# Patient Record
Sex: Female | Born: 1975 | Race: White | Hispanic: No | Marital: Married | Smoking: Never smoker
Health system: Southern US, Community
[De-identification: ages and names within clinical notes are randomized; demographics above are authoritative.]

## PROBLEM LIST (undated history)

## (undated) ENCOUNTER — Inpatient Hospital Stay (HOSPITAL_COMMUNITY): Payer: Self-pay

## (undated) DIAGNOSIS — F32A Depression, unspecified: Secondary | ICD-10-CM

## (undated) DIAGNOSIS — K219 Gastro-esophageal reflux disease without esophagitis: Secondary | ICD-10-CM

## (undated) DIAGNOSIS — D649 Anemia, unspecified: Secondary | ICD-10-CM

## (undated) DIAGNOSIS — G43909 Migraine, unspecified, not intractable, without status migrainosus: Secondary | ICD-10-CM

## (undated) DIAGNOSIS — J111 Influenza due to unidentified influenza virus with other respiratory manifestations: Secondary | ICD-10-CM

## (undated) DIAGNOSIS — IMO0001 Reserved for inherently not codable concepts without codable children: Secondary | ICD-10-CM

## (undated) DIAGNOSIS — R112 Nausea with vomiting, unspecified: Secondary | ICD-10-CM

## (undated) DIAGNOSIS — O4400 Placenta previa specified as without hemorrhage, unspecified trimester: Secondary | ICD-10-CM

## (undated) DIAGNOSIS — F419 Anxiety disorder, unspecified: Secondary | ICD-10-CM

## (undated) DIAGNOSIS — G950 Syringomyelia and syringobulbia: Secondary | ICD-10-CM

## (undated) DIAGNOSIS — K589 Irritable bowel syndrome without diarrhea: Secondary | ICD-10-CM

## (undated) DIAGNOSIS — F329 Major depressive disorder, single episode, unspecified: Secondary | ICD-10-CM

## (undated) DIAGNOSIS — B019 Varicella without complication: Secondary | ICD-10-CM

## (undated) DIAGNOSIS — J020 Streptococcal pharyngitis: Secondary | ICD-10-CM

## (undated) DIAGNOSIS — R9431 Abnormal electrocardiogram [ECG] [EKG]: Secondary | ICD-10-CM

## (undated) DIAGNOSIS — R87619 Unspecified abnormal cytological findings in specimens from cervix uteri: Secondary | ICD-10-CM

## (undated) DIAGNOSIS — R51 Headache: Secondary | ICD-10-CM

## (undated) DIAGNOSIS — Z9889 Other specified postprocedural states: Secondary | ICD-10-CM

## (undated) HISTORY — DX: Unspecified abnormal cytological findings in specimens from cervix uteri: R87.619

## (undated) HISTORY — DX: Irritable bowel syndrome, unspecified: K58.9

## (undated) HISTORY — DX: Migraine, unspecified, not intractable, without status migrainosus: G43.909

## (undated) HISTORY — PX: WISDOM TOOTH EXTRACTION: SHX21

## (undated) HISTORY — DX: Varicella without complication: B01.9

---
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
## 1997-10-18 ENCOUNTER — Encounter: Payer: Self-pay | Admitting: Emergency Medicine

## 1997-10-18 ENCOUNTER — Emergency Department (HOSPITAL_COMMUNITY): Admission: EM | Admit: 1997-10-18 | Discharge: 1997-10-18 | Payer: Self-pay | Admitting: Emergency Medicine

## 1998-03-11 ENCOUNTER — Other Ambulatory Visit: Admission: RE | Admit: 1998-03-11 | Discharge: 1998-03-11 | Payer: Self-pay | Admitting: Obstetrics and Gynecology

## 1998-09-29 ENCOUNTER — Other Ambulatory Visit: Admission: RE | Admit: 1998-09-29 | Discharge: 1998-09-29 | Payer: Self-pay | Admitting: Obstetrics and Gynecology

## 1998-11-18 ENCOUNTER — Other Ambulatory Visit: Admission: RE | Admit: 1998-11-18 | Discharge: 1998-11-18 | Payer: Self-pay | Admitting: *Deleted

## 1998-11-18 ENCOUNTER — Encounter (INDEPENDENT_AMBULATORY_CARE_PROVIDER_SITE_OTHER): Payer: Self-pay

## 1999-02-21 HISTORY — PX: WISDOM TOOTH EXTRACTION: SHX21

## 1999-03-10 ENCOUNTER — Other Ambulatory Visit: Admission: RE | Admit: 1999-03-10 | Discharge: 1999-03-10 | Payer: Self-pay | Admitting: *Deleted

## 1999-03-14 ENCOUNTER — Emergency Department (HOSPITAL_COMMUNITY): Admission: EM | Admit: 1999-03-14 | Discharge: 1999-03-15 | Payer: Self-pay | Admitting: *Deleted

## 1999-03-16 ENCOUNTER — Ambulatory Visit (HOSPITAL_COMMUNITY): Admission: RE | Admit: 1999-03-16 | Discharge: 1999-03-16 | Payer: Self-pay | Admitting: Emergency Medicine

## 1999-03-16 ENCOUNTER — Encounter: Payer: Self-pay | Admitting: Emergency Medicine

## 1999-04-08 ENCOUNTER — Encounter (INDEPENDENT_AMBULATORY_CARE_PROVIDER_SITE_OTHER): Payer: Self-pay

## 1999-04-08 ENCOUNTER — Other Ambulatory Visit: Admission: RE | Admit: 1999-04-08 | Discharge: 1999-04-08 | Payer: Self-pay | Admitting: *Deleted

## 1999-08-16 ENCOUNTER — Other Ambulatory Visit: Admission: RE | Admit: 1999-08-16 | Discharge: 1999-08-16 | Payer: Self-pay | Admitting: *Deleted

## 1999-08-16 ENCOUNTER — Encounter (INDEPENDENT_AMBULATORY_CARE_PROVIDER_SITE_OTHER): Payer: Self-pay | Admitting: Specialist

## 1999-08-16 ENCOUNTER — Other Ambulatory Visit: Admission: RE | Admit: 1999-08-16 | Discharge: 1999-08-16 | Payer: Self-pay | Admitting: Obstetrics and Gynecology

## 1999-10-07 ENCOUNTER — Encounter (INDEPENDENT_AMBULATORY_CARE_PROVIDER_SITE_OTHER): Payer: Self-pay

## 1999-10-07 ENCOUNTER — Other Ambulatory Visit: Admission: RE | Admit: 1999-10-07 | Discharge: 1999-10-07 | Payer: Self-pay | Admitting: Obstetrics and Gynecology

## 2000-02-09 ENCOUNTER — Other Ambulatory Visit: Admission: RE | Admit: 2000-02-09 | Discharge: 2000-02-09 | Payer: Self-pay | Admitting: Obstetrics and Gynecology

## 2000-10-01 ENCOUNTER — Other Ambulatory Visit: Admission: RE | Admit: 2000-10-01 | Discharge: 2000-10-01 | Payer: Self-pay | Admitting: Obstetrics and Gynecology

## 2001-02-19 ENCOUNTER — Other Ambulatory Visit: Admission: RE | Admit: 2001-02-19 | Discharge: 2001-02-19 | Payer: Self-pay | Admitting: Obstetrics and Gynecology

## 2002-08-01 ENCOUNTER — Emergency Department (HOSPITAL_COMMUNITY): Admission: EM | Admit: 2002-08-01 | Discharge: 2002-08-01 | Payer: Self-pay | Admitting: Emergency Medicine

## 2003-02-21 HISTORY — PX: APPENDECTOMY: SHX54

## 2003-05-12 ENCOUNTER — Ambulatory Visit (HOSPITAL_COMMUNITY): Admission: RE | Admit: 2003-05-12 | Discharge: 2003-05-12 | Payer: Self-pay | Admitting: Family Medicine

## 2003-05-12 ENCOUNTER — Emergency Department (HOSPITAL_COMMUNITY): Admission: AD | Admit: 2003-05-12 | Discharge: 2003-05-12 | Payer: Self-pay | Admitting: Family Medicine

## 2003-05-12 ENCOUNTER — Inpatient Hospital Stay (HOSPITAL_COMMUNITY): Admission: EM | Admit: 2003-05-12 | Discharge: 2003-05-13 | Payer: Self-pay | Admitting: Emergency Medicine

## 2003-05-12 ENCOUNTER — Encounter (INDEPENDENT_AMBULATORY_CARE_PROVIDER_SITE_OTHER): Payer: Self-pay | Admitting: *Deleted

## 2003-11-10 ENCOUNTER — Other Ambulatory Visit: Admission: RE | Admit: 2003-11-10 | Discharge: 2003-11-10 | Payer: Self-pay | Admitting: Obstetrics and Gynecology

## 2004-06-05 ENCOUNTER — Inpatient Hospital Stay (HOSPITAL_COMMUNITY): Admission: AD | Admit: 2004-06-05 | Discharge: 2004-06-05 | Payer: Self-pay | Admitting: Obstetrics and Gynecology

## 2004-06-09 ENCOUNTER — Inpatient Hospital Stay (HOSPITAL_COMMUNITY): Admission: AD | Admit: 2004-06-09 | Discharge: 2004-06-11 | Payer: Self-pay | Admitting: Obstetrics and Gynecology

## 2004-06-12 ENCOUNTER — Encounter: Admission: RE | Admit: 2004-06-12 | Discharge: 2004-07-12 | Payer: Self-pay | Admitting: Obstetrics and Gynecology

## 2004-07-21 ENCOUNTER — Other Ambulatory Visit: Admission: RE | Admit: 2004-07-21 | Discharge: 2004-07-21 | Payer: Self-pay | Admitting: Obstetrics and Gynecology

## 2005-12-01 ENCOUNTER — Ambulatory Visit: Payer: Self-pay | Admitting: Internal Medicine

## 2005-12-13 ENCOUNTER — Ambulatory Visit: Payer: Self-pay | Admitting: Internal Medicine

## 2005-12-13 LAB — CONVERTED CEMR LAB
Fecal Occult Blood: NEGATIVE
OCCULT 1: NEGATIVE
OCCULT 2: NEGATIVE
OCCULT 3: NEGATIVE
OCCULT 4: NEGATIVE
OCCULT 5: NEGATIVE

## 2006-01-23 ENCOUNTER — Ambulatory Visit: Payer: Self-pay | Admitting: Internal Medicine

## 2007-07-17 ENCOUNTER — Emergency Department (HOSPITAL_COMMUNITY): Admission: EM | Admit: 2007-07-17 | Discharge: 2007-07-17 | Payer: Self-pay | Admitting: Emergency Medicine

## 2007-11-02 ENCOUNTER — Emergency Department (HOSPITAL_COMMUNITY): Admission: EM | Admit: 2007-11-02 | Discharge: 2007-11-02 | Payer: Self-pay | Admitting: Emergency Medicine

## 2009-06-23 ENCOUNTER — Encounter: Admission: RE | Admit: 2009-06-23 | Discharge: 2009-08-25 | Payer: Self-pay | Admitting: Sports Medicine

## 2009-11-14 ENCOUNTER — Emergency Department (HOSPITAL_COMMUNITY): Admission: EM | Admit: 2009-11-14 | Discharge: 2009-11-14 | Payer: Self-pay | Admitting: Emergency Medicine

## 2009-11-16 ENCOUNTER — Other Ambulatory Visit (HOSPITAL_COMMUNITY): Admission: RE | Admit: 2009-11-16 | Discharge: 2009-12-02 | Payer: Self-pay | Admitting: Psychiatry

## 2009-11-23 ENCOUNTER — Ambulatory Visit: Payer: Self-pay | Admitting: Psychiatry

## 2009-12-07 ENCOUNTER — Ambulatory Visit (HOSPITAL_COMMUNITY): Payer: Self-pay | Admitting: Psychiatry

## 2009-12-20 ENCOUNTER — Ambulatory Visit (HOSPITAL_COMMUNITY): Payer: Self-pay | Admitting: Psychiatry

## 2009-12-28 ENCOUNTER — Ambulatory Visit (HOSPITAL_COMMUNITY): Payer: Self-pay | Admitting: Psychiatry

## 2010-01-11 ENCOUNTER — Ambulatory Visit (HOSPITAL_COMMUNITY): Payer: Self-pay | Admitting: Psychiatry

## 2010-01-21 ENCOUNTER — Ambulatory Visit (HOSPITAL_COMMUNITY): Payer: Self-pay | Admitting: Psychiatry

## 2010-01-26 ENCOUNTER — Ambulatory Visit (HOSPITAL_COMMUNITY): Payer: Self-pay | Admitting: Psychiatry

## 2010-02-20 DIAGNOSIS — O4400 Placenta previa specified as without hemorrhage, unspecified trimester: Secondary | ICD-10-CM

## 2010-02-20 HISTORY — DX: Complete placenta previa nos or without hemorrhage, unspecified trimester: O44.00

## 2010-03-04 ENCOUNTER — Ambulatory Visit (HOSPITAL_COMMUNITY)
Admission: RE | Admit: 2010-03-04 | Discharge: 2010-03-04 | Payer: Self-pay | Source: Home / Self Care | Attending: Psychiatry | Admitting: Psychiatry

## 2010-03-22 ENCOUNTER — Ambulatory Visit (HOSPITAL_COMMUNITY)
Admission: RE | Admit: 2010-03-22 | Discharge: 2010-03-22 | Payer: Self-pay | Source: Home / Self Care | Attending: Psychiatry | Admitting: Psychiatry

## 2010-03-25 ENCOUNTER — Encounter (HOSPITAL_COMMUNITY): Payer: Commercial Managed Care - PPO | Admitting: Psychiatry

## 2010-03-25 ENCOUNTER — Ambulatory Visit (HOSPITAL_COMMUNITY): Admit: 2010-03-25 | Payer: Self-pay | Admitting: Psychiatry

## 2010-03-25 DIAGNOSIS — F3289 Other specified depressive episodes: Secondary | ICD-10-CM

## 2010-03-25 DIAGNOSIS — F329 Major depressive disorder, single episode, unspecified: Secondary | ICD-10-CM

## 2010-04-01 ENCOUNTER — Encounter (HOSPITAL_COMMUNITY): Payer: Commercial Managed Care - PPO | Admitting: Psychiatry

## 2010-04-01 DIAGNOSIS — F331 Major depressive disorder, recurrent, moderate: Secondary | ICD-10-CM

## 2010-05-05 LAB — DIFFERENTIAL
Eosinophils Relative: 1 % (ref 0–5)
Lymphocytes Relative: 15 % (ref 12–46)
Lymphs Abs: 1.8 10*3/uL (ref 0.7–4.0)
Monocytes Absolute: 0.6 10*3/uL (ref 0.1–1.0)

## 2010-05-05 LAB — URINALYSIS, ROUTINE W REFLEX MICROSCOPIC
Bilirubin Urine: NEGATIVE
Glucose, UA: NEGATIVE mg/dL
Hgb urine dipstick: NEGATIVE
Ketones, ur: NEGATIVE mg/dL
Nitrite: NEGATIVE
Protein, ur: NEGATIVE mg/dL
Specific Gravity, Urine: 1.014 (ref 1.005–1.030)
Urobilinogen, UA: 0.2 mg/dL (ref 0.0–1.0)
pH: 5.5 (ref 5.0–8.0)

## 2010-05-05 LAB — CBC
HCT: 40.6 % (ref 36.0–46.0)
MCV: 87.4 fL (ref 78.0–100.0)
RBC: 4.64 MIL/uL (ref 3.87–5.11)
RDW: 14.1 % (ref 11.5–15.5)
WBC: 11.8 10*3/uL — ABNORMAL HIGH (ref 4.0–10.5)

## 2010-05-05 LAB — BASIC METABOLIC PANEL
BUN: 7 mg/dL (ref 6–23)
Chloride: 105 mEq/L (ref 96–112)
Potassium: 3.9 mEq/L (ref 3.5–5.1)

## 2010-05-05 LAB — RAPID URINE DRUG SCREEN, HOSP PERFORMED
Barbiturates: NOT DETECTED
Cocaine: NOT DETECTED
Opiates: NOT DETECTED

## 2010-05-05 LAB — ETHANOL: Alcohol, Ethyl (B): <5 mg/dL (ref 0–10)

## 2010-05-06 ENCOUNTER — Encounter (HOSPITAL_COMMUNITY): Payer: Commercial Managed Care - PPO | Admitting: Psychiatry

## 2010-05-06 DIAGNOSIS — F3289 Other specified depressive episodes: Secondary | ICD-10-CM

## 2010-05-06 DIAGNOSIS — F329 Major depressive disorder, single episode, unspecified: Secondary | ICD-10-CM

## 2010-05-13 ENCOUNTER — Encounter (HOSPITAL_COMMUNITY): Payer: Commercial Managed Care - PPO | Admitting: Psychiatry

## 2010-05-13 DIAGNOSIS — F3289 Other specified depressive episodes: Secondary | ICD-10-CM

## 2010-05-13 DIAGNOSIS — F329 Major depressive disorder, single episode, unspecified: Secondary | ICD-10-CM

## 2010-05-30 ENCOUNTER — Encounter (HOSPITAL_COMMUNITY): Payer: Commercial Managed Care - PPO | Admitting: Psychiatry

## 2010-05-30 DIAGNOSIS — F331 Major depressive disorder, recurrent, moderate: Secondary | ICD-10-CM

## 2010-06-21 LAB — RUBELLA ANTIBODY, IGM: Rubella: IMMUNE

## 2010-06-21 LAB — GC/CHLAMYDIA PROBE AMP, GENITAL: Chlamydia: NEGATIVE

## 2010-06-21 LAB — HEPATITIS B SURFACE ANTIGEN: Hepatitis B Surface Ag: NEGATIVE

## 2010-06-21 LAB — ABO/RH: RH Type: POSITIVE

## 2010-07-08 ENCOUNTER — Encounter (HOSPITAL_COMMUNITY): Payer: Commercial Managed Care - PPO | Admitting: Psychiatry

## 2010-07-08 NOTE — Assessment & Plan Note (Signed)
[facility]                           GASTROENTEROLOGY OFFICE NOTE   NAME:[name], [name]                     MRN:          [id number]  DATE:12/01/2005                            DOB:          [birth year]    REFERRING PHYSICIAN:  Guy Sandifer. Henderson Cloud, M.D.   REASON FOR CONSULTATION:  Chronic abdominal complaints and recent epigastric  pain.   HISTORY:  This is a 35 year old white female with history of anxiety and  depression who is referred through the courtesy of Dr. Henderson Cloud regarding  chronic as well as more recent gastrointestinal complaints.  First, the  patient reports a 10-year history of lower abdominal cramping followed by  urgency and loose stools.  Her problem seems to be exacerbated with stress  as well as certain foods particularly when she is dining away from home.  The problem seems to be somewhat random in that it can occur at any part of  the day.  It seems to be more prominent after the evening meal.  She can  experience episodes at night approximately once per month.  No associated  vomiting, bleeding, fevers or weight loss.  She does occasionally describe  the stools as greasy appearing.  She wondered about the possibility of  celiac disease.  She will occasionally take Imodium after a loose stool.  She has only taken the Imodium prophylactically on two occasions and this  has been helpful.  She has had no formal work-up.  She reports a three week  history of epigastric burning discomfort.  Associated with this has been  nausea and a fullness sensation.  No substernal burning or dysphagia.  Discomfort does not radiate to the side or back.  No urinary complaints.  She has been under a great deal of stress with her son who has seizure  disorder for which he is being evaluated in [place].  Patient does report having had thyroid testing earlier this year which was  normal.   PAST MEDICAL HISTORY:  Anxiety with  depression since 2001.   PAST SURGICAL HISTORY:  1. Breast reduction surgery.  2. Status post appendectomy.  3. Left knee arthroscopy.   ALLERGIES:  NO KNOWN DRUG ALLERGIES.   CURRENT MEDICATIONS:  1. Ambien 10 mg at night.  2. Yasmin.  3. Xanax 0.5 mg p.r.n.  4. Valtrex p.r.n.   FAMILY HISTORY:  Father with history of colon polyps.  No family history of  colon cancer.   SOCIAL HISTORY:  Patient is married with one son who was suffering with  seizure disorder.  Patient attended college and has a nursing degree.  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  Currently, however, she is not working so that she can  stay at home and attend to her child.  She does not smoke or use alcohol.   REVIEW OF SYSTEMS:  Per diagnostic evaluation form.   PHYSICAL EXAMINATION:  GENERAL APPEARANCE:  A well-appearing female in no  acute distress.  She is alert and oriented.  VITAL SIGNS:  Blood pressure  112/78, heart rate 60 and regular, weight 180.2  pounds.  She is 5 feet 8 inches in height.  HEENT:  Sclerae are anicteric.  Conjunctivae are pink.  Oral mucosa is  intact.  No adenopathy.  LUNGS:  Clear.  CARDIOVASCULAR:  Regular.  ABDOMEN:  Soft without tenderness, mass or hernia.  Good bowel sounds heard.  EXTREMITIES:  Without edema.  RECTAL:  Exam was deferred.   IMPRESSION:  1. This is a 35 year old female with chronic abdominal complaints      manifested by  postprandial and stress induced abdominal cramping with      urgency and loose stools that is consistent with irritable bowel.  No      alarm features.  Only two pieces of history somewhat inconsistent are      rare nocturnal component and a description of greasy stools.  2. Recent problems with epigastric burning discomfort.  Rule out ulcer,      rule out reflux equivalent, rule out functional dyspepsia.   RECOMMENDATIONS:  1. Screening laboratories including CBC, erythrocyte  sedimentation rate,      celiac sprue screening, and Hemoccult card.  2. Initiate empiric proton pump inhibitor therapy to see if epigastric      burning discomfort improves.  Protonix samples have been provided.  3. Levbid 0.35 mg p.o. b.i.d. cramping, urgency and loose stools.  4. Office follow-up in 4-6 weeks.            ______________________________  Wilhemina Bonito. Eda Keys., MD      JNP/MedQ  DD:  12/03/2005  DT:  12/04/2005  Job #:  [id number]   cc:   C. Duane Lope, M.D.

## 2010-07-08 NOTE — Op Note (Signed)
NAMEKIT, BRUBACHER                        ACCOUNT NO.:  [id number]   MEDICAL RECORD NO.:  [id number]                   PATIENT TYPE:  INP   LOCATION:  5705                                 FACILITY:  [facility]   PHYSICIAN:  Sandria Bales. Ezzard Standing, M.D.               DATE OF BIRTH:  [birth year]   DATE OF PROCEDURE:  05/12/2003  DATE OF DISCHARGE:  05/13/2003                                 OPERATIVE REPORT   PREOPERATIVE DIAGNOSIS:  Early appendicitis.   POSTOPERATIVE DIAGNOSIS:  Early appendicitis.   PROCEDURE:  Laparoscopic appendectomy.   SURGEON:  Sandria Bales. Ezzard Standing, M.D.   ANESTHESIA:  General endotracheal.   ESTIMATED BLOOD LOSS:  Minimal.   INDICATIONS FOR PROCEDURE:  Ms. Sebring is a 35 year old white female who  has had abdominal pain for about two days.  She had a CT scan today which  showed equivocal findings of appendicitis.  She has a normal white blood  count but does have right lower quadrant pain.   I have discussed with the patient the indications and potential  complications of proceeding with surgery versus continued observation, and  she wanted to proceed with surgery.  I discussed the potential complications  to include, but not limited to, bleeding, infection, not having appendicitis  or some other pathology, and needing open surgery.   The patient agreed with proceeding with appendectomy, taken to the operating  room where she underwent a general anesthetic supervised by Dr. Arta Bruce.  She was given one gram of cefotetan.   DESCRIPTION OF PROCEDURE:  Her abdomen was prepped with Betadine solution  and sterilely draped.  She had a Foley catheter in place.  An infraumbilical  incision was made, with sharp dissection carried down into the abdominal  cavity and 0 degree laparoscope was inserted through a 12 mm Hasson trocar,  and the Hasson trocar was secured with 0 Vicryl suture.  Two different  trocars were placed and 5 mm Ethicon trocar on the right subcostal  location  and a 10 mm, 11 mm Ethicon trocar in the left lower quadrant.  _________  trocar was infiltrated with 0.25% Marcaine using a total of about 10 cc.   I then first looked down at the uterus.  Her uterus was mildly red but had  no inflammation.  Both ovaries were unremarkable.  There was some blood down  the pelvis.  I do not find a ruptured cyst or evidence for ruptured cyst or  any fallopian abnormalities, or to the cecum, which was mobile.  She did  have an appendix, which looked distended and abnormal, and even though it is  very early I think she probably did have early appendicitis.  I went on to  divide the mesentery of the appendix using harmonic scalpel, divided the  base of the appendix with a 30 mm EndoGIA vascular stapler, delivered the  appendix through the umbilicus and Endocatch bag, and  sent it to pathology.   I then reinspected the staple line, irrigated that area, reinspected the  pelvis.  I ran the distal three or four feet of small bowel so no evidence  of Meckel's diverticulum, nor did I see any evidence of abnormality of the  right colon.  Her right and left lobes of the liver were unremarkable, as  was her stomach.  I then removed the trocars under direct visualization. The  umbilical trocar was closed with a 0 Vicryl suture.  The skin edge site was  closed with a 5-0 Vicryl suture, painted with a tincture of Benzoin and  Steri-Stripped.   The patient tolerated the procedure well and was transported to the recovery  room in good condition.  Sponge and needle count correct.                                               Sandria Bales. Ezzard Standing, M.D.    DHN/MEDQ  D:  05/12/2003  T:  05/14/2003  Job:  [id number]   cc:   Miguel Aschoff, M.D.  [address], [address]  [place]  [place] [address]  Fax: [phone]   Dr. Ventura Bruns of [facility]

## 2010-07-08 NOTE — Assessment & Plan Note (Signed)
[facility]                         GASTROENTEROLOGY OFFICE NOTE   NAME:Winfree, LESLIEANNE COBARRUBIAS                     MRN:          [id number]  DATE:01/23/2006                            DOB:          11-22-1975    HISTORY:  [name] presents today for followup. She is a 35 year old  female with anxiety with depression and chronic abdominal complaints who  was evaluated December 01, 2005 for chronic abdominal complaints as well  as recent problems with epigastric pain. See that dictation for details.  She underwent screening laboratories including CBC, erythrocyte  sedimentation rate, celiac sprue panel. These were normal. Also,  Hemoccult cards x6 were negative for blood. She was empirically placed  on a proton pump inhibitor in the form of Protonix. In addition  prescribed Levbid 0.375 mg b.i.d. She was asked to followup at this  time. Since her last visit, she reports marked improvement in symptoms.  After initiating proton pump inhibitor, she did continue with some  symptoms and nausea for about 1 week. However, there was progressive  improvement and by the end of 1 week those symptoms had resolved. She  continues on Protonix without problems. As for the lower GI complaints,  Levbid has been extremely helpful. No new problems or other issues. She  is pleased.   PHYSICAL EXAMINATION:  GENERAL:  A well-appearing female in no acute  distress.  VITAL SIGNS:  Blood pressure 112/66, heart rate is 84, weight is 184.4  pounds.  ABDOMEN:  Not reexamined.   IMPRESSION:  1. Recent problems with epigastric discomfort likely acid peptic in      origin. Probably reflux.  2. Chronic lower abdominal complaints consistent with irritable bowel.   RECOMMENDATIONS:  1. Continue Protonix.  2. Continue Levbid as needed.  3. GI followup p.r.n.  4. Resume general medical care with her regular physicians.     Wilhemina Bonito. Marina Goodell, MD  Electronically Signed    JNP/MedQ  DD:  01/23/2006  DT: 01/24/2006  Job #: [id number]   cc:   Guy Sandifer. Henderson Cloud, M.D.  Vianne Bulls, M.D.

## 2010-07-08 NOTE — H&P (Signed)
NAMEKALAH, PFLUM                        ACCOUNT NO.:  [id number]   MEDICAL RECORD NO.:  [id number]                   PATIENT TYPE:  INP   LOCATION:  5705                                 FACILITY:  [facility]   PHYSICIAN:  Sandria Bales. Ezzard Standing, M.D.               DATE OF BIRTH:  26-Aug-1975   DATE OF ADMISSION:  05/12/2003  DATE OF DISCHARGE:                                HISTORY & PHYSICAL   HISTORY OF PRESENT ILLNESS:  This is a 35 year old white female who, on  Sunday 10 May 2003, developed a right lower quadrant abdominal pain which  had been persistent over the last two days.  She has had some mild nausea,  no vomiting.  She did not have a thermometer and does not know if she has  had a fever.  She has had anorexia and has eaten poorly.  She went to [facility] yesterday.  Her white blood count was apparently normal.  She  was sent home and [facility], not the emergency  room, and was seen by Dr. Elvina Sidle.   He obtained a CT scan read by Dr. Colonel Bald, and there is a questionable  abnormal appendix.  It is a soft call whether this is appendicitis or just  the way the appendix is laying.  There was nothing else on the CT scan  suggesting abnormal problems.   The patient denies history of peptic ulcer disease, liver disease,  pancreatic disease. She has been diagnosed with irritable bowel syndrome  about five years ago. She mainly, however, watches her diet.  She has had no  radiologic or endoscopic tests to confirm there is no other problem.   PAST MEDICAL HISTORY:   ALLERGIES:  None.   MEDICATIONS:  Her only medications are birth control pills.   REVIEW OF SYSTEMS:  PULMONARY:  Does not smoke cigarettes.  No history of  pneumonia or tuberculosis.  CARDIAC:  No history of chest pain, angina,  cardiac evaluation.  GASTROINTESTINAL:  She History of Present Illness.  UROLOGIC:  No history of kidney infections.  GYN:  She had her last  period  on 19 March.  She has no children and has not been pregnant.   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  She  is accompanied by her husband in the emergency room.   PHYSICAL EXAMINATION:  VITAL SIGNS:  Temperature 98.2, blood pressure  122/72, pulse 83.  GENERAL: Well-nourished, pleasant white female, alert and cooperative.  She  is a little flushed in the face.  NECK:  Supple without mass or thyromegaly.  LUNGS:  Clear to auscultation.  HEART:  Regular rate and rhythm without murmur or rub.  ABDOMEN: Soft. She has active bowel sounds but may be decreased. She has  some mild tenderness in the right lower quadrant, but she really does not  have any peritoneal signs such as guarding or rebound.  RECTAL:  Not done.  EXTREMITIES:  Good strength in all four extremities.  NEUROLOGIC:  Grossly intact.   LABORATORY AND X-RAY DATA:  White blood count 10,000.   I have already described her CT scan.   IMPRESSION:  Possible early appendicitis.  I discussed the options with the  patient, continued observation versus proceeding to surgery.  I really  think, since she has had symptoms now persistent for two days; she has been  to the ER now twice; she has a CT scan which is equivocal, despite her other  normal labs, I think she would be best served by proceeding with a  laparoscopic appendectomy.  I told her the diagnosis could not be  appendicitis, but at least we could take her appendix out.  She actually  gave history of her mother at about her same age having abdominal problems  requiring abdominal exploration.                                                Sandria Bales. Ezzard Standing, M.D.    DHN/MEDQ  D:  05/12/2003  T:  05/12/2003  Job:  [id number]   cc:   C. Duane Lope, M.D.  [address]  [place]  [place] [address]  Fax: [phone]   Arnette Schaumann, M.D.  [facility]

## 2010-07-26 ENCOUNTER — Encounter (HOSPITAL_COMMUNITY): Payer: Commercial Managed Care - PPO | Admitting: Psychiatry

## 2010-07-26 DIAGNOSIS — F331 Major depressive disorder, recurrent, moderate: Secondary | ICD-10-CM

## 2010-08-04 ENCOUNTER — Inpatient Hospital Stay (HOSPITAL_COMMUNITY): Payer: Commercial Managed Care - PPO

## 2010-08-04 ENCOUNTER — Inpatient Hospital Stay (HOSPITAL_COMMUNITY)
Admission: AD | Admit: 2010-08-04 | Discharge: 2010-08-04 | Disposition: A | Discharge status: Home / Self Care | Payer: Commercial Managed Care - PPO | Source: Ambulatory Visit | Attending: Obstetrics and Gynecology | Admitting: Obstetrics and Gynecology

## 2010-08-04 DIAGNOSIS — O9989 Other specified diseases and conditions complicating pregnancy, childbirth and the puerperium: Secondary | ICD-10-CM

## 2010-08-04 DIAGNOSIS — O99891 Other specified diseases and conditions complicating pregnancy: Secondary | ICD-10-CM | POA: Insufficient documentation

## 2010-08-04 DIAGNOSIS — R109 Unspecified abdominal pain: Secondary | ICD-10-CM | POA: Insufficient documentation

## 2010-08-04 LAB — URINALYSIS, ROUTINE W REFLEX MICROSCOPIC
Bilirubin Urine: NEGATIVE
Hgb urine dipstick: NEGATIVE
Specific Gravity, Urine: 1.01 (ref 1.005–1.030)
pH: 6.5 (ref 5.0–8.0)

## 2010-08-04 LAB — CBC
Hemoglobin: 11.7 g/dL — ABNORMAL LOW (ref 12.0–15.0)
MCH: 29.8 pg (ref 26.0–34.0)
MCV: 88.3 fL (ref 78.0–100.0)
RBC: 3.92 MIL/uL (ref 3.87–5.11)

## 2010-12-20 ENCOUNTER — Other Ambulatory Visit: Payer: Self-pay | Admitting: Obstetrics and Gynecology

## 2010-12-22 ENCOUNTER — Encounter (HOSPITAL_COMMUNITY)
Admission: RE | Admit: 2010-12-22 | Discharge: 2010-12-22 | Disposition: A | Discharge status: Home / Self Care | Payer: 59 | Source: Ambulatory Visit | Attending: Obstetrics and Gynecology | Admitting: Obstetrics and Gynecology

## 2010-12-22 ENCOUNTER — Encounter (HOSPITAL_COMMUNITY): Payer: Self-pay

## 2010-12-22 HISTORY — DX: Headache: R51

## 2010-12-22 HISTORY — DX: Gastro-esophageal reflux disease without esophagitis: K21.9

## 2010-12-22 HISTORY — DX: Depression, unspecified: F32.A

## 2010-12-22 HISTORY — DX: Anemia, unspecified: D64.9

## 2010-12-22 HISTORY — DX: Other specified postprocedural states: R11.2

## 2010-12-22 HISTORY — DX: Nausea with vomiting, unspecified: Z98.890

## 2010-12-22 HISTORY — DX: Major depressive disorder, single episode, unspecified: F32.9

## 2010-12-22 HISTORY — DX: Complete placenta previa nos or without hemorrhage, unspecified trimester: O44.00

## 2010-12-22 LAB — CBC
HCT: 32.9 % — ABNORMAL LOW (ref 36.0–46.0)
MCHC: 32.5 g/dL (ref 30.0–36.0)
MCV: 89.4 fL (ref 78.0–100.0)
RDW: 13.7 % (ref 11.5–15.5)
WBC: 14.5 10*3/uL — ABNORMAL HIGH (ref 4.0–10.5)

## 2010-12-22 LAB — RPR: RPR Ser Ql: NONREACTIVE

## 2010-12-22 NOTE — Patient Instructions (Addendum)
   Your procedure is scheduled on: Tuesday, November 6th  Enter through the Main Entrance of [facility] at:11:30am Pick up the phone at the desk and dial [phone] and inform us of your arrival.  Please call this number if you have any problems the morning of surgery: [phone]  Remember: Do not eat food after midnight:Monday Do not drink clear liquids after 9am Tuesday morning, then nothing Take these medicines the morning of surgery with a SIP OF WATER:none  Do not wear jewelry, make-up, or FINGER nail polish Do not wear lotions, powders, or perfumes.  You may not  wear deodorant. Do not shave 48 hours prior to surgery. Do not bring valuables to the hospital.  Leave suitcase in the car. After Surgery it may be brought to your room. For patients being admitted to the hospital, checkout time is 11:00am the day of discharge.   Remember to use your hibiclens as instructed.Please shower with 1/2 bottle the evening before your surgery and the other 1/2 bottle the morning of surgery.

## 2010-12-27 ENCOUNTER — Encounter (HOSPITAL_COMMUNITY): Payer: Self-pay | Admitting: Anesthesiology

## 2010-12-27 ENCOUNTER — Encounter (HOSPITAL_COMMUNITY): Payer: Self-pay | Admitting: *Deleted

## 2010-12-27 ENCOUNTER — Inpatient Hospital Stay (HOSPITAL_COMMUNITY)
Admission: RE | Admit: 2010-12-27 | Discharge: 2010-12-30 | DRG: 765 | Disposition: A | Discharge status: Home / Self Care | Payer: 59 | Source: Ambulatory Visit | Attending: Obstetrics and Gynecology | Admitting: Obstetrics and Gynecology

## 2010-12-27 ENCOUNTER — Encounter (HOSPITAL_COMMUNITY)
Admission: RE | Disposition: A | Discharge status: Home / Self Care | Payer: Self-pay | Source: Ambulatory Visit | Attending: Obstetrics and Gynecology

## 2010-12-27 ENCOUNTER — Other Ambulatory Visit: Payer: Self-pay | Admitting: Obstetrics and Gynecology

## 2010-12-27 ENCOUNTER — Inpatient Hospital Stay (HOSPITAL_COMMUNITY): Payer: 59 | Admitting: Anesthesiology

## 2010-12-27 DIAGNOSIS — O4410 Placenta previa with hemorrhage, unspecified trimester: Principal | ICD-10-CM | POA: Diagnosis present

## 2010-12-27 DIAGNOSIS — Z01812 Encounter for preprocedural laboratory examination: Secondary | ICD-10-CM

## 2010-12-27 DIAGNOSIS — Z01818 Encounter for other preprocedural examination: Secondary | ICD-10-CM

## 2010-12-27 LAB — ABO/RH: ABO/RH(D): B POS

## 2010-12-27 SURGERY — Surgical Case
Anesthesia: Spinal | Site: Uterus | Wound class: Clean Contaminated

## 2010-12-27 MED ORDER — EPHEDRINE SULFATE 50 MG/ML IJ SOLN
INTRAMUSCULAR | Status: AC
  Filled 2010-12-27: qty 1

## 2010-12-27 MED ORDER — GLYCOPYRROLATE 0.2 MG/ML IJ SOLN
INTRAMUSCULAR | Status: AC
  Filled 2010-12-27: qty 1

## 2010-12-27 MED ORDER — FENTANYL CITRATE 0.05 MG/ML IJ SOLN
INTRAMUSCULAR | Status: DC | PRN
  Administered 2010-12-27: 25 ug via INTRATHECAL

## 2010-12-27 MED ORDER — DIBUCAINE 1 % RE OINT: 1.0000 "application " | TOPICAL_OINTMENT | RECTAL | Status: DC | PRN

## 2010-12-27 MED ORDER — CEFAZOLIN SODIUM 1-5 GM-% IV SOLN
INTRAVENOUS | Status: AC
  Filled 2010-12-27: qty 50

## 2010-12-27 MED ORDER — KETOROLAC TROMETHAMINE 30 MG/ML IJ SOLN
INTRAMUSCULAR | Status: AC
  Administered 2010-12-27: 30 mg via INTRAVENOUS
  Filled 2010-12-27: qty 1

## 2010-12-27 MED ORDER — MORPHINE SULFATE 0.5 MG/ML IJ SOLN
INTRAMUSCULAR | Status: AC
  Filled 2010-12-27: qty 10

## 2010-12-27 MED ORDER — LACTATED RINGERS IV SOLN
INTRAVENOUS | Status: DC
  Administered 2010-12-27 (×2): via INTRAVENOUS

## 2010-12-27 MED ORDER — MORPHINE SULFATE (PF) 0.5 MG/ML IJ SOLN
INTRAMUSCULAR | Status: DC | PRN
  Administered 2010-12-27: .15 mg via INTRATHECAL

## 2010-12-27 MED ORDER — EPHEDRINE SULFATE 50 MG/ML IJ SOLN
INTRAMUSCULAR | Status: DC | PRN
  Administered 2010-12-27: 10 mg via INTRAVENOUS
  Administered 2010-12-27: 25 mg via INTRAVENOUS
  Administered 2010-12-27: 10 mg via INTRAVENOUS
  Administered 2010-12-27: 5 mg via INTRAVENOUS

## 2010-12-27 MED ORDER — PHENYLEPHRINE HCL 10 MG/ML IJ SOLN
INTRAMUSCULAR | Status: DC | PRN
  Administered 2010-12-27: 40 ug via INTRAVENOUS
  Administered 2010-12-27: 80 ug via INTRAVENOUS
  Administered 2010-12-27 (×4): 40 ug via INTRAVENOUS
  Administered 2010-12-27: 80 ug via INTRAVENOUS

## 2010-12-27 MED ORDER — OXYTOCIN 20 UNITS IN LACTATED RINGERS INFUSION - SIMPLE
125.0000 mL/h | INTRAVENOUS | Status: AC
  Administered 2010-12-27: 125 mL/h via INTRAVENOUS
  Filled 2010-12-27: qty 1000

## 2010-12-27 MED ORDER — OXYCODONE-ACETAMINOPHEN 5-325 MG PO TABS
1.0000 | ORAL_TABLET | ORAL | Status: DC | PRN
  Administered 2010-12-28 – 2010-12-30 (×8): 1 via ORAL
  Filled 2010-12-27 (×8): qty 1

## 2010-12-27 MED ORDER — ZOLPIDEM TARTRATE 5 MG PO TABS: 5.0000 mg | ORAL_TABLET | Freq: Every evening | ORAL | Status: DC | PRN

## 2010-12-27 MED ORDER — SIMETHICONE 80 MG PO CHEW
80.0000 mg | CHEWABLE_TABLET | Freq: Three times a day (TID) | ORAL | Status: DC
  Administered 2010-12-27 – 2010-12-30 (×8): 80 mg via ORAL

## 2010-12-27 MED ORDER — PHENYLEPHRINE 40 MCG/ML (10ML) SYRINGE FOR IV PUSH (FOR BLOOD PRESSURE SUPPORT)
PREFILLED_SYRINGE | INTRAVENOUS | Status: AC
  Filled 2010-12-27: qty 10

## 2010-12-27 MED ORDER — WITCH HAZEL-GLYCERIN EX PADS: 1.0000 "application " | MEDICATED_PAD | CUTANEOUS | Status: DC | PRN

## 2010-12-27 MED ORDER — KETOROLAC TROMETHAMINE 30 MG/ML IJ SOLN
30.0000 mg | Freq: Once | INTRAMUSCULAR | Status: AC
  Administered 2010-12-27: 30 mg via INTRAVENOUS

## 2010-12-27 MED ORDER — LACTATED RINGERS IV SOLN
INTRAVENOUS | Status: DC
  Administered 2010-12-28: 01:00:00 via INTRAVENOUS

## 2010-12-27 MED ORDER — MEPERIDINE HCL 25 MG/ML IJ SOLN
6.2500 mg | INTRAMUSCULAR | Status: DC | PRN
  Administered 2010-12-27: 12.5 mg via INTRAVENOUS

## 2010-12-27 MED ORDER — TETANUS-DIPHTH-ACELL PERTUSSIS 5-2.5-18.5 LF-MCG/0.5 IM SUSP
0.5000 mL | Freq: Once | INTRAMUSCULAR | Status: DC
  Filled 2010-12-27: qty 0.5

## 2010-12-27 MED ORDER — DIPHENHYDRAMINE HCL 25 MG PO CAPS
25.0000 mg | ORAL_CAPSULE | Freq: Four times a day (QID) | ORAL | Status: DC | PRN
Start: 2010-12-27 — End: 2010-12-30

## 2010-12-27 MED ORDER — ONDANSETRON HCL 4 MG PO TABS: 4.0000 mg | ORAL_TABLET | ORAL | Status: DC | PRN

## 2010-12-27 MED ORDER — OXYTOCIN 10 UNIT/ML IJ SOLN
INTRAMUSCULAR | Status: AC
  Filled 2010-12-27: qty 2

## 2010-12-27 MED ORDER — CEFAZOLIN SODIUM 1-5 GM-% IV SOLN: 1.0000 g | INTRAVENOUS | Status: DC

## 2010-12-27 MED ORDER — MENTHOL 3 MG MT LOZG: 1.0000 | LOZENGE | OROMUCOSAL | Status: DC | PRN

## 2010-12-27 MED ORDER — PRENATAL PLUS 27-1 MG PO TABS
1.0000 | ORAL_TABLET | Freq: Every day | ORAL | Status: DC
  Administered 2010-12-28 – 2010-12-30 (×3): 1 via ORAL
  Filled 2010-12-27 (×3): qty 1

## 2010-12-27 MED ORDER — LANOLIN HYDROUS EX OINT: 1.0000 "application " | TOPICAL_OINTMENT | CUTANEOUS | Status: DC | PRN

## 2010-12-27 MED ORDER — SIMETHICONE 80 MG PO CHEW
80.0000 mg | CHEWABLE_TABLET | ORAL | Status: DC | PRN
  Administered 2010-12-28: 80 mg via ORAL

## 2010-12-27 MED ORDER — IBUPROFEN 600 MG PO TABS
600.0000 mg | ORAL_TABLET | Freq: Four times a day (QID) | ORAL | Status: DC
  Administered 2010-12-28 – 2010-12-30 (×11): 600 mg via ORAL
  Filled 2010-12-27 (×11): qty 1

## 2010-12-27 MED ORDER — MEPERIDINE HCL 25 MG/ML IJ SOLN
INTRAMUSCULAR | Status: AC
  Filled 2010-12-27: qty 1

## 2010-12-27 MED ORDER — OXYTOCIN 10 UNIT/ML IJ SOLN
INTRAMUSCULAR | Status: DC | PRN
  Administered 2010-12-27: 20 [IU] via INTRAMUSCULAR

## 2010-12-27 MED ORDER — FENTANYL CITRATE 0.05 MG/ML IJ SOLN
INTRAMUSCULAR | Status: AC
  Filled 2010-12-27: qty 2

## 2010-12-27 MED ORDER — ONDANSETRON HCL 4 MG/2ML IJ SOLN
INTRAMUSCULAR | Status: DC | PRN
  Administered 2010-12-27: 4 mg via INTRAVENOUS

## 2010-12-27 MED ORDER — SCOPOLAMINE 1 MG/3DAYS TD PT72
1.0000 | MEDICATED_PATCH | Freq: Once | TRANSDERMAL | Status: DC
  Administered 2010-12-27: 1.5 mg via TRANSDERMAL

## 2010-12-27 MED ORDER — HYDROMORPHONE HCL PF 1 MG/ML IJ SOLN: 0.2500 mg | INTRAMUSCULAR | Status: DC | PRN

## 2010-12-27 MED ORDER — ONDANSETRON HCL 4 MG/2ML IJ SOLN: 4.0000 mg | INTRAMUSCULAR | Status: DC | PRN

## 2010-12-27 MED ORDER — SENNOSIDES-DOCUSATE SODIUM 8.6-50 MG PO TABS
2.0000 | ORAL_TABLET | Freq: Every day | ORAL | Status: DC
  Administered 2010-12-27 – 2010-12-28 (×2): 2 via ORAL
  Administered 2010-12-29: 1 via ORAL

## 2010-12-27 MED ORDER — SCOPOLAMINE 1 MG/3DAYS TD PT72
MEDICATED_PATCH | TRANSDERMAL | Status: AC
  Administered 2010-12-27: 1.5 mg via TRANSDERMAL
  Filled 2010-12-27: qty 1

## 2010-12-27 SURGICAL SUPPLY — 28 items
CLOTH BEACON ORANGE TIMEOUT ST (SAFETY) ×2 IMPLANT
CONTAINER PREFILL 10% NBF 15ML (MISCELLANEOUS) IMPLANT
DRESSING TELFA 8X3 (GAUZE/BANDAGES/DRESSINGS) ×1 IMPLANT
DRSG PAD ABDOMINAL 8X10 ST (GAUZE/BANDAGES/DRESSINGS) ×1 IMPLANT
ELECT REM PT RETURN 9FT ADLT (ELECTROSURGICAL) ×2
ELECTRODE REM PT RTRN 9FT ADLT (ELECTROSURGICAL) ×1 IMPLANT
EXTRACTOR VACUUM M CUP 4 TUBE (SUCTIONS) IMPLANT
GAUZE SPONGE 4X4 12PLY STRL LF (GAUZE/BANDAGES/DRESSINGS) ×4 IMPLANT
GLOVE BIO SURGEON STRL SZ8 (GLOVE) ×4 IMPLANT
GOWN PREVENTION PLUS LG XLONG (DISPOSABLE) ×2 IMPLANT
KIT ABG SYR 3ML LUER SLIP (SYRINGE) ×2 IMPLANT
NDL HYPO 25X5/8 SAFETYGLIDE (NEEDLE) ×1 IMPLANT
NEEDLE HYPO 25X5/8 SAFETYGLIDE (NEEDLE) ×2 IMPLANT
NS IRRIG 1000ML POUR BTL (IV SOLUTION) ×2 IMPLANT
PACK C SECTION WH (CUSTOM PROCEDURE TRAY) ×2 IMPLANT
PAD ABD 7.5X8 STRL (GAUZE/BANDAGES/DRESSINGS) ×1 IMPLANT
SLEEVE SCD COMPRESS KNEE MED (MISCELLANEOUS) IMPLANT
SPONGE GAUZE 4X4 12PLY (GAUZE/BANDAGES/DRESSINGS) ×1 IMPLANT
STAPLER VISISTAT 35W (STAPLE) IMPLANT
STRIP CLOSURE SKIN 1/4X4 (GAUZE/BANDAGES/DRESSINGS) ×1 IMPLANT
SUT MNCRL 0 VIOLET CTX 36 (SUTURE) ×4 IMPLANT
SUT MONOCRYL 0 CTX 36 (SUTURE) ×4
SUT PDS AB 0 CTX 60 (SUTURE) ×2 IMPLANT
SUT PLAIN 0 NONE (SUTURE) IMPLANT
TAPE CLOTH SURG 4X10 WHT LF (GAUZE/BANDAGES/DRESSINGS) ×1 IMPLANT
TOWEL OR 17X24 6PK STRL BLUE (TOWEL DISPOSABLE) ×4 IMPLANT
TRAY FOLEY CATH 14FR (SET/KITS/TRAYS/PACK) ×2 IMPLANT
WATER STERILE IRR 1000ML POUR (IV SOLUTION) ×2 IMPLANT

## 2010-12-27 NOTE — Anesthesia Preprocedure Evaluation (Addendum)
Anesthesia Evaluation  Patient identified by MRN, date of birth, ID band Patient awake    Reviewed: Allergy & Precautions, H&P , Patient's Chart, lab work & pertinent test results  Airway Mallampati: II TM Distance: >3 FB Neck ROM: full    Dental No notable dental hx.    Pulmonary  clear to auscultation  Pulmonary exam normal       Cardiovascular Exercise Tolerance: Good regular Normal    Neuro/Psych    GI/Hepatic   Endo/Other    Renal/GU      Musculoskeletal   Abdominal   Peds  Hematology   Anesthesia Other Findings   Reproductive/Obstetrics                           Anesthesia Physical Anesthesia Plan  ASA: II  Anesthesia Plan: Spinal   Post-op Pain Management:    Induction:   Airway Management Planned:   Additional Equipment:   Intra-op Plan:   Post-operative Plan:   Informed Consent: I have reviewed the patients History and Physical, chart, labs and discussed the procedure including the risks, benefits and alternatives for the proposed anesthesia with the patient or authorized representative who has indicated his/her understanding and acceptance.   Dental Advisory Given  Plan Discussed with: CRNA  Anesthesia Plan Comments: (Lab work confirmed with CRNA in room. Platelets okay. Discussed spinal anesthetic, and patient consents to the procedure:  included risk of possible headache,backache, failed block, allergic reaction, and nerve injury. This patient was asked if she had any questions or concerns before the procedure started. )        Anesthesia Quick Evaluation  

## 2010-12-27 NOTE — Preoperative (Signed)
Beta Blockers   Reason not to administer Beta Blockers:Not Applicable 

## 2010-12-27 NOTE — Op Note (Signed)
NAMEMarleen, [name]              ACCOUNT NO.:  [id number]  MEDICAL RECORD NO.:  [id number]  LOCATION:  9131                          FACILITY:  [facility]  PHYSICIAN:  Guy Sandifer. Henderson Cloud, M.D. DATE OF BIRTH:  02-20-1976  DATE OF PROCEDURE:  12/27/2010 DATE OF DISCHARGE:                              OPERATIVE REPORT   PREOPERATIVE DIAGNOSIS:  Placenta previa.  POSTOPERATIVE DIAGNOSIS:  Placenta previa.  PROCEDURE:  Low-transverse cesarean section.  SURGEON:  Guy Sandifer. Henderson Cloud, MD  ASSISTANT:  Juluis Mire, MD  ANESTHESIA:  Spinal, Cristela Blue, MD  ESTIMATED BLOOD LOSS:  750 mL.  SPECIMENS:  Placenta to pathology.  FINDINGS:  Viable female infant.  INDICATIONS AND CONSENT:  This patient is a 35 year old married white female, G2, P1, with a placenta previa.  Last ultrasound showed the placenta to be about 6 mm from the cervical os.  Amniocentesis done yesterday had positive PG.  She is 36-1/2 weeks estimated gestational age.  Cesarean section was discussed with the patient.  Potential risks and complications were discussed preoperatively including but not limited to infection, organ damage, bleeding requiring transfusion of blood products with HIV and hepatitis acquisition, DVT, PE, and pneumonia.  All questions were answered and consent was signed on the chart.  PROCEDURE IN DETAIL:  The patient was taken to the operating room where she was identified, spinal anesthetic was placed, and she was placed in a dorsal supine position with a 15-degree left lateral wedge.  She was prepped abdominally and a Foley catheter was placed for the bladder to drain.  She was draped in a sterile fashion.  After testing for adequate spinal anesthesia, skin was entered through a Pfannenstiel incision and dissection was carried out in layers of the peritoneum.  Peritoneum was incised, extended superiorly and inferiorly.  There were large prominent veins over the lower uterine segment.  The uterus  was incised high on the lower uterine segment in a transverse manner.  The uterine cavity was entered bluntly with a hemostat and the uterine incision was extended cephalolaterally with the fingers.  Artificial rupture of membranes for clear fluid was carried out.  Vertex was delivered with a vacuum extractor to elevate it from the incision without difficulty. The baby was delivered and good cry and tone was noted.  Cord was clamped and cut.  The baby was handed to the awaiting pediatrics team. Placenta was manually delivered intact without difficulty and sent to pathology.  Uterine cavity was cleaned.  The uterus was closed in 2 running locking imbricating layers of 0-Monocryl suture which achieved good hemostasis.  Tubes and ovaries appeared normal bilaterally. Anterior peritoneum was closed in a running fashion with 0- Monocryl suture which was also used to reapproximate the pyramidalis muscle in the midline.  Anterior rectus fascia was closed in a running fashion with a 0-looped PDS suture and the skin was closed with clips. All sponge, instrument, and needle counts were correct and the patient was transferred to the recovery room in stable condition.     Guy Sandifer Henderson Cloud, M.D.     JET/MEDQ  D:  12/27/2010  T:  12/27/2010  Job:  [id number]

## 2010-12-27 NOTE — Progress Notes (Signed)
D/w pt cesarean section Examination unchanged All questions answered

## 2010-12-27 NOTE — Anesthesia Postprocedure Evaluation (Signed)
  Anesthesia Post-op Note  Patient: [name]  Procedure(s) Performed:  CESAREAN SECTION  Patient Location: PACU  Anesthesia Type: Spinal  Level of Consciousness: awake, alert  and oriented  Airway and Oxygen Therapy: Patient Spontanous Breathing  Post-op Pain: none  Post-op Assessment: Post-op Vital signs reviewed, Patient's Cardiovascular Status Stable, Respiratory Function Stable, Patent Airway, No signs of Nausea or vomiting, Pain level controlled, No headache and No backache  Post-op Vital Signs: Reviewed and stable  Complications: No apparent anesthesia complications

## 2010-12-27 NOTE — H&P (Signed)
[name] is a 35 y.o. female presenting for primary cesarean section due to placenta previa. No bleeding. Amnio 12/26/10 c/w L/S of 2.5 with positive PG. First child has cortical dysplasia. Maternal Medical History:  Fetal activity: Perceived fetal activity is normal.      OB History    Grav Para Term Preterm Abortions TAB SAB Ect Mult Living   2 1 1       1      Past Medical History  Diagnosis Date  . PONV (postoperative nausea and vomiting)   . Headache   . GERD (gastroesophageal reflux disease)     tums prn-with pregnancy only  . Placenta previa 2012    with current pregnancy-type and cross 2 Units per protocol  . Anemia   . Depression    Past Surgical History  Procedure Date  . Arthroscopic knee 1990  . Left breast reduction 1995  . Appendectomy 2005   Family History: family history is not on file. Social History:  reports that she has never smoked. She does not have any smokeless tobacco history on file. She reports that she drinks alcohol. She reports that she does not use illicit drugs.  Review of Systems  Eyes: Negative for blurred vision.  Neurological: Negative for headaches.      Last menstrual period 04/15/2010. Exam Physical Exam  Cardiovascular: Normal rate and regular rhythm.   Respiratory: Effort normal and breath sounds normal.  GI: There is no tenderness.  Neurological: She has normal reflexes.    Prenatal labs: ABO, Rh: B/Positive/-- (05/01 0000) Antibody: Negative (05/01 0000) Rubella: Immune (05/01 0000) RPR: NON REACTIVE (11/01 1200)  HBsAg: Negative (05/01 0000)  HIV: Non-reactive (05/01 0000)  GBS:     Assessment/Plan: 35 yo wf at 36 4/7 weeks with placenta previa (now 8mm from cervix) with mature amnio for primary cesarean section. Risks reviewed with patient.    Lasharn Bufkin II,Ervan Heber E 12/27/2010, 10:40 AM

## 2010-12-27 NOTE — Transfer of Care (Signed)
Immediate Anesthesia Transfer of Care Note  Patient: [name]  Procedure(s) Performed:  CESAREAN SECTION  Patient Location: PACU  Anesthesia Type: Spinal  Level of Consciousness: awake, alert  and oriented  Airway & Oxygen Therapy: Patient Spontanous Breathing and Patient connected to nasal cannula oxygen  Post-op Assessment: Report given to PACU RN and Post -op Vital signs reviewed and stable  Post vital signs: Reviewed and stable  Complications: No apparent anesthesia complications

## 2010-12-27 NOTE — Brief Op Note (Signed)
12/27/2010  1:45 PM  PATIENT:  [name]  35 y.o. female G2P1 with placenta previa and mature amnio  PRE-OPERATIVE DIAGNOSIS:  Placenta Previa  POST-OPERATIVE DIAGNOSIS:  Placenta Previa  PROCEDURE:  Procedure(s): CESAREAN SECTION  SURGEON:  Surgeon(s): Roselle Locus II John S McComb  PHYSICIAN ASSISTANT:   ASSISTANTS: none   ANESTHESIA:   spinal  EBL:  Total I/O In: -  Out: 1200 [Urine:500; Blood:700]  BLOOD ADMINISTERED:none  DRAINS: Urinary Catheter (Foley)   LOCAL MEDICATIONS USED:  NONE  SPECIMEN:  Source of Specimen:  Placenta  DISPOSITION OF SPECIMEN:  PATHOLOGY  COUNTS:  YES  TOURNIQUET:  * No tourniquets in log *  DICTATION: .Other Dictation: Dictation Number [id number]  PLAN OF CARE: Admit to inpatient   PATIENT DISPOSITION:  PACU - hemodynamically stable.   Delay start of Pharmacological VTE agent (>24hrs) due to surgical blood loss or risk of bleeding:  not applicable

## 2010-12-28 ENCOUNTER — Encounter (HOSPITAL_COMMUNITY): Payer: Self-pay | Admitting: Obstetrics and Gynecology

## 2010-12-28 LAB — CBC
HCT: 29.1 % — ABNORMAL LOW (ref 36.0–46.0)
MCHC: 33 g/dL (ref 30.0–36.0)
RDW: 13.8 % (ref 11.5–15.5)

## 2010-12-28 NOTE — Anesthesia Postprocedure Evaluation (Signed)
  Anesthesia Post-op Note  Patient: [name]  Procedure(s) Performed:  CESAREAN SECTION  Patient Location: Mother/Baby  Anesthesia Type: Spinal  Level of Consciousness: awake  Airway and Oxygen Therapy: Patient Spontanous Breathing  Post-op Pain: none  Post-op Assessment: Patient's Cardiovascular Status Stable, Respiratory Function Stable, Patent Airway, Adequate PO intake and Pain level controlled  Post-op Vital Signs: Reviewed and stable  Complications: No apparent anesthesia complications

## 2010-12-28 NOTE — Addendum Note (Signed)
Addendum  created 12/28/10 0926 by Suella Grove   Modules edited:Notes Section

## 2010-12-28 NOTE — Progress Notes (Signed)
Subjective: Postpartum Day 1: Cesarean Delivery Patient reports tolerating PO, + flatus and no problems voiding.    Objective: Vital signs in last 24 hours: Temp:  [98 F (36.7 C)-99.4 F (37.4 C)] 98.4 F (36.9 C) (11/07 0635) Pulse Rate:  [70-102] 70  (11/07 0635) Resp:  [16-30] 18  (11/07 0635) BP: (104-121)/(43-70) 110/63 mmHg (11/07 0635) SpO2:  [96 %-99 %] 99 % (11/07 1610)  Physical Exam:  General: alert and cooperative Lochia: appropriate Uterine Fundus: firm abd dressing CDI Foley discontinued, voiding without difficulty DVT Evaluation: No evidence of DVT seen on physical exam.   Basename 12/28/10 0535  HGB 9.6*  HCT 29.1*    Assessment/Plan: Status post Cesarean section. Doing well postoperatively.  Continue current care.  Nalany Steedley G 12/28/2010, 8:00 AM

## 2010-12-29 NOTE — Progress Notes (Signed)
Subjective: Postpartum Day 2: Cesarean Delivery Patient reports tolerating PO, + flatus and no problems voiding.    Objective: Vital signs in last 24 hours: Temp:  [97.3 F (36.3 C)-98.1 F (36.7 C)] 98.1 F (36.7 C) (11/08 0630) Pulse Rate:  [66-97] 70  (11/08 0630) Resp:  [16-18] 18  (11/08 0630) BP: (105-118)/(60-70) 110/70 mmHg (11/08 0630) SpO2:  [95 %-99 %] 99 % (11/07 1230)  Physical Exam:  General: alert and cooperative Lochia: appropriate Uterine Fundus: firm Incision: healing well DVT Evaluation: No evidence of DVT seen on physical exam.   Basename 12/28/10 0535  HGB 9.6*  HCT 29.1*    Assessment/Plan: Status post Cesarean section. Doing well postoperatively.  Continue current care.  Ruba Outen G 12/29/2010, 8:17 AM

## 2010-12-30 MED ORDER — IBUPROFEN 600 MG PO TABS: 600.0000 mg | ORAL_TABLET | Freq: Four times a day (QID) | ORAL | Status: AC

## 2010-12-30 MED ORDER — OXYCODONE-ACETAMINOPHEN 5-325 MG PO TABS: 1.0000 | ORAL_TABLET | ORAL | Status: AC | PRN

## 2010-12-30 NOTE — Discharge Summary (Signed)
Obstetric Discharge Summary Reason for Admission: cesarean section Prenatal Procedures: ultrasound Intrapartum Procedures: cesarean: low cervical, transverse Postpartum Procedures: none Complications-Operative and Postpartum: none Hemoglobin  Date Value Range Status  12/28/2010 9.6* 12.0-15.0 (g/dL) Final     HCT  Date Value Range Status  12/28/2010 29.1* 36.0-46.0 (%) Final    Discharge Diagnoses: Term Pregnancy-delivered  Discharge Information: Date: 12/30/2010 Activity: pelvic rest Diet: routine Medications: PNV, Ibuprofen and Percocet Condition: stable Instructions: refer to practice specific booklet Discharge to: home   Newborn Data: Live born female  Birth Weight: 6 lb 7.4 oz (2930 g) APGAR: 8, 9  Home with mother.  CURTIS,CAROL G 12/30/2010, 8:25 AM

## 2010-12-30 NOTE — Progress Notes (Signed)
Subjective: Postpartum Day 3: Cesarean Delivery Patient reports tolerating PO, + flatus and no problems voiding.    Objective: Vital signs in last 24 hours: Temp:  [97.4 F (36.3 C)-98.5 F (36.9 C)] 98.4 F (36.9 C) (11/09 0631) Pulse Rate:  [65-78] 65  (11/09 0631) Resp:  [18] 18  (11/09 0631) BP: (104-122)/(60-73) 104/60 mmHg (11/09 0631)  Physical Exam:  General: alert and cooperative Lochia: appropriate Uterine Fundus: firm Incision: healing well DVT Evaluation: No evidence of DVT seen on physical exam.   Basename 12/28/10 0535  HGB 9.6*  HCT 29.1*    Assessment/Plan: Status post Cesarean section. Doing well postoperatively.  Discharge home with standard precautions and return to clinic in 1-2 weeks.  CURTIS,CAROL G 12/30/2010, 8:16 AM

## 2010-12-31 LAB — TYPE AND SCREEN
ABO/RH(D): B POS
Antibody Screen: NEGATIVE
Unit division: 0

## 2012-06-26 ENCOUNTER — Observation Stay (HOSPITAL_BASED_OUTPATIENT_CLINIC_OR_DEPARTMENT_OTHER)
Admission: EM | Admit: 2012-06-26 | Discharge: 2012-06-27 | Disposition: A | Discharge status: Home / Self Care | Payer: 59 | Attending: Internal Medicine | Admitting: Internal Medicine

## 2012-06-26 ENCOUNTER — Emergency Department (HOSPITAL_COMMUNITY): Payer: 59

## 2012-06-26 ENCOUNTER — Encounter (HOSPITAL_BASED_OUTPATIENT_CLINIC_OR_DEPARTMENT_OTHER): Payer: Self-pay | Admitting: *Deleted

## 2012-06-26 DIAGNOSIS — D509 Iron deficiency anemia, unspecified: Secondary | ICD-10-CM

## 2012-06-26 DIAGNOSIS — D72829 Elevated white blood cell count, unspecified: Secondary | ICD-10-CM

## 2012-06-26 DIAGNOSIS — M6281 Muscle weakness (generalized): Secondary | ICD-10-CM | POA: Insufficient documentation

## 2012-06-26 DIAGNOSIS — M129 Arthropathy, unspecified: Secondary | ICD-10-CM | POA: Insufficient documentation

## 2012-06-26 DIAGNOSIS — M5030 Other cervical disc degeneration, unspecified cervical region: Secondary | ICD-10-CM | POA: Insufficient documentation

## 2012-06-26 DIAGNOSIS — F3289 Other specified depressive episodes: Secondary | ICD-10-CM | POA: Insufficient documentation

## 2012-06-26 DIAGNOSIS — D649 Anemia, unspecified: Secondary | ICD-10-CM | POA: Insufficient documentation

## 2012-06-26 DIAGNOSIS — K219 Gastro-esophageal reflux disease without esophagitis: Secondary | ICD-10-CM | POA: Insufficient documentation

## 2012-06-26 DIAGNOSIS — R262 Difficulty in walking, not elsewhere classified: Secondary | ICD-10-CM | POA: Insufficient documentation

## 2012-06-26 DIAGNOSIS — F329 Major depressive disorder, single episode, unspecified: Secondary | ICD-10-CM | POA: Insufficient documentation

## 2012-06-26 DIAGNOSIS — M7989 Other specified soft tissue disorders: Secondary | ICD-10-CM | POA: Insufficient documentation

## 2012-06-26 DIAGNOSIS — R9409 Abnormal results of other function studies of central nervous system: Secondary | ICD-10-CM | POA: Insufficient documentation

## 2012-06-26 DIAGNOSIS — R29898 Other symptoms and signs involving the musculoskeletal system: Secondary | ICD-10-CM

## 2012-06-26 DIAGNOSIS — R209 Unspecified disturbances of skin sensation: Principal | ICD-10-CM | POA: Insufficient documentation

## 2012-06-26 DIAGNOSIS — M5020 Other cervical disc displacement, unspecified cervical region: Secondary | ICD-10-CM | POA: Insufficient documentation

## 2012-06-26 DIAGNOSIS — R42 Dizziness and giddiness: Secondary | ICD-10-CM | POA: Insufficient documentation

## 2012-06-26 LAB — CBC WITH DIFFERENTIAL/PLATELET
Basophils Relative: 0 % (ref 0–1)
Eosinophils Absolute: 0.2 10*3/uL (ref 0.0–0.7)
Eosinophils Relative: 2 % (ref 0–5)
HCT: 35.7 % — ABNORMAL LOW (ref 36.0–46.0)
Hemoglobin: 12.3 g/dL (ref 12.0–15.0)
Lymphs Abs: 3.4 10*3/uL (ref 0.7–4.0)
MCH: 27.3 pg (ref 26.0–34.0)
MCHC: 34.5 g/dL (ref 30.0–36.0)
MCV: 79.2 fL (ref 78.0–100.0)
Monocytes Absolute: 0.7 10*3/uL (ref 0.1–1.0)
Monocytes Relative: 6 % (ref 3–12)
Neutrophils Relative %: 65 % (ref 43–77)
RBC: 4.51 MIL/uL (ref 3.87–5.11)

## 2012-06-26 LAB — POCT I-STAT, CHEM 8
Calcium, Ion: 1.16 mmol/L (ref 1.12–1.23)
Creatinine, Ser: 0.6 mg/dL (ref 0.50–1.10)
Glucose, Bld: 92 mg/dL (ref 70–99)
HCT: 39 % (ref 36.0–46.0)
Hemoglobin: 13.3 g/dL (ref 12.0–15.0)
TCO2: 23 mmol/L (ref 0–100)

## 2012-06-26 MED ORDER — GADOBENATE DIMEGLUMINE 529 MG/ML IV SOLN
19.0000 mL | Freq: Once | INTRAVENOUS | Status: AC
  Administered 2012-06-26: 19 mL via INTRAVENOUS

## 2012-06-26 NOTE — Consult Note (Signed)
NEURO HOSPITALIST CONSULT NOTE    Reason for Consult: bilateral LE and hands paresthesias/heaviness.  HPI:                                                                                                                                          [name] is an 37 y.o. female with a past medical history significant for depression, GERD, transferred to [facility] ED for further evaluation of bilateral LE and hands paresthesias/weakness. She stated that last Thursday she developed a burning, numb sensation in the left foot that the next day traveled to the left and right leg below the knee, and for the last 4 days have been associated with a sensation of heaviness and weakness of both legs as well as numbness of her hands. No falls, but it is hard to walk because her legs feel " like cement". No sensory symptoms or weakness involving her face. No bladder or bowel impairment, vertigo, double vision, difficulty breathing or swallowing. No slurred speech, language or vision impairment. [name] said that she was treated for bronchitis couple of weeks ago. Had blood work at her PCP's office which was reported as unremarkable. She decided to seek medical attention at [facility] ED today due to progressive worsening of her symptoms and was subsequently transferred to [facility] for further evaluation.    Past Medical History  Diagnosis Date  . PONV (postoperative nausea and vomiting)   . Headache   . GERD (gastroesophageal reflux disease)     tums prn-with pregnancy only  . Placenta previa 2012    with current pregnancy-type and cross 2 Units per protocol  . Anemia   . Depression     Past Surgical History  Procedure Laterality Date  . Arthroscopic knee  1990  . Left breast reduction  1995  . Appendectomy  2005  . Cesarean section  12/27/2010    Procedure: CESAREAN SECTION;  Surgeon: Roselle Locus II;  Location: [facility];  Service: Gynecology;  Laterality: N/A;     History reviewed. No pertinent family history.  Family History:  Social History:  reports that she has never smoked. She does not have any smokeless tobacco history on file. She reports that  drinks alcohol. She reports that she does not use illicit drugs.  No Known Allergies  MEDICATIONS:  I have reviewed the patient's current medications.   ROS:                                                                                                                                       History obtained from the patient  General ROS: negative for - chills, fatigue, fever, night sweats, weight gain or weight loss Psychological ROS: negative for - behavioral disorder, hallucinations, memory difficulties Ophthalmic ROS: negative for - blurry vision, double vision, eye pain or loss of vision ENT ROS: negative for - epistaxis, nasal discharge, oral lesions, sore throat, tinnitus or vertigo Allergy and Immunology ROS: negative for - hives or itchy/watery eyes Hematological and Lymphatic ROS: negative for - bleeding problems, bruising or swollen lymph nodes Endocrine ROS: negative for - galactorrhea, hair pattern changes, polydipsia/polyuria or temperature intolerance Respiratory ROS: negative for - cough, hemoptysis, shortness of breath or wheezing Cardiovascular ROS: negative for - chest pain, dyspnea on exertion, edema or irregular heartbeat Gastrointestinal ROS: negative for - abdominal pain, diarrhea, hematemesis, nausea/vomiting or stool incontinence Genito-Urinary ROS: negative for - dysuria, hematuria, incontinence or urinary frequency/urgency Musculoskeletal ROS: negative for - joint swelling Neurological ROS: as noted in HPI Dermatological ROS: negative for rash and skin lesion changes     Physical exam: pleasant female in no apparent distress. Blood pressure 131/78,  pulse 81, temperature 98.1 F (36.7 C), temperature source Oral, resp. rate 16, height 5\' 8"  (1.727 m), weight 90.719 kg (200 lb), last menstrual period 06/06/2012, SpO2 100.00%. Head: normocephalic. Neck: supple, no bruits, no JVD. Cardiac: no murmurs. Lungs: clear. Abdomen: soft, no tender, no mass. Extremities: no edema.   Neurologic Examination:                                                                                                      Mental Status: Alert, awake, oriented x 4, thought content appropriate.  Comprehension, naming, and repetition intact. Speech fluent without evidence of aphasia.  Able to follow 3 step commands without difficulty. Cranial Nerves: II: Discs flat bilaterally; Visual fields grossly normal, pupils equal, round, reactive to light and accommodation III,IV, VI: ptosis not present, extra-ocular motions intact bilaterally V,VII: smile symmetric, facial light touch sensation normal bilaterally VIII: hearing normal bilaterally IX,X: gag reflex present XI: bilateral shoulder shrug XII: midline tongue extension Motor: Mild weakness left upper extremity proximally, and ? perhaps mild weakness left LE proximal>distal  Tone and bulk:normal tone throughout; no atrophy noted Sensory: Pinprick and light touch intact  throughout, bilaterally Deep Tendon Reflexes: 2+ and symmetric throughout Plantars: Right: downgoing   Left: downgoing Cerebellar: normal finger-to-nose,  normal heel-to-shin test Gait: no ataxia. CV: pulses palpable throughout    No results found for this basename: cbc, bmp, coags, chol, tri, ldl, hga1c    No results found for this or any previous visit (from the past 48 hour(s)).  No results found.   Assessment/Plan: Pleasant 37 years old with progressive paresthesias bilateral LE and hands, associated with perhaps some weakness left LLE but intact DTR's and otherwise unimpressive neurological examination. Although GBS is in the  differential, she has essentially normal DTR's, and thus will initiate her neurological work up wth MRI brain and cervical spinal cord with and without contrast. LP if MRI is unrevealing. Neurology will continue to follow.  Wyatt Portela ,MD [facility] [phone]  06/26/2012, 9:18 PM

## 2012-06-26 NOTE — ED Provider Notes (Addendum)
37 year old female with a history of no significant past medical problems who states that she has had approximately one week of numbness and tingling in her bilateral lower extremities. She has no history of neck pain, back pain, prior surgery or cancer. She has not had any traumatic injuries. She was seen at her family Dr. and had normal labs and a normal d-dimer in the last several days. This started with a burning sensation to the bottom of one of her feet and has progressed into slight numbness and generalized weakness which has progressed from her feet up towards her knees. She now has some difficulty ambulating feeling like she is dragging her feet. She also has experienced bilateral tingling of her hands. She has no visual changes, no problems with speech and on my exam has left greater than right lower extremity weakness but is able to straight leg raise bilaterally. Sensation is reduced bialteral.  D/w on call neurology who will see in the ED.  I would consider GBS in ddx.  Discussed with hospitalist after neurology has seen the patient and recommends ongoing workup for etiologies as inpatient.  Vida Roller, MD 06/26/12 2049  Vida Roller, MD 06/26/12 [phone]

## 2012-06-26 NOTE — H&P (Addendum)
[facility] History and Physical  [name] ZOX:[id number] DOB: 1975-10-03 DOA: 06/26/2012   PCP: Gretel Acre, MD   Chief Complaint: lower extremity weakness and paresthesias  HPI:  37 year old female with GERD, anemia, depression presents with numbness and tingling in her lower extremities that began on 06/20/2012. The patient states that the burning and numbness sensation began on the sole of her left foot on 06/20/2012. By Monday, 06/24/2012, the patient stated that the numbness has progressed more proximally. By May 6, the numbness and heaviness involved both the patient's right lower extremity and left lower extremity..  In addition,she also noted some tingling in her bilateral fingertips. the patient also related some edema in her left lower extremity on May 3 and May 4 that subsequently resolved by 06/24/2012. The patient went to see her primary care physician on 06/25/2012. Apparently, BMP and CBC were unremarkable. Serum B12 level was marginally low at 338 and d-dimer was negative. Unfortunately, on the day of admission, the patient continued to complain of heaviness and numbness in her bilateral lower extremities up to her thighs. She continued to experience numbness and tingling in her bilateral fingers. Today, the patient's symptoms progressed resulting in her going to [facility] and then transferred to [facility] ED for further workup.  The patient had a recent URI type symptoms for which she took azithromycin 2 weeks ago. She still has a nonproductive cough.  The patient did have some chest discomfort over the weekend, but currently is pain-free. There has not been any fevers, chills, chest pain, shortness breath, nausea, vomiting, diarrhea. The patient denies any rashes, visual disturbance, dysarthria. In emergency department, labs were done and showed essentially unremarkable BMP. Labs to show WBC 12.5, hemoglobin 12.3 with MCV of 79.the patient was evaluated by neurology in the  emergency department. MRI of the cervical spine and brain were ordered. The results are pending at the time of my evaluation. Assessment/Plan: Sensory disturbancewith lower extremity weakness -MRI brain and cervical spine was discussed -Less likely to be a Guillain-Barre syndrome with preserved deep tendon reflexes -other considerations include transverse myelitis and subacute spinal degeneration particularly with low serum B12 level -Recheck serum B12 level, and if low, we'll supplement -Appreciate neurology evaluation -Check RBC folate, serum B12, iron studies -Check HIV -await MRI of the brain and cervical spine -Urine drug screen -check TSH -UA Leukocytosis -UA and urine culture -Chest x-ray Chest discomfort -Very low suspicion for ACS -Cycle  troponin and EKG Anemia, microcytic -Iron panel      Past Medical History  Diagnosis Date  . PONV (postoperative nausea and vomiting)   . Headache   . GERD (gastroesophageal reflux disease)     tums prn-with pregnancy only  . Placenta previa 2012    with current pregnancy-type and cross 2 Units per protocol  . Anemia   . Depression    Past Surgical History  Procedure Laterality Date  . Arthroscopic knee  1990  . Left breast reduction  1995  . Appendectomy  2005  . Cesarean section  12/27/2010    Procedure: CESAREAN SECTION;  Surgeon: Roselle Locus II;  Location: [facility]S;  Service: Gynecology;  Laterality: N/A;   Social History:  reports that she has never smoked. She does not have any smokeless tobacco history on file. She reports that  drinks alcohol. She reports that she does not use illicit drugs.   History reviewed. No pertinent family history.   No Known Allergies    Prior to Admission medications  Medication Sig Start Date End Date Taking? Authorizing Provider  Multiple Vitamin (MULTIVITAMIN WITH MINERALS) TABS Take 1 tablet by mouth daily.   Yes Historical Provider, MD    Review of Systems:   Constitutional:  No weight loss, night sweats, Fevers, chills, fatigue.  Head&Eyes: No headache.  No vision loss.  No eye pain or scotoma ENT:  No Difficulty swallowing,Tooth/dental problems,Sore throat,  No ear ache, post nasal drip,  Cardio-vascular:  No chest pain, Orthopnea, PND,  dizziness, palpitations  GI:  No  abdominal pain, nausea, vomiting, diarrhea, loss of appetite, hematochezia, melena, heartburn, indigestion, Resp:  No shortness of breath with exertion or at rest.  No coughing up of blood .No wheezing.No chest wall deformity  Skin:  no rash or lesions.  GU:  no dysuria, change in color of urine, no urgency or frequency. No flank pain.  Musculoskeletal:  Heaviness in the bilateral lower extremities with dysesthesias Psych:  No change in mood or affect. No depression or anxiety. Neurologic: No headache, no dysesthesia, no focal weakness, no vision loss. No syncope  Physical Exam: Filed Vitals:   06/26/12 1819 06/26/12 1950 06/26/12 2130  BP: 142/97 131/78 104/66  Pulse: 79 81 68  Temp: 98.8 F (37.1 C) 98.1 F (36.7 C)   TempSrc:  Oral   Resp: 16 16   Height: 5\' 8"  (1.727 m)    Weight: 90.719 kg (200 lb)    SpO2: 100% 100% 99%   General:  A&O x 3, NAD, nontoxic, pleasant/cooperative Head/Eye: No conjunctival hemorrhage, no icterus, [place]/AT, No nystagmus ENT:  No icterus,  No thrush, good dentition, no pharyngeal exudate Neck:  No masses, no lymphadenpathy, no bruits CV:  RRR, no rub, no gallop, no S3 Lung:  CTAB, good air movement, no wheeze, no rhonchi Abdomen: soft/NT, +BS, nondistended, no peritoneal signs Ext: No cyanosis, No rashes, No petechiae, No lymphangitis, trace edema Neuro: CNII-XII intact, strength 4/5 right upper extremity and right lower extremity. Strength 4-/5 left lower extremity.  DTR 2/4 right upper extremity and right lower extremity, 1/4 LUE,LLEi  Labs on Admission:  Basic Metabolic Panel:  Recent Labs Lab 06/26/12 2205  NA 140   K 3.8  CL 107  GLUCOSE 92  BUN 9  CREATININE 0.60   Liver Function Tests: No results found for this basename: AST, ALT, ALKPHOS, BILITOT, PROT, ALBUMIN,  in the last 168 hours No results found for this basename: LIPASE, AMYLASE,  in the last 168 hours No results found for this basename: AMMONIA,  in the last 168 hours CBC:  Recent Labs Lab 06/26/12 2150 06/26/12 2205  WBC 12.5*  --   NEUTROABS 8.1*  --   HGB 12.3 13.3  HCT 35.7* 39.0  MCV 79.2  --   PLT 378  --    Cardiac Enzymes: No results found for this basename: CKTOTAL, CKMB, CKMBINDEX, TROPONINI,  in the last 168 hours BNP: No components found with this basename: POCBNP,  CBG: No results found for this basename: GLUCAP,  in the last 168 hours  Radiological Exams on Admission: No results found.  EKG: Independently reviewed. pending    Time spent:70 minutes Code Status:   FULL Family Communication:   Husband at bedside   Tracy Gerken, DO  [facility] Pager [phone]  If 7PM-7AM, please contact night-coverage [email or web address] Password [facility] 06/26/2012, 11:21 PM

## 2012-06-26 NOTE — ED Notes (Signed)
Pt remains in MRI, husband in room

## 2012-06-26 NOTE — ED Provider Notes (Signed)
History     CSN: [id number]  Arrival date & time 06/26/12  1813   First MD Initiated Contact with Patient 06/26/12 1832      Chief Complaint  Patient presents with  . Leg Swelling    (Consider location/radiation/quality/duration/timing/severity/associated sxs/prior treatment) HPI Comments: Patient presents with complaints of progressive weakness over the past 4 days.  She tells me she woke up Saturday with a burning sensation in her heels, then felt as though her legs were swollen and heavy.  She was having a difficult time ambulating.  This seems to be worsening and is now involving the hands and arms.  Now all of her extremities feel weak and somewhat numb.  She denies any headache or neck pain.  There are no bowel or bladder complaints.  The husband is a PT/OT and believes her babinski reflexes are upgoing.  No fevers or chills although she did have a recent bronchitis / uri she has been treated for.    Patient is a 37 y.o. female presenting with weakness. The history is provided by the patient.  Weakness This is a new problem. Episode onset: 4 days. The problem occurs constantly. The problem has been gradually worsening. Nothing aggravates the symptoms. She has tried nothing for the symptoms.    Past Medical History  Diagnosis Date  . PONV (postoperative nausea and vomiting)   . Headache   . GERD (gastroesophageal reflux disease)     tums prn-with pregnancy only  . Placenta previa 2012    with current pregnancy-type and cross 2 Units per protocol  . Anemia   . Depression     Past Surgical History  Procedure Laterality Date  . Arthroscopic knee  1990  . Left breast reduction  1995  . Appendectomy  2005  . Cesarean section  12/27/2010    Procedure: CESAREAN SECTION;  Surgeon: Roselle Locus II;  Location: [facility];  Service: Gynecology;  Laterality: N/A;    History reviewed. No pertinent family history.  History  Substance Use Topics  . Smoking status: Never Smoker   .  Smokeless tobacco: Not on file  . Alcohol Use: 0.0 oz/week    0 drink(s) per week     Comment: rarely    OB History   Grav Para Term Preterm Abortions TAB SAB Ect Mult Living   2 2 1 1      2       Review of Systems  Neurological: Positive for weakness.  All other systems reviewed and are negative.    Allergies  Review of patient's allergies indicates no known allergies.  Home Medications   Current Outpatient Rx  Name  Route  Sig  Dispense  Refill  . beta carotene w/minerals (OCUVITE) tablet   Oral   Take 1 tablet by mouth daily.         . Fe Fum-FePoly-FA-Vit C-Vit B3 (INTEGRA F PO)   Oral   Take 1 tablet by mouth daily.           . prenatal vitamin w/FE, FA (PRENATAL 1 + 1) 27-1 MG TABS   Oral   Take 1 tablet by mouth daily.             BP 142/97  Pulse 79  Temp(Src) 98.8 F (37.1 C)  Resp 16  Ht 5\' 8"  (1.727 m)  Wt 200 lb (90.719 kg)  BMI 30.42 kg/m2  SpO2 100%  LMP 06/06/2012  Physical Exam  Nursing note and vitals reviewed. Constitutional:  She is oriented to person, place, and time. She appears well-developed and well-nourished. No distress.  HENT:  Head: Normocephalic and atraumatic.  Mouth/Throat: Oropharynx is clear and moist.  Neck: Normal range of motion. Neck supple.  Cardiovascular: Normal rate and regular rhythm.   No murmur heard. Pulmonary/Chest: Effort normal and breath sounds normal. No respiratory distress. She has no wheezes.  Abdominal: Soft. Bowel sounds are normal. She exhibits no distension. There is no tenderness.  Musculoskeletal: Normal range of motion. She exhibits no edema.  Lymphadenopathy:    She has no cervical adenopathy.  Neurological: She is alert and oriented to person, place, and time. No cranial nerve deficit. She exhibits abnormal muscle tone.  Reflex Scores:      Bicep reflexes are 1+ on the right side and 1+ on the left side.      Brachioradialis reflexes are 1+ on the right side and 1+ on the left side.       Patellar reflexes are 1+ on the right side and 1+ on the left side.      Achilles reflexes are 1+ on the right side and 1+ on the left side. Hand grip and bicep flexion are 4+/5.    Knee extension is 4+/5.  She is able to ambulate but appears somewhat slowed.  She has a difficult time attempting to ambulate on her toes.    Babinski reflexes are equivocal bilaterally.  Skin: Skin is warm and dry. She is not diaphoretic.    ED Course  Procedures (including critical care time)  Labs Reviewed - No data to display No results found.   No diagnosis found.    MDM  I have spoken with Dr. Thad Ranger regarding this patient.  As the symptoms appear to be progressing somewhat rapidly, she would like her to have an mri of the cervical spine before she goes home.  As this is not available here tonight, she will be transferred to [facility] ED for this mri and neurology consultation.  Dr. Lynelle Doctor aware and accepts patient in transfer.  Dr. Thad Ranger would also like to have a copper level, tsh performed.  As these cannot be performed here, I will defer this until she arrives at [facility].        Geoffery Lyons, MD 06/26/12 [phone]

## 2012-06-26 NOTE — ED Notes (Signed)
Verified with MRI that pt does not have HTN. Pt being transported to MRI. Pts rings given to husband

## 2012-06-26 NOTE — ED Notes (Addendum)
Pt reports bil leg and arm numbness x 4 days seen by PMD yesterday for same, labs drawn

## 2012-06-26 NOTE — ED Notes (Signed)
MD at bedside. 

## 2012-06-27 ENCOUNTER — Inpatient Hospital Stay (HOSPITAL_COMMUNITY): Payer: 59

## 2012-06-27 DIAGNOSIS — D509 Iron deficiency anemia, unspecified: Secondary | ICD-10-CM

## 2012-06-27 DIAGNOSIS — R29898 Other symptoms and signs involving the musculoskeletal system: Secondary | ICD-10-CM

## 2012-06-27 DIAGNOSIS — R209 Unspecified disturbances of skin sensation: Secondary | ICD-10-CM

## 2012-06-27 DIAGNOSIS — D72829 Elevated white blood cell count, unspecified: Secondary | ICD-10-CM

## 2012-06-27 LAB — TROPONIN I
Troponin I: <0.3 ng/mL (ref <0.30)
Troponin I: <0.3 ng/mL (ref <0.30)

## 2012-06-27 LAB — IRON AND TIBC: TIBC: 307 ug/dL (ref 250–470)

## 2012-06-27 LAB — COMPREHENSIVE METABOLIC PANEL
ALT: 22 U/L (ref 0–35)
Alkaline Phosphatase: 95 U/L (ref 39–117)
BUN: 9 mg/dL (ref 6–23)
CO2: 24 mEq/L (ref 19–32)
Chloride: 104 mEq/L (ref 96–112)
GFR calc Af Amer: >90 mL/min (ref >90)
GFR calc non Af Amer: >90 mL/min (ref >90)
Glucose, Bld: 128 mg/dL — ABNORMAL HIGH (ref 70–99)
Potassium: 3.8 mEq/L (ref 3.5–5.1)
Sodium: 138 mEq/L (ref 135–145)
Total Bilirubin: 0.4 mg/dL (ref 0.3–1.2)
Total Protein: 7.3 g/dL (ref 6.0–8.3)

## 2012-06-27 LAB — CREATININE, SERUM: Creatinine, Ser: 0.59 mg/dL (ref 0.50–1.10)

## 2012-06-27 LAB — URINALYSIS W MICROSCOPIC + REFLEX CULTURE
Bilirubin Urine: NEGATIVE
Leukocytes, UA: NEGATIVE
Nitrite: NEGATIVE
Specific Gravity, Urine: 1.016 (ref 1.005–1.030)
Urobilinogen, UA: 0.2 mg/dL (ref 0.0–1.0)
pH: 6 (ref 5.0–8.0)

## 2012-06-27 LAB — CBC
Hemoglobin: 12 g/dL (ref 12.0–15.0)
MCH: 27.1 pg (ref 26.0–34.0)
MCHC: 34.1 g/dL (ref 30.0–36.0)
MCHC: 33.7 g/dL (ref 30.0–36.0)
MCV: 79.5 fL (ref 78.0–100.0)
Platelets: 357 10*3/uL (ref 150–400)
RDW: 13.9 % (ref 11.5–15.5)
WBC: 9.1 10*3/uL (ref 4.0–10.5)

## 2012-06-27 LAB — TSH
TSH: 0.89 u[IU]/mL (ref 0.350–4.500)
TSH: 1.352 u[IU]/mL (ref 0.350–4.500)

## 2012-06-27 LAB — FOLATE: Folate: 16.8 ng/mL

## 2012-06-27 LAB — HEMOGLOBIN A1C: Hgb A1c MFr Bld: 5.5 % (ref <5.7)

## 2012-06-27 LAB — FERRITIN: Ferritin: 12 ng/mL (ref 10–291)

## 2012-06-27 LAB — VITAMIN B12: Vitamin B-12: 678 pg/mL (ref 211–911)

## 2012-06-27 LAB — RAPID URINE DRUG SCREEN, HOSP PERFORMED
Barbiturates: NOT DETECTED
Benzodiazepines: NOT DETECTED
Cocaine: NOT DETECTED
Tetrahydrocannabinol: NOT DETECTED

## 2012-06-27 MED ORDER — ACETAMINOPHEN 325 MG PO TABS
650.0000 mg | ORAL_TABLET | Freq: Four times a day (QID) | ORAL | Status: DC | PRN
  Administered 2012-06-27 (×2): 650 mg via ORAL
  Filled 2012-06-27 (×2): qty 2

## 2012-06-27 MED ORDER — ACETAMINOPHEN 650 MG RE SUPP: 650.0000 mg | Freq: Four times a day (QID) | RECTAL | Status: DC | PRN

## 2012-06-27 MED ORDER — ENOXAPARIN SODIUM 40 MG/0.4ML ~~LOC~~ SOLN
40.0000 mg | Freq: Every day | SUBCUTANEOUS | Status: DC
  Filled 2012-06-27: qty 0.4

## 2012-06-27 MED ORDER — ONDANSETRON HCL 4 MG PO TABS: 4.0000 mg | ORAL_TABLET | Freq: Four times a day (QID) | ORAL | Status: DC | PRN

## 2012-06-27 MED ORDER — ONDANSETRON HCL 4 MG/2ML IJ SOLN: 4.0000 mg | Freq: Four times a day (QID) | INTRAMUSCULAR | Status: DC | PRN

## 2012-06-27 NOTE — Evaluation (Signed)
Physical Therapy Evaluation Patient Details Name: VALARIA KOHUT MRN: [id number] DOB: 06/12/1975 Today's Date: 06/27/2012 Time: 2725-3664 PT Time Calculation (min): 45 min  PT Assessment / Plan / Recommendation Clinical Impression   37 yo adm after several day h/o increasing numbness, tingling, and weakness with distal to proximal progression (now including all 4 extremities, torso and face). Diagnostic testing thus far inconclusive for cause. Pt with increased severity of symptoms (now also including vertigo and visual changes) during PT evaluation. Emphasized safety with mobility, education related to use of RW (and possible need for shower chair), need to spread out her activities to avoid excessive fatigue, and need to notify RN/MD of any changes in her symptoms. Will benefit from further acute PT to continue above education.    PT Assessment  Patient needs continued PT services    Follow Up Recommendations  No PT follow up;Supervision/Assistance - 24 hour    Does the patient have the potential to tolerate intense rehabilitation      Barriers to Discharge None      Equipment Recommendations  None recommended by PT (may need shower seat (her spouse is an OT & can decide))    Recommendations for Other Services     Frequency Min 3X/week    Precautions / Restrictions Precautions Precautions: Fall   Pertinent Vitals/Pain Denied pain      Mobility  Bed Mobility Bed Mobility: Rolling Left;Left Sidelying to Sit;Sitting - Scoot to Edge of Bed Rolling Left: 6: Modified independent (Device/Increase time) Left Sidelying to Sit: 5: Supervision Sitting - Scoot to Edge of Bed: 5: Supervision Details for Bed Mobility Assistance: reported dizziness upon sitting, therefore requiring supervision Transfers Transfers: Sit to Stand;Stand to Sit Sit to Stand: 5: Supervision;With upper extremity assist Stand to Sit: 5: Supervision;With upper extremity assist Details for Transfer  Assistance: x2; slow movements, however steady; instructed in use of RW in case she feels weaker as day progresses Ambulation/Gait Ambulation/Gait Assistance: 4: Min guard Ambulation Distance (Feet): 150 Feet Assistive device: Rolling walker;None Ambulation/Gait Assistance Details: instructed in use of RW as pt reports she tends to feel weaker as the day goes on (pattern from previous 2 days). Gait very slow and deliberate with wide base of support. No knee buckling or LOB. Pt encouraged prior to walk and during walk  to monitor her symptoms and limit her activity if they were worsening. On return to room, she stated the numbness/heaviness had incr, she felt a spinning dizziness, and vision in rt eye was blurry. Gait Pattern: Step-through pattern;Decreased stride length;Decreased weight shift to right;Decreased weight shift to left;Wide base of support Gait velocity: <1.8 ft/sec indicative of incr fall risk    Exercises Other Exercises Other Exercises: Lengthy discussion re: possibly overtaxing her system and causing symptoms to increase. Instructed to monitor her activity tolerance and report to PT if she was feeling worse, however while walking she did not admit to worsening symptoms until she returned to her room and sat down. Re-emphasized the need to "listen to her body" and not push herself too much. Pt aware of several possible diagnoses MDs are trying to rule out and discussed possible concern for respiratory muscle involvement and need to notify RN if she feels she has any dyspnea.    PT Diagnosis: Difficulty walking;Generalized weakness  PT Problem List: Decreased strength;Decreased activity tolerance;Decreased balance;Decreased mobility;Decreased coordination;Decreased knowledge of use of DME;Decreased knowledge of precautions;Impaired sensation PT Treatment Interventions: DME instruction;Gait training;Stair training;Functional mobility training;Therapeutic activities;Balance  training;Patient/family education  PT Goals Acute Rehab PT Goals PT Goal Formulation: With patient Time For Goal Achievement: 07/01/12 Potential to Achieve Goals: Good Pt will go Supine/Side to Sit: with modified independence PT Goal: Supine/Side to Sit - Progress: Goal set today Pt will go Sit to Stand: with modified independence PT Goal: Sit to Stand - Progress: Goal set today Pt will Stand: with supervision;with no upper extremity support;Other (comment) (feet <shoulder width apart and eyes closed with no stepping) PT Goal: Stand - Progress: Goal set today Pt will Ambulate: 51 - 150 feet;with supervision;with least restrictive assistive device;Other (comment) (and no incr symptoms or loss of balance) PT Goal: Ambulate - Progress: Goal set today Additional Goals Additional Goal #1: Pt will be able to verbalize precaution related to monitoring response to activity and taking appropriate rest periods. PT Goal: Additional Goal #1 - Progress: Goal set today  Visit Information  Last PT Received On: 06/27/12 Assistance Needed: +1    Subjective Data  Subjective: Pt reports no tingling in extremities, now just numbness and heaaviness. She feels like she has to really concentrate to make her legs move when walking Patient Stated Goal: find out what is causing her symptoms; be able to care for her family   Prior Functioning  Home Living Lives With: Spouse;Son;Daughter (son 86 yo developmentally delayed (ambulatory), 18 mo dtr) Available Help at Discharge: Family;Available 24 hours/day Type of Home: House Home Access: Stairs to enter [employer] of Steps: 1 Home Layout: One level Bathroom Shower/Tub: Health visitor: Standard Home Adaptive Equipment: Environmental consultant - rolling (walker (her mother's)) Prior Function Level of Independence: Independent Able to Take Stairs?: Reciprically Driving: Yes Vocation: 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 Communication Communication: No difficulties Dominant Hand: Right    Cognition  Cognition Arousal/Alertness: Awake/alert Behavior During Therapy: WFL for tasks assessed/performed Overall Cognitive Status: Within Functional Limits for tasks assessed    Extremity/Trunk Assessment Right Upper Extremity Assessment RUE Sensation: Deficits RUE Sensation Deficits: decr light touch fingertips up to face Left Upper Extremity Assessment LUE Sensation: Deficits LUE Sensation Deficits: decr light touch fingertips up to face Right Lower Extremity Assessment RLE ROM/Strength/Tone: Deficits RLE ROM/Strength/Tone Deficits: AROM WFL; dorsiflexion 4+; PF 5; knee extension 4/5 RLE Sensation: Deficits RLE Sensation Deficits: describes numbness as 50% less than light touch on her clavicle; RLE Coordination: Deficits RLE Coordination Deficits: very slow movements (bradykinesia) Left Lower Extremity Assessment LLE ROM/Strength/Tone: Deficits LLE ROM/Strength/Tone Deficits: AROM WFL; dorsiflexion 5/5, PF 5/5, knee extension 4+ LLE Sensation: Deficits LLE Sensation Deficits: describes numbness as 75% less than light touch on her clavicle; LLE Coordination: Deficits LLE Coordination Deficits: very slow movements (bradykinesia) Trunk Assessment Trunk Assessment: Other exceptions Trunk Exceptions: weak with supine to sit   Balance Balance Balance Assessed: Yes Static Sitting Balance Static Sitting - Balance Support: No upper extremity supported;Feet supported Static Sitting - Level of Assistance: 5: Stand by assistance Static Sitting - Comment/# of Minutes: reports of dizziness lasting 60 seconds; no postural sway or LOB Static Standing Balance Static Standing - Balance Support: No upper extremity supported Static Standing - Level of Assistance: 5: Stand by assistance Static Standing - Comment/# of Minutes: + dizziness, spinning (no nystagmus observed)  End of Session PT - End of  Session Equipment Utilized During Treatment: Gait belt Activity Tolerance: Patient limited by fatigue Patient left: in chair;with call bell/phone within reach Nurse Communication: Mobility status;Other (comment) (incr symptoms with walking (see note))  GP Functional Assessment Tool Used: clinical judgement Functional  Limitation: Mobility: Walking and moving around Mobility: Walking and Moving Around Current Status [phone]): At least 20 percent but less than 40 percent impaired, limited or restricted Mobility: Walking and Moving Around Goal Status [phone]): At least 1 percent but less than 20 percent impaired, limited or restricted   Canuto Kingston 06/27/2012, 3:16 PM Pager [phone]

## 2012-06-27 NOTE — Progress Notes (Signed)
Physical Therapy Note  Eval completed at 12:30 with full note to follow. Spoke with pt's nurse, Morrie Sheldon, re: pt's reports of incr symptoms with activity. Prior to OOB, pt related that the numbness has moved up into her face and last night she noticed blurry vision in her Rt eye. After walking 150 ft, she reported she overall felt more fatigued, limbs felt heavier and more numb, vision in Rt eye was again blurry, and she had a spinning/dizzy sensation. No nystagmus noted. No dyspnea.    06/27/2012 Veda Canning, PT Pager: [phone]

## 2012-06-27 NOTE — Discharge Summary (Signed)
Physician Discharge Summary  CELISE BAZAR ZOX:[id number] DOB: 09/08/1975 DOA: 06/26/2012  PCP: Gretel Acre, MD  Admit date: 06/26/2012 Discharge date: 06/27/2012  Time spent: 40 minutes  Recommendations for Outpatient Follow-up:  1. Follow up with Neurology follow up labs MMA, B12, TSH and lyme titers. 2. Follow up with [facility] HIV.  Discharge Diagnoses:  Active Problems:   Sensory disturbance   Leg weakness   Leukocytosis   Microcytic anemia   Discharge Condition: stable  Diet recommendation: regular  Filed Weights   06/26/12 1819  Weight: 90.719 kg (200 lb)    History of present illness:  37 year old female with GERD, anemia, depression presents with numbness and tingling in her lower extremities that began on 06/20/2012. The patient states that the burning and numbness sensation began on the sole of her left foot on 06/20/2012. By Monday, 06/24/2012, the patient stated that the numbness has progressed more proximally. By May 6, the numbness and heaviness involved both the patient's right lower extremity and left lower extremity.. In addition,she also noted some tingling in her bilateral fingertips. the patient also related some edema in her left lower extremity on May 3 and May 4 that subsequently resolved by 06/24/2012. The patient went to see her primary care physician on 06/25/2012. Apparently, BMP and CBC were unremarkable. Serum B12 level was marginally low at 338 and d-dimer was negative. Unfortunately, on the day of admission, the patient continued to complain of heaviness and numbness in her bilateral lower extremities up to her thighs. She continued to experience numbness and tingling in her bilateral fingers. Today, the patient's symptoms progressed resulting in her going to [facility] and then transferred to [facility] ED for further workup.    Hospital Course:  Sensory disturbancewith lower extremity weakness  - MRI brain and cervical spine: no stroke or MS like. - Less likely to  be a Guillain-Barre syndrome with preserved deep tendon reflexes. - She relates her B12 was around 300-400 at MD office, she has a MCV of 80 and  RDW 13, which make B12 unlike will check and MMA. TSH WNL, UDS negative. - Serum B12 level as an outpatient. - consulted neurology to agree with lab work follow up as an outpatient with Neurology. - HIV pending follow up with PCP.- cardiac markers neg x 2  Leukocytosis  -UA and urine culture  -Chest x-ray  Anemia, microcytic  -Iron panel   Procedures:  MRI head c-spine 5.7.2014.  Consultations:  NEurology  Discharge Exam: Filed Vitals:   06/27/12 0055 06/27/12 0116 06/27/12 0527 06/27/12 1000  BP: 113/62 124/63 100/65 119/65  Pulse: 80 75 72 74  Temp:  98.4 F (36.9 C) 98.5 F (36.9 C) 98.4 F (36.9 C)  TempSrc:  Oral Oral Oral  Resp: 14 16 18 20   Height:      Weight:      SpO2: 98% 99% 99% 100%    General: A&O x3 Cardiovascular: RRR Respiratory: good air movement CTA B/L  Discharge Instructions  Discharge Orders   Future Orders Complete By Expires     Diet - low sodium heart healthy  As directed     Increase activity slowly  As directed         Medication List    TAKE these medications       multivitamin with minerals Tabs  Take 1 tablet by mouth daily.       No Known Allergies     Follow-up Information   Follow up with NNODI, ADAKU,  MD In 3 weeks. (hospital follow up)    Contact information:   [address] [place] [address] [phone]       Follow up with [facility] In 3 weeks. (hospital follow up)    Contact information:   [address] [place] [address] [phone]       The results of significant diagnostics from this hospitalization (including imaging, microbiology, ancillary and laboratory) are listed below for reference.    Significant Diagnostic Studies: Dg Chest 2 View  06/27/2012  *RADIOLOGY REPORT*  Clinical Data: - chest  discomfort, cough  CHEST - 2 VIEW  Comparison: None available  Findings: Normal heart size vascularity.  Mild nonspecific interstitial prominence.  No definite focal pneumonia, edema, collapse, consolidation, effusion or pneumothorax.  Trachea is midline.  No osseous abnormality.  IMPRESSION: Mild interstitial prominence.  No acute chest process.   Original Report Authenticated By: Judie Petit. Miles Costain, M.D.    Mr Laqueta Jean Wo Contrast  06/27/2012  *RADIOLOGY REPORT*  Clinical Data:  Bilateral upper and lower extremity tingling and weakness.  MRI HEAD WITHOUT AND WITH CONTRAST MRI CERVICAL SPINE WITHOUT AND WITH CONTRAST  Technique:  Multiplanar, multiecho pulse sequences of the brain and surrounding structures, and cervical spine, to include the craniocervical junction and cervicothoracic junction, were obtained without and with intravenous contrast.  Contrast: 19mL MULTIHANCE GADOBENATE DIMEGLUMINE 529 MG/ML IV SOLN  Comparison:   None.  MRI HEAD  Findings:  There is no evidence for acute infarction, intracranial hemorrhage, mass lesion, hydrocephalus, or extra-axial fluid. There is no evidence for cerebral or cerebellar atrophy.  There are no foci of chronic hemorrhage.  No areas of lacunar or large vessel infarction are seen.  In the left frontal periventricular white matter there is a single subcentimeter focus of signal abnormality which does not display restricted diffusion or post contrast enhancement. The morphology and location do not strongly suggest multiple sclerosis.  This likely represents an incidental remote ischemic or inflammatory event. These white matter lesions are generally considered incidental findings when few in number.  Following infusion of contrast, there is no abnormal enhancement of the brain or meninges.  No osseous findings are evident.  Flow voids are maintained in the major intracranial vessels.  Negative orbits.  Incidental sphenoid sinus disease.  No mastoid fluid.  IMPRESSION: Other than a  single likely incidental periventricular white matter lesion in the left frontal region, the study is unremarkable.  No acute intracranial findings are evident.  No abnormal post contrast enhancement.  MRI CERVICAL SPINE  Findings: Anatomic alignment is present.  The marrow signal is homogeneous.  There is slight disc space narrowing at C5-6 and C6- 7.  The cord is normal in size and signal.  There are no areas of abnormal post contrast enhancement.  No prevertebral soft tissue swelling is seen.  Craniocervical junction unremarkable.  The individual disc spaces were examined as follows:  C2-3:  Normal disc space.  Asymmetric facet arthropathy on the left does not narrow the foramen.  C3-4:  Normal disc space.  Mild left-sided facet arthropathy.  C4-5:  Normal disc space.  C5-6:  Disc space narrowing with right-sided uncinate spurring and shallow central protrusion.  Slight effacement anterior subarachnoid space without significant cord flattening.  No significant stenosis.  Right C6 nerve root encroachment is possible.  C6-7:  Shallow central and leftward protrusion.  Slight left-sided foraminal narrowing.  C7-T1:  Normal interspace.  IMPRESSION: Minor disc and facet pathology  as described.  No cord compression or areas of abnormal cord signal.   Original Report Authenticated By: Davonna Belling, M.D.    Mr Cervical Spine W Wo Contrast  06/27/2012  *RADIOLOGY REPORT*  Clinical Data:  Bilateral upper and lower extremity tingling and weakness.  MRI HEAD WITHOUT AND WITH CONTRAST MRI CERVICAL SPINE WITHOUT AND WITH CONTRAST  Technique:  Multiplanar, multiecho pulse sequences of the brain and surrounding structures, and cervical spine, to include the craniocervical junction and cervicothoracic junction, were obtained without and with intravenous contrast.  Contrast: 19mL MULTIHANCE GADOBENATE DIMEGLUMINE 529 MG/ML IV SOLN  Comparison:   None.  MRI HEAD  Findings:  There is no evidence for acute infarction, intracranial  hemorrhage, mass lesion, hydrocephalus, or extra-axial fluid. There is no evidence for cerebral or cerebellar atrophy.  There are no foci of chronic hemorrhage.  No areas of lacunar or large vessel infarction are seen.  In the left frontal periventricular white matter there is a single subcentimeter focus of signal abnormality which does not display restricted diffusion or post contrast enhancement. The morphology and location do not strongly suggest multiple sclerosis.  This likely represents an incidental remote ischemic or inflammatory event. These white matter lesions are generally considered incidental findings when few in number.  Following infusion of contrast, there is no abnormal enhancement of the brain or meninges.  No osseous findings are evident.  Flow voids are maintained in the major intracranial vessels.  Negative orbits.  Incidental sphenoid sinus disease.  No mastoid fluid.  IMPRESSION: Other than a single likely incidental periventricular white matter lesion in the left frontal region, the study is unremarkable.  No acute intracranial findings are evident.  No abnormal post contrast enhancement.  MRI CERVICAL SPINE  Findings: Anatomic alignment is present.  The marrow signal is homogeneous.  There is slight disc space narrowing at C5-6 and C6- 7.  The cord is normal in size and signal.  There are no areas of abnormal post contrast enhancement.  No prevertebral soft tissue swelling is seen.  Craniocervical junction unremarkable.  The individual disc spaces were examined as follows:  C2-3:  Normal disc space.  Asymmetric facet arthropathy on the left does not narrow the foramen.  C3-4:  Normal disc space.  Mild left-sided facet arthropathy.  C4-5:  Normal disc space.  C5-6:  Disc space narrowing with right-sided uncinate spurring and shallow central protrusion.  Slight effacement anterior subarachnoid space without significant cord flattening.  No significant stenosis.  Right C6 nerve root  encroachment is possible.  C6-7:  Shallow central and leftward protrusion.  Slight left-sided foraminal narrowing.  C7-T1:  Normal interspace.  IMPRESSION: Minor disc and facet pathology as described.  No cord compression or areas of abnormal cord signal.   Original Report Authenticated By: Davonna Belling, M.D.     Microbiology: No results found for this or any previous visit (from the past 240 hour(s)).   Labs: Basic Metabolic Panel:  Recent Labs Lab 06/26/12 2205 06/27/12 0123 06/27/12 0740  NA 140  --  138  K 3.8  --  3.8  CL 107  --  104  CO2  --   --  24  GLUCOSE 92  --  128*  BUN 9  --  9  CREATININE 0.60 0.59 0.59  CALCIUM  --   --  8.9   Liver Function Tests:  Recent Labs Lab 06/27/12 0740  AST PENDING  ALT 22  ALKPHOS 95  BILITOT 0.4  PROT 7.3  ALBUMIN 3.6   No results found for this basename: LIPASE, AMYLASE,  in the last 168 hours No results found for this basename: AMMONIA,  in the last 168 hours CBC:  Recent Labs Lab 06/26/12 2150 06/26/12 2205 06/27/12 0123 06/27/12 0740  WBC 12.5*  --  11.5* 9.1  NEUTROABS 8.1*  --   --   --   HGB 12.3 13.3 12.0 12.3  HCT 35.7* 39.0 35.2* 36.5  MCV 79.2  --  79.5 80.0  PLT 378  --  384 357   Cardiac Enzymes:  Recent Labs Lab 06/27/12 0123 06/27/12 0747  TROPONINI <0.30 <0.30   BNP: BNP (last 3 results) No results found for this basename: PROBNP,  in the last 8760 hours CBG: No results found for this basename: GLUCAP,  in the last 168 hours     Signed:  Marinda Elk  [facility] 06/27/2012, 10:51 AM

## 2012-06-27 NOTE — Progress Notes (Signed)
Pt c/o blurred vision in the right eye along with numbness to the face. Pt also ambulated with PT and while up walking felt like she was spinning and dizziness. Notified MD. Will continue to monitor Pt. Rema Fendt, RN

## 2012-06-27 NOTE — Progress Notes (Signed)
Subjective: Numbness continues.  MRI of the brain and cervical spine have been reviewed and were unremarkable.  CRP 0.7.  B12, HIV, TSH and folate unremarkable.    Objective: Current vital signs: BP 121/76  Pulse 83  Temp(Src) 98.1 F (36.7 C) (Oral)  Resp 18  Ht 5\' 8"  (1.727 m)  Wt 90.719 kg (200 lb)  BMI 30.42 kg/m2  SpO2 100%  LMP 06/06/2012  Breastfeeding? No Vital signs in last 24 hours: Temp:  [98 F (36.7 C)-98.8 F (37.1 C)] 98.1 F (36.7 C) (05/08 1624) Pulse Rate:  [68-89] 83 (05/08 1626) Resp:  [14-20] 18 (05/08 1626) BP: (100-142)/(55-97) 121/76 mmHg (05/08 1626) SpO2:  [97 %-100 %] 100 % (05/08 1626) Weight:  [90.719 kg (200 lb)] 90.719 kg (200 lb) (05/07 1819)  Intake/Output from previous day:   Intake/Output this shift:   Nutritional status: General  Neurologic Exam: Mental Status:  Alert, awake, oriented x 4, thought content appropriate. Comprehension, naming, and repetition intact. Speech fluent without evidence of aphasia. Able to follow 3 step commands without difficulty.  Cranial Nerves:  II: Discs flat bilaterally; Visual fields grossly normal, pupils equal, round, reactive to light and accommodation  III,IV, VI: ptosis not present, extra-ocular motions intact bilaterally  V,VII: smile symmetric, facial light touch sensation normal bilaterally  VIII: hearing normal bilaterally  IX,X: gag reflex present  XI: bilateral shoulder shrug  XII: midline tongue extension  Motor:  Mild weakness left upper extremity proximally, and ? perhaps mild weakness left LE proximal>distal  Tone and bulk:normal tone throughout; no atrophy noted  Sensory: Pinprick and light touch intact throughout, bilaterally  Deep Tendon Reflexes: 2+ and symmetric throughout  Plantars:  Right: downgoing  Left: downgoing  Cerebellar:  normal finger-to-nose, normal heel-to-shin test  Gait: no ataxia.  CV: pulses palpable throughout   Lab Results: Basic Metabolic  Panel:  Recent Labs Lab 06/26/12 2205 06/27/12 0123 06/27/12 0740  NA 140  --  138  K 3.8  --  3.8  CL 107  --  104  CO2  --   --  24  GLUCOSE 92  --  128*  BUN 9  --  9  CREATININE 0.60 0.59 0.59  CALCIUM  --   --  8.9    Liver Function Tests:  Recent Labs Lab 06/27/12 0740  AST 24  ALT 22  ALKPHOS 95  BILITOT 0.4  PROT 7.3  ALBUMIN 3.6   No results found for this basename: LIPASE, AMYLASE,  in the last 168 hours No results found for this basename: AMMONIA,  in the last 168 hours  CBC:  Recent Labs Lab 06/26/12 2150 06/26/12 2205 06/27/12 0123 06/27/12 0740  WBC 12.5*  --  11.5* 9.1  NEUTROABS 8.1*  --   --   --   HGB 12.3 13.3 12.0 12.3  HCT 35.7* 39.0 35.2* 36.5  MCV 79.2  --  79.5 80.0  PLT 378  --  384 357    Cardiac Enzymes:  Recent Labs Lab 06/27/12 0123 06/27/12 0747  TROPONINI <0.30 <0.30    Lipid Panel: No results found for this basename: CHOL, TRIG, HDL, CHOLHDL, VLDL, LDLCALC,  in the last 168 hours  CBG: No results found for this basename: GLUCAP,  in the last 168 hours  Microbiology: Results for orders placed during the hospital encounter of 12/22/10  SURGICAL PCR SCREEN     Status: None   Collection Time    12/22/10 11:48 AM  Result Value Range Status   MRSA, PCR NEGATIVE  NEGATIVE Final   Staphylococcus aureus NEGATIVE  NEGATIVE Final   Comment:            The Xpert SA Assay (FDA     approved for NASAL specimens     only), is one component of     a comprehensive surveillance     program.  It is not intended     to diagnose infection nor to     guide or monitor treatment.    Coagulation Studies: No results found for this basename: LABPROT, INR,  in the last 72 hours  Imaging: Dg Chest 2 View  06/27/2012  *RADIOLOGY REPORT*  Clinical Data: - chest discomfort, cough  CHEST - 2 VIEW  Comparison: None available  Findings: Normal heart size vascularity.  Mild nonspecific interstitial prominence.  No definite focal  pneumonia, edema, collapse, consolidation, effusion or pneumothorax.  Trachea is midline.  No osseous abnormality.  IMPRESSION: Mild interstitial prominence.  No acute chest process.   Original Report Authenticated By: Judie Petit. Miles Costain, M.D.    Mr Laqueta Jean Wo Contrast  06/27/2012  *RADIOLOGY REPORT*  Clinical Data:  Bilateral upper and lower extremity tingling and weakness.  MRI HEAD WITHOUT AND WITH CONTRAST MRI CERVICAL SPINE WITHOUT AND WITH CONTRAST  Technique:  Multiplanar, multiecho pulse sequences of the brain and surrounding structures, and cervical spine, to include the craniocervical junction and cervicothoracic junction, were obtained without and with intravenous contrast.  Contrast: 19mL MULTIHANCE GADOBENATE DIMEGLUMINE 529 MG/ML IV SOLN  Comparison:   None.  MRI HEAD  Findings:  There is no evidence for acute infarction, intracranial hemorrhage, mass lesion, hydrocephalus, or extra-axial fluid. There is no evidence for cerebral or cerebellar atrophy.  There are no foci of chronic hemorrhage.  No areas of lacunar or large vessel infarction are seen.  In the left frontal periventricular white matter there is a single subcentimeter focus of signal abnormality which does not display restricted diffusion or post contrast enhancement. The morphology and location do not strongly suggest multiple sclerosis.  This likely represents an incidental remote ischemic or inflammatory event. These white matter lesions are generally considered incidental findings when few in number.  Following infusion of contrast, there is no abnormal enhancement of the brain or meninges.  No osseous findings are evident.  Flow voids are maintained in the major intracranial vessels.  Negative orbits.  Incidental sphenoid sinus disease.  No mastoid fluid.  IMPRESSION: Other than a single likely incidental periventricular white matter lesion in the left frontal region, the study is unremarkable.  No acute intracranial findings are evident.  No  abnormal post contrast enhancement.  MRI CERVICAL SPINE  Findings: Anatomic alignment is present.  The marrow signal is homogeneous.  There is slight disc space narrowing at C5-6 and C6- 7.  The cord is normal in size and signal.  There are no areas of abnormal post contrast enhancement.  No prevertebral soft tissue swelling is seen.  Craniocervical junction unremarkable.  The individual disc spaces were examined as follows:  C2-3:  Normal disc space.  Asymmetric facet arthropathy on the left does not narrow the foramen.  C3-4:  Normal disc space.  Mild left-sided facet arthropathy.  C4-5:  Normal disc space.  C5-6:  Disc space narrowing with right-sided uncinate spurring and shallow central protrusion.  Slight effacement anterior subarachnoid space without significant cord flattening.  No significant stenosis.  Right C6 nerve root encroachment is possible.  C6-7:  Shallow central and leftward protrusion.  Slight left-sided foraminal narrowing.  C7-T1:  Normal interspace.  IMPRESSION: Minor disc and facet pathology as described.  No cord compression or areas of abnormal cord signal.   Original Report Authenticated By: Davonna Belling, M.D.    Mr Cervical Spine W Wo Contrast  06/27/2012  *RADIOLOGY REPORT*  Clinical Data:  Bilateral upper and lower extremity tingling and weakness.  MRI HEAD WITHOUT AND WITH CONTRAST MRI CERVICAL SPINE WITHOUT AND WITH CONTRAST  Technique:  Multiplanar, multiecho pulse sequences of the brain and surrounding structures, and cervical spine, to include the craniocervical junction and cervicothoracic junction, were obtained without and with intravenous contrast.  Contrast: 19mL MULTIHANCE GADOBENATE DIMEGLUMINE 529 MG/ML IV SOLN  Comparison:   None.  MRI HEAD  Findings:  There is no evidence for acute infarction, intracranial hemorrhage, mass lesion, hydrocephalus, or extra-axial fluid. There is no evidence for cerebral or cerebellar atrophy.  There are no foci of chronic hemorrhage.  No  areas of lacunar or large vessel infarction are seen.  In the left frontal periventricular white matter there is a single subcentimeter focus of signal abnormality which does not display restricted diffusion or post contrast enhancement. The morphology and location do not strongly suggest multiple sclerosis.  This likely represents an incidental remote ischemic or inflammatory event. These white matter lesions are generally considered incidental findings when few in number.  Following infusion of contrast, there is no abnormal enhancement of the brain or meninges.  No osseous findings are evident.  Flow voids are maintained in the major intracranial vessels.  Negative orbits.  Incidental sphenoid sinus disease.  No mastoid fluid.  IMPRESSION: Other than a single likely incidental periventricular white matter lesion in the left frontal region, the study is unremarkable.  No acute intracranial findings are evident.  No abnormal post contrast enhancement.  MRI CERVICAL SPINE  Findings: Anatomic alignment is present.  The marrow signal is homogeneous.  There is slight disc space narrowing at C5-6 and C6- 7.  The cord is normal in size and signal.  There are no areas of abnormal post contrast enhancement.  No prevertebral soft tissue swelling is seen.  Craniocervical junction unremarkable.  The individual disc spaces were examined as follows:  C2-3:  Normal disc space.  Asymmetric facet arthropathy on the left does not narrow the foramen.  C3-4:  Normal disc space.  Mild left-sided facet arthropathy.  C4-5:  Normal disc space.  C5-6:  Disc space narrowing with right-sided uncinate spurring and shallow central protrusion.  Slight effacement anterior subarachnoid space without significant cord flattening.  No significant stenosis.  Right C6 nerve root encroachment is possible.  C6-7:  Shallow central and leftward protrusion.  Slight left-sided foraminal narrowing.  C7-T1:  Normal interspace.  IMPRESSION: Minor disc and  facet pathology as described.  No cord compression or areas of abnormal cord signal.   Original Report Authenticated By: Davonna Belling, M.D.     Medications:  I have reviewed the patient's current medications. Scheduled: . enoxaparin (LOVENOX) injection  40 mg Subcutaneous Daily    Assessment/Plan: 37 year old female with paresthesias.  DTR's preserved.  Symptoms stable since admission.  Imaging unremarkable.  Initial lab results unremarkable as well.   Recommendations: 1.  To lab work will add heavy metal screen, copper, ESR, lyme titer, A1c, vitamin B1, vitamin D, SPEP and HTLV. 2.  Patient may need to follow up some of these lab results as an outpatient.  May have NCV/EMG as  an outpatient as well.    Case discussed with Dr. David Stall   LOS: 1 day   Thana Farr, MD [facility] [phone] 06/27/2012  5:11 PM

## 2012-06-28 LAB — CERULOPLASMIN: Ceruloplasmin: 35 mg/dL (ref 20–60)

## 2012-07-01 LAB — IMMUNOFIXATION ELECTROPHORESIS
IgA: 253 mg/dL (ref 69–380)
IgG (Immunoglobin G), Serum: 1160 mg/dL (ref 690–1700)

## 2012-07-01 LAB — HTLV I+II ANTIBODIES, (EIA), BLD: HTLV I/II Ab: NONREACTIVE

## 2012-07-03 LAB — VITAMIN B1: Vitamin B1 (Thiamine): 12 nmol/L (ref 8–30)

## 2012-07-04 LAB — VITAMIN D 1,25 DIHYDROXY: Vitamin D 1, 25 (OH)2 Total: 85 pg/mL — ABNORMAL HIGH (ref 18–72)

## 2012-07-09 ENCOUNTER — Encounter: Payer: Self-pay | Admitting: Neurology

## 2012-07-09 ENCOUNTER — Ambulatory Visit (INDEPENDENT_AMBULATORY_CARE_PROVIDER_SITE_OTHER): Payer: 59 | Admitting: Neurology

## 2012-07-09 VITALS — BP 120/89 | HR 77 | Ht 67.0 in | Wt 204.0 lb

## 2012-07-09 DIAGNOSIS — R200 Anesthesia of skin: Secondary | ICD-10-CM

## 2012-07-09 DIAGNOSIS — R209 Unspecified disturbances of skin sensation: Secondary | ICD-10-CM

## 2012-07-09 LAB — MISCELLANEOUS TEST

## 2012-07-09 NOTE — Progress Notes (Signed)
[name] is a 37 years old right-handed Caucasian female, followup from her most recent hospital discharge, she is accompanied by her husband, who is occupational therapist,  She was admitted to the hospital in May seventh, she complains of numbness from bilateral feet, ascending to her bilateral thigh, leading to her hospital admission,she had extensive evaluation,MRI of the brain was normal, with exception of one isolated small vessel disease at the left frontal subcortical region, MRI of the cervical showed bulge degenerative disc disease, most severe at C5-6, C6 and 7, but no significant canal or foraminal stenosis,  Extensive laboratory evaluation, including normal CBC, low-normal ferritin 12, low iron level 29, normal TIBC, rest of the laboratory evaluation was normal: Or Negative, including TSH, vitamin B12, ceruloplamsin, Lyme titer, ESR, folic acid, A1c, vitamin D, C. reactive protein, HTLV-1, 2, protein electrophoresis, CMP, troponin, UA, UDS,  She denies significant gait difficulty, but she continued to complain left leg numbness from mid thigh and down, stocking-like, she also complains of right chin numbness, overwhelming sensation in the crowded environment, difficulty focusing   She has chronic stress, her 67 years old child has special-needs, she also has 23-month-old at home  Review of Systems  Out of a complete 14 system review, the patient complains of only the following symptoms, and all other reviewed systems are negative.   Constitutional:   fatigue Cardiovascular:  N/A Ear/Nose/Throat:  N/A Skin: N/A Eyes: N/A Respiratory: N/A Gastroitestinal: N/A    Hematology/Lymphatic:  N/A Endocrine:  N/A Musculoskeletal:N/A Allergy/Immunology: N/A Neurological: numbness, dizziness, weakness Psychiatric:    Decreased energy  PHYSICAL EXAMINATOINS:  Generalized: In no acute distress  Neck: Supple, no carotid bruits   Cardiac: Regular rate rhythm  Pulmonary: Clear to  auscultation bilaterally  Musculoskeletal: No deformity  Neurological examination  Mentation: Alert oriented to time, place, history taking, and causual conversation, tearful  Cranial nerve II-XII: Pupils were equal round reactive to light extraocular movements were full, visual field were full on confrontational test. facial sensation and strength were normal. hearing was intact to finger rubbing bilaterally. Uvula tongue midline.  head turning and shoulder shrug and were normal and symmetric.Tongue protrusion into cheek strength was normal.  Motor: normal tone, bulk and strength.  Sensory: Intact to fine touch, pinprick, preserved vibratory sensation, and proprioception at toes.  Coordination: Normal finger to nose, heel-to-shin bilaterally there was no truncal ataxia  Gait: Rising up from seated position without assistance, normal stance, without trunk ataxia, moderate stride, good arm swing, smooth turning, able to perform tiptoe, and heel walking without difficulty.   Romberg signs: Negative  Deep tendon reflexes: Brachioradialis 2/2, biceps 2/2, triceps 2/2, patellar 2/2, Achilles 2/2, plantar responses were flexor bilaterally.  Assessment and plan: 37 years old Caucasian female, with constellation of complaints, including ascending numbness, overwhelming in a crowded environment, normal neurological examination  1, she had extensive imaging, and the laboratory evaluation, only abnormality is low ferritin level, iron level, hemoglobin is on the low-normal side, 2. have suggested iron supplement, 3 continue to observe her symptoms, return to clinic in 6 months

## 2012-07-19 ENCOUNTER — Encounter: Payer: Self-pay | Admitting: Family

## 2012-07-19 ENCOUNTER — Ambulatory Visit (INDEPENDENT_AMBULATORY_CARE_PROVIDER_SITE_OTHER): Payer: 59 | Admitting: Family

## 2012-07-19 VITALS — BP 120/60 | HR 87 | Temp 98.0°F | Ht 68.0 in | Wt 207.8 lb

## 2012-07-19 DIAGNOSIS — F329 Major depressive disorder, single episode, unspecified: Secondary | ICD-10-CM | POA: Insufficient documentation

## 2012-07-19 DIAGNOSIS — F32A Depression, unspecified: Secondary | ICD-10-CM | POA: Insufficient documentation

## 2012-07-19 DIAGNOSIS — R209 Unspecified disturbances of skin sensation: Secondary | ICD-10-CM

## 2012-07-19 DIAGNOSIS — R202 Paresthesia of skin: Secondary | ICD-10-CM | POA: Insufficient documentation

## 2012-07-19 DIAGNOSIS — F3289 Other specified depressive episodes: Secondary | ICD-10-CM

## 2012-07-19 MED ORDER — ESCITALOPRAM OXALATE 10 MG PO TABS: 10.0000 mg | ORAL_TABLET | Freq: Every day | ORAL | Status: DC

## 2012-07-19 NOTE — Assessment & Plan Note (Addendum)
Unchanged.  Hospital records reviewed and are unrevealing.  Pt would like second opinion.  Wants to see Dr. Epimenio Foot.  Will arrange.

## 2012-07-19 NOTE — Patient Instructions (Addendum)
Lexapro- start 1/2 tab by mouth once daily x 1 week, then increase to a full tab on week two. You will be contacted about your referral to neurology. Please let us know if you have not heard back within 1 week about your referral. Follow up in 1 month. Welcome to [facility]!

## 2012-07-19 NOTE — Assessment & Plan Note (Addendum)
Deteriorated.  I also think that she has an anxiety component. Has done well in the past with lexapro. Plan trial of lexapro. Lexapro- I instructed pt to start 1/2 tablet once daily for 1 week and then increase to a full tablet once daily on week two as tolerated.  We discussed common side effects such as nausea, drowsiness and weight gain.  Also discussed rare but serious side effect of suicide ideation.  She is instructed to discontinue medication go directly to ED if this occurs.  Pt verbalizes understanding.  Plan follow up in 1 month to evaluate progress.

## 2012-07-19 NOTE — Progress Notes (Signed)
Subjective:    Patient ID: [name], female    DOB: 1975-06-16, 37 y.o.   MRN: [id number]  HPI  [name] is a 37 yr odl female who presents today to establish care.    Reports burning sole of left foot sole of left foot.  Then developed weakness.  She denies associated back pain. Was admitted to [facility].  Had extensive work up including MRI brain and C spine which was unremarkable.  Records are reviewed.  Pt reports that since she was hospitalized she has started some sublingual b12 and iron.  She reports that she has poor energy.  She continues to have numbness in the left leg >R.  Since she was discharged she notes that she  Feels very distracted due to crowds.  Has trouble focusing in large busy crowds.  She notes that she still has some weakness in the left leg.  Feels like she does not have enough strength to push up.  She gets tingling occasionally in the fingertips which started a few days after the burning sensation in the left foot.   Depression- Reports that she had some situational depression after her father passed away in 07-11-99.  Son is aged 75 and has special needs.  She was treated with lexapro in 2010-07-11.  Would like to go back on the lexapro.  Has trouble falling asleep, mind races.  Has trouble getting up in the AM due to poor energy. Denies panic attacks.   GERD- only with pregnancy.  Now resolved.    Migraines- Reports rare migraines which manifest as optical migraines.  Resolve with ibuprofen and sleep. Occurs every few months.    Kidney stones- Reports that she had a stone 07-10-2008 which she passed on her own.   Review of Systems  Constitutional:       Reports that since the fall she has stopped exercising.  Has gained 20-30 pounds in last 6 months.  HENT: Negative for congestion.   Eyes: Negative for visual disturbance.  Respiratory: Negative for shortness of breath.   Cardiovascular: Negative for leg swelling.  Gastrointestinal: Negative for nausea, vomiting and  diarrhea.  Genitourinary:       Reports irregular menses, has heavy bleeding on the second day.  Musculoskeletal: Negative for myalgias and arthralgias.  Skin: Negative for rash.  Neurological: Positive for headaches.  Hematological: Negative for adenopathy.  Psychiatric/Behavioral:       See HPI   Past Medical History  Diagnosis Date  . PONV (postoperative nausea and vomiting)   . Headache(784.0)   . GERD (gastroesophageal reflux disease)     tums prn-with pregnancy only  . Placenta previa 07-11-10    with current pregnancy-type and cross 2 Units per protocol  . Anemia   . Depression   . Migraines   . IBS (irritable bowel syndrome)   . Chicken pox     History   Social History  . Marital Status: Married    Spouse Name: N/A    Number of Children: N/A  . Years of Education: N/A   Occupational History  . Not on file.   Social History Main Topics  . Smoking status: Never Smoker   . Smokeless tobacco: Never Used  . Alcohol Use: 1.2 oz/week    2 Glasses of wine per week     Comment: rarely  . Drug Use: No  . Sexually Active: Yes   Other Topics Concern  . Not on file   Social History Narrative  Patient lives at home with her husband Fayrene Fearing). Patient works with Administrator, sports, desk job. 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   Daughter born 2012   Son 2006 (special needs)   Married    Past Surgical History  Procedure Laterality Date  . Arthroscopic knee  1990  . Left breast reduction  1996  . Appendectomy  2005  . Cesarean section  12/27/2010    Procedure: CESAREAN SECTION;  Surgeon: Roselle Locus II;  Location: [facility];  Service: Gynecology;  Laterality: N/A;    Family History  Problem Relation Age of Onset  . High blood pressure Mother   . Arthritis Mother   . Hypertension Mother   . High blood pressure Brother   . Alcohol abuse Father   . Arthritis Father     No Known Allergies  Current Outpatient Prescriptions on File Prior to Visit  Medication Sig Dispense  Refill  . Multiple Vitamin (MULTIVITAMIN WITH MINERALS) TABS Take 1 tablet by mouth daily.       No current facility-administered medications on file prior to visit.    BP 120/60  Pulse 87  Temp(Src) 98 F (36.7 C) (Oral)  Ht 5\' 8"  (1.727 m)  Wt 207 lb 12 oz (94.235 kg)  BMI 31.6 kg/m2  SpO2 98%  LMP 07/02/2012       Objective:   Physical Exam  Constitutional: She is oriented to person, place, and time. She appears well-developed and well-nourished. No distress.  Cardiovascular: Normal rate and regular rhythm.   No murmur heard. Pulmonary/Chest: Effort normal and breath sounds normal. No respiratory distress. She has no wheezes. She has no rales. She exhibits no tenderness.  Neurological: She is alert and oriented to person, place, and time.  Reflex Scores:      Patellar reflexes are 2+ on the right side and 2+ on the left side. EOM intact Bilateral UE/LE strength 5/5  Psychiatric: Her behavior is normal. Judgment and thought content normal.  Became tearful during interview          Assessment & Plan:

## 2012-08-16 ENCOUNTER — Ambulatory Visit: Payer: 59 | Admitting: Family

## 2012-08-19 ENCOUNTER — Encounter: Payer: Self-pay | Admitting: Family

## 2012-08-19 ENCOUNTER — Ambulatory Visit (INDEPENDENT_AMBULATORY_CARE_PROVIDER_SITE_OTHER): Payer: 59 | Admitting: Family

## 2012-08-19 VITALS — BP 110/70 | HR 74 | Temp 98.7°F | Resp 16 | Ht 68.0 in | Wt 205.1 lb

## 2012-08-19 DIAGNOSIS — F329 Major depressive disorder, single episode, unspecified: Secondary | ICD-10-CM

## 2012-08-19 DIAGNOSIS — R29898 Other symptoms and signs involving the musculoskeletal system: Secondary | ICD-10-CM

## 2012-08-19 DIAGNOSIS — F32A Depression, unspecified: Secondary | ICD-10-CM

## 2012-08-19 DIAGNOSIS — F3289 Other specified depressive episodes: Secondary | ICD-10-CM

## 2012-08-19 MED ORDER — ESCITALOPRAM OXALATE 10 MG PO TABS
10.0000 mg | ORAL_TABLET | Freq: Every day | ORAL | Status: DC
Start: 2012-08-19 — End: 2012-11-26

## 2012-08-19 MED ORDER — ZOLPIDEM TARTRATE 5 MG PO TABS: 5.0000 mg | ORAL_TABLET | Freq: Every evening | ORAL | Status: DC | PRN

## 2012-08-19 NOTE — Progress Notes (Signed)
Subjective:    Patient ID: [name], female    DOB: 1975-02-26, 37 y.o.   MRN: [id number]  HPI  [name] is a 37 yr old female who presents today for follow up of her depression. She was started back on lexapro last visit. Reports that since that time, her anxiety is better controlled. She continues to have difficulty sleeping however.   Leg Numbness/weakness- Pt saw Dr. 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 Was told she was outside of the window of treatment with steroids.  Neuro felt that this could been viral in nature but could also be an early presentation of MS. The pt is contemplating LP, but hasn't made a decision yet if she wishes to confirm formal MS diagnosis at this time. She has follow up arranged with Dr. Craige Cotta.  Review of Systems See HPI  Past Medical History  Diagnosis Date  . PONV (postoperative nausea and vomiting)   . Headache(784.0)   . GERD (gastroesophageal reflux disease)     tums prn-with pregnancy only  . Placenta previa 2012    with current pregnancy-type and cross 2 Units per protocol  . Anemia   . Depression   . Migraines   . IBS (irritable bowel syndrome)   . Chicken pox     History   Social History  . Marital Status: Married    Spouse Name: [name]    Number of Children: [name]  . Years of Education: [name]   Occupational History  . Not on file.   Social History Main Topics  . Smoking status: Never Smoker   . Smokeless tobacco: Never Used  . Alcohol Use: 1.2 oz/week    2 Glasses of wine per week     Comment: rarely  . Drug Use: No  . Sexually Active: Yes   Other Topics Concern  . Not on file   Social History Narrative   Patient lives at home with her husband [name]). Patient works with Administrator, sports, desk job. 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   Daughter born 2012   Son 2006 (special needs)   Married    Past Surgical History  Procedure Laterality Date  . Arthroscopic knee  1990  .  Left breast reduction  1996  . Appendectomy  2005  . Cesarean section  12/27/2010    Procedure: CESAREAN SECTION;  Surgeon: Roselle Locus II;  Location: [facility];  Service: Gynecology;  Laterality: [name];    Family History  Problem Relation Age of Onset  . High blood pressure Mother   . Arthritis Mother   . Hypertension Mother   . High blood pressure Brother   . Alcohol abuse Father   . Arthritis Father     No Known Allergies  Current Outpatient Prescriptions on File Prior to Visit  Medication Sig Dispense Refill  . Cyanocobalamin (VITAMIN B-12) 5000 MCG SUBL Place under the tongue daily.      . Ferrous Sulfate (IRON) 325 (65 FE) MG TABS Take by mouth daily.      . Multiple Vitamin (MULTIVITAMIN WITH MINERALS) TABS Take 1 tablet by mouth daily.       No current facility-administered medications on file prior to visit.    BP 110/70  Pulse 74  Temp(Src) 98.7 F (37.1 C) (Oral)  Resp 16  Ht 5\' 8"  (1.727 m)  Wt 205 lb 1.9 oz (93.042 kg)  BMI 31.2 kg/m2  SpO2 99%  LMP 08/05/2012  Objective:   Physical Exam  Constitutional: She appears well-developed and well-nourished. No distress.  Psychiatric: She has a normal mood and affect. Her behavior is normal. Judgment and thought content normal.          Assessment & Plan:

## 2012-08-19 NOTE — Assessment & Plan Note (Signed)
Improving. Continue current dose of lexapro. Add prn ambien for sparing use at bedtime.

## 2012-08-19 NOTE — Patient Instructions (Addendum)
Please follow up in 3 months.  

## 2012-08-19 NOTE — Assessment & Plan Note (Signed)
Per neuro likely related to a transverse myelitis. We talked today at length about possibility of pursuing work up for MS. We discuss that if she is diagnosed early, she may be able to seek helpful treatments which can improve her quality of life.

## 2012-11-26 ENCOUNTER — Ambulatory Visit (INDEPENDENT_AMBULATORY_CARE_PROVIDER_SITE_OTHER): Payer: 59 | Admitting: Family

## 2012-11-26 ENCOUNTER — Encounter: Payer: Self-pay | Admitting: Family

## 2012-11-26 VITALS — BP 116/68 | HR 77 | Temp 97.9°F | Resp 16 | Ht 68.0 in | Wt 213.1 lb

## 2012-11-26 DIAGNOSIS — R209 Unspecified disturbances of skin sensation: Secondary | ICD-10-CM

## 2012-11-26 DIAGNOSIS — F3289 Other specified depressive episodes: Secondary | ICD-10-CM

## 2012-11-26 DIAGNOSIS — F329 Major depressive disorder, single episode, unspecified: Secondary | ICD-10-CM

## 2012-11-26 DIAGNOSIS — F32A Depression, unspecified: Secondary | ICD-10-CM

## 2012-11-26 MED ORDER — ESCITALOPRAM OXALATE 20 MG PO TABS: 20.0000 mg | ORAL_TABLET | Freq: Every day | ORAL | Status: DC

## 2012-11-26 MED ORDER — ZOLPIDEM TARTRATE 5 MG PO TABS: 5.0000 mg | ORAL_TABLET | Freq: Every evening | ORAL | Status: DC | PRN

## 2012-11-26 MED ORDER — FLUOCINONIDE 0.05 % EX GEL: Freq: Two times a day (BID) | CUTANEOUS | Status: DC

## 2012-11-26 NOTE — Progress Notes (Signed)
Subjective:    Patient ID: [name], female    DOB: [birth year], 37 y.o.   MRN: [id number]  HPI  [name] is a 37 yr old female who presents today for follow up of her depression/anxiety.  Having a lot of trouble with her son. Reports tearful, snappy.  Marital stress. "Not feeling as good as I was."  Reports difficulty writing with her right hand/arm. Following with Dr. Craige Cotta.   Some days she feels good and some days she feels awful.   ? Transverse myelitis of the spine. She has follow up MRI next Tuesday to re-evaluate it.   Review of Systems    see HPI  Past Medical History  Diagnosis Date  . PONV (postoperative nausea and vomiting)   . Headache(784.0)   . GERD (gastroesophageal reflux disease)     tums prn-with pregnancy only  . Placenta previa 2012    with current pregnancy-type and cross 2 Units per protocol  . Anemia   . Depression   . Migraines   . IBS (irritable bowel syndrome)   . Chicken pox     History   Social History  . Marital Status: Married    Spouse Name: N/A    Number of Children: N/A  . Years of Education: N/A   Occupational History  . Not on file.   Social History Main Topics  . Smoking status: Never Smoker   . Smokeless tobacco: Never Used  . Alcohol Use: 1.2 oz/week    2 Glasses of wine per week     Comment: rarely  . Drug Use: No  . Sexual Activity: Yes   Other Topics Concern  . Not on file   Social History Narrative   Patient lives at home with her husband Fayrene Fearing). Patient works with Administrator, sports, desk job. 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   Daughter born 2012   Son 2006 (special needs)   Married    Past Surgical History  Procedure Laterality Date  . Arthroscopic knee  1990  . Left breast reduction  1996  . Appendectomy  2005  . Cesarean section  12/27/2010    Procedure: CESAREAN SECTION;  Surgeon: Roselle Locus II;  Location: [facility];  Service: Gynecology;  Laterality: N/A;    Family History  Problem Relation Age of  Onset  . High blood pressure Mother   . Arthritis Mother   . Hypertension Mother   . High blood pressure Brother   . Alcohol abuse Father   . Arthritis Father     No Known Allergies  Current Outpatient Prescriptions on File Prior to Visit  Medication Sig Dispense Refill  . Cyanocobalamin (VITAMIN B-12) 5000 MCG SUBL Place under the tongue daily.      . Ferrous Sulfate (IRON) 325 (65 FE) MG TABS Take by mouth daily.      . Multiple Vitamin (MULTIVITAMIN WITH MINERALS) TABS Take 1 tablet by mouth daily.      Marland Kitchen zolpidem (AMBIEN) 5 MG tablet Take 1 tablet (5 mg total) by mouth at bedtime as needed for sleep.  15 tablet  0   No current facility-administered medications on file prior to visit.    BP 116/68  Pulse 77  Temp(Src) 97.9 F (36.6 C) (Oral)  Resp 16  Ht 5\' 8"  (1.727 m)  Wt 213 lb 1.9 oz (96.671 kg)  BMI 32.41 kg/m2  SpO2 98%  LMP 11/06/2012    Objective:   Physical Exam  Constitutional: She is oriented to  person, place, and time. She appears well-developed and well-nourished.  HENT:  Head: Normocephalic and atraumatic.  Cardiovascular: Normal rate and regular rhythm.   No murmur heard. Pulmonary/Chest: Effort normal and breath sounds normal. No respiratory distress. She has no wheezes. She has no rales. She exhibits no tenderness.  Musculoskeletal: She exhibits no edema.  Neurological: She is alert and oriented to person, place, and time.  Psychiatric: She has a normal mood and affect. Her behavior is normal. Judgment and thought content normal.          Assessment & Plan:

## 2012-11-26 NOTE — Patient Instructions (Addendum)
Use selsun blue shampoo until symptoms improve then use at least twice a week. Try lidex gel once daily for 1-2 weeks to affected area of scalp. Follow up in 6 weeks.

## 2012-11-26 NOTE — Assessment & Plan Note (Signed)
Deteriorated.  Will increase lexapro from 10mg  to 20mg .  Follow up in 1 month.

## 2012-11-26 NOTE — Assessment & Plan Note (Signed)
Pt to follow up with Neurology for follow up MRI.

## 2013-04-21 ENCOUNTER — Other Ambulatory Visit: Payer: Self-pay | Admitting: Family

## 2013-04-21 NOTE — Telephone Encounter (Signed)
Appointment scheduled for 05/06/13

## 2013-04-21 NOTE — Telephone Encounter (Signed)
Left detailed message informing patient of medication refill and that she needs to schedule an appointment

## 2013-04-21 NOTE — Telephone Encounter (Signed)
30 day supply lexapro sent to pharmacy.  Pt is past due for follow up.  Please call pt to arrange appt before this supply runs out.

## 2013-05-06 ENCOUNTER — Encounter: Payer: Self-pay | Admitting: Physical Medicine & Rehabilitation

## 2013-05-06 ENCOUNTER — Ambulatory Visit: Payer: 59 | Admitting: Family

## 2013-05-09 ENCOUNTER — Encounter: Payer: Self-pay | Admitting: Family

## 2013-05-09 ENCOUNTER — Ambulatory Visit (INDEPENDENT_AMBULATORY_CARE_PROVIDER_SITE_OTHER): Payer: 59 | Admitting: Family

## 2013-05-09 VITALS — BP 116/76 | HR 77 | Temp 97.9°F | Resp 16 | Ht 68.0 in | Wt 223.1 lb

## 2013-05-09 DIAGNOSIS — F32A Depression, unspecified: Secondary | ICD-10-CM

## 2013-05-09 DIAGNOSIS — G950 Syringomyelia and syringobulbia: Secondary | ICD-10-CM | POA: Insufficient documentation

## 2013-05-09 DIAGNOSIS — F329 Major depressive disorder, single episode, unspecified: Secondary | ICD-10-CM

## 2013-05-09 DIAGNOSIS — F3289 Other specified depressive episodes: Secondary | ICD-10-CM

## 2013-05-09 DIAGNOSIS — IMO0002 Reserved for concepts with insufficient information to code with codable children: Secondary | ICD-10-CM | POA: Insufficient documentation

## 2013-05-09 DIAGNOSIS — R229 Localized swelling, mass and lump, unspecified: Secondary | ICD-10-CM

## 2013-05-09 MED ORDER — ESCITALOPRAM OXALATE 20 MG PO TABS: ORAL_TABLET | ORAL | Status: DC

## 2013-05-09 NOTE — Assessment & Plan Note (Signed)
+   mass behind left ear. Will refer to ENT for further evaluation.

## 2013-05-09 NOTE — Assessment & Plan Note (Signed)
Management per neuro/neurosurgery.

## 2013-05-09 NOTE — Assessment & Plan Note (Signed)
Improved on current dose of lexapro. She has had some weight gain. We discussed diet and exercise. I have asked her to monitor her weight at home and contact us if she has further weight gain. She would like a referral to a therapist which has been placed.

## 2013-05-09 NOTE — Patient Instructions (Signed)
Keep close eye on weight. Try to keep calories to 1200 a day and exercise as able. You will be contacted about ENT and therapy referral. Call if you have further weight gain. Follow up in 6 months.

## 2013-05-09 NOTE — Progress Notes (Signed)
Pre visit review using our clinic review tool, if applicable. No additional management support is needed unless otherwise documented below in the visit note. 

## 2013-05-09 NOTE — Progress Notes (Signed)
Subjective:    Patient ID: [name], female    DOB: 1975-08-25, 38 y.o.   MRN: [id number]  HPI  [name] is a 38 yr old female who presents today for follow up of depression. Last visit depression had worsened and it was recommended that she increase lexapro from 10mg  to 20 mg.  She reports that . She denies unusual tearfulness. Denies SI/HI.  She has had some weight gain which she attributes to poor activity in the setting of pain.   Sensory disturbance- last visit she was in the process of evaluation of her spine for ? Transverse myelitis. 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 Reports that she has had a significant amount of pain.  Saw neuro at [facility] last week.  She has been referred to a neurosurgeon at [facility].  She wishes to try to avoid surgery.  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  Non-tender.  Wt Readings from Last 3 Encounters:  05/09/13 223 lb 1.9 oz (101.207 kg)  11/26/12 213 lb 1.9 oz (96.671 kg)  08/19/12 205 lb 1.9 oz (93.042 kg)    Review of Systems    see HPI  Past Medical History  Diagnosis Date  . PONV (postoperative nausea and vomiting)   . Headache(784.0)   . GERD (gastroesophageal reflux disease)     tums prn-with pregnancy only  . Placenta previa 2012    with current pregnancy-type and cross 2 Units per protocol  . Anemia   . Depression   . Migraines   . IBS (irritable bowel syndrome)   . Chicken pox     History   Social History  . Marital Status: Married    Spouse Name: N/A    Number of Children: N/A  . Years of Education: N/A   Occupational History  . Not on file.   Social History Main Topics  . Smoking status: Never Smoker   . Smokeless tobacco: Never Used  . Alcohol Use: 1.2 oz/week    2 Glasses of wine per week     Comment: rarely  . Drug Use: No  . Sexual Activity: Yes   Other Topics Concern  . Not on file   Social History Narrative   Patient lives at home with her husband [name]). 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   Daughter born 2012   Son 2006 (special needs)   Married    Past Surgical History  Procedure Laterality Date  . Arthroscopic knee  1990  . Left breast reduction  1996  . Appendectomy  2005  . Cesarean section  12/27/2010    Procedure: CESAREAN SECTION;  Surgeon: Shon Millet II;  Location: [facility];  Service: Gynecology;  Laterality: N/A;    Family History  Problem Relation Age of Onset  . High blood pressure Mother   . Arthritis Mother   . Hypertension Mother   . High blood pressure Brother   . Alcohol abuse Father   . Arthritis Father     No Known Allergies  Current Outpatient Prescriptions on File Prior to Visit  Medication Sig Dispense Refill  . escitalopram (LEXAPRO) 20 MG tablet TAKE 1 TABLET (20 MG TOTAL) BY MOUTH DAILY.  30 tablet  0  . fluocinonide gel (LIDEX) 0.05 % Apply topically 2 (two) times daily. Apply small amount once daily for 1-2 weeks to affected area of scalp  60 g  0  . Multiple Vitamin (MULTIVITAMIN WITH MINERALS)  TABS Take 1 tablet by mouth daily.       No current facility-administered medications on file prior to visit.    BP 116/76  Pulse 77  Temp(Src) 97.9 F (36.6 C) (Oral)  Resp 16  Ht 5\' 8"  (1.727 m)  Wt 223 lb 1.9 oz (101.207 kg)  BMI 33.93 kg/m2  SpO2 99%  LMP 04/30/2013    Objective:   Physical Exam  Constitutional: She is oriented to person, place, and time. She appears well-developed and well-nourished. No distress.  HENT:  Head: Normocephalic and atraumatic.  + mass behind the left ear. Firm, mobile, non-tender  Cardiovascular: Normal rate and regular rhythm.   No murmur heard. Pulmonary/Chest: Effort normal and breath sounds normal. No respiratory distress. She has no wheezes. She has no rales. She exhibits no tenderness.  Neurological: She is alert and oriented to person, place, and time.  Psychiatric: She has a normal mood and affect. Her behavior is normal. Judgment  and thought content normal.          Assessment & Plan:

## 2013-05-13 ENCOUNTER — Encounter: Payer: Self-pay | Admitting: Family

## 2013-05-16 ENCOUNTER — Ambulatory Visit: Payer: 59 | Attending: Neurology | Admitting: Rehabilitation

## 2013-05-16 DIAGNOSIS — IMO0001 Reserved for inherently not codable concepts without codable children: Secondary | ICD-10-CM | POA: Insufficient documentation

## 2013-05-16 DIAGNOSIS — M545 Low back pain, unspecified: Secondary | ICD-10-CM | POA: Insufficient documentation

## 2013-05-16 DIAGNOSIS — M546 Pain in thoracic spine: Secondary | ICD-10-CM | POA: Insufficient documentation

## 2013-05-16 DIAGNOSIS — R262 Difficulty in walking, not elsewhere classified: Secondary | ICD-10-CM | POA: Insufficient documentation

## 2013-05-20 ENCOUNTER — Ambulatory Visit: Payer: 59 | Admitting: Rehabilitation

## 2013-05-22 ENCOUNTER — Other Ambulatory Visit (HOSPITAL_COMMUNITY): Payer: Self-pay | Admitting: Otolaryngology

## 2013-05-22 DIAGNOSIS — H6190 Disorder of external ear, unspecified, unspecified ear: Secondary | ICD-10-CM

## 2013-05-23 ENCOUNTER — Ambulatory Visit (HOSPITAL_COMMUNITY)
Admission: RE | Admit: 2013-05-23 | Discharge: 2013-05-23 | Disposition: A | Discharge status: Home / Self Care | Payer: 59 | Source: Ambulatory Visit | Attending: Otolaryngology | Admitting: Otolaryngology

## 2013-05-23 DIAGNOSIS — L989 Disorder of the skin and subcutaneous tissue, unspecified: Secondary | ICD-10-CM | POA: Insufficient documentation

## 2013-05-23 DIAGNOSIS — H6190 Disorder of external ear, unspecified, unspecified ear: Secondary | ICD-10-CM

## 2013-05-23 MED ORDER — IOHEXOL 300 MG/ML  SOLN
50.0000 mL | Freq: Once | INTRAMUSCULAR | Status: AC | PRN
  Administered 2013-05-23: 50 mL via INTRAVENOUS

## 2013-06-05 ENCOUNTER — Ambulatory Visit: Payer: 59 | Admitting: Rehabilitation

## 2013-06-10 ENCOUNTER — Ambulatory Visit: Payer: 59 | Attending: Neurology | Admitting: Rehabilitation

## 2013-06-10 DIAGNOSIS — M546 Pain in thoracic spine: Secondary | ICD-10-CM | POA: Insufficient documentation

## 2013-06-10 DIAGNOSIS — IMO0001 Reserved for inherently not codable concepts without codable children: Secondary | ICD-10-CM | POA: Insufficient documentation

## 2013-06-10 DIAGNOSIS — M545 Low back pain, unspecified: Secondary | ICD-10-CM | POA: Insufficient documentation

## 2013-06-10 DIAGNOSIS — R262 Difficulty in walking, not elsewhere classified: Secondary | ICD-10-CM | POA: Insufficient documentation

## 2013-06-13 ENCOUNTER — Ambulatory Visit: Payer: 59 | Admitting: Rehabilitation

## 2013-06-13 ENCOUNTER — Ambulatory Visit: Payer: 59 | Admitting: Psychology

## 2013-06-17 ENCOUNTER — Ambulatory Visit: Payer: 59 | Admitting: Rehabilitation

## 2013-06-17 ENCOUNTER — Ambulatory Visit: Payer: 59 | Admitting: Physical Medicine & Rehabilitation

## 2013-06-20 ENCOUNTER — Ambulatory Visit: Payer: 59 | Admitting: Rehabilitation

## 2013-06-24 ENCOUNTER — Ambulatory Visit: Payer: 59 | Admitting: Rehabilitation

## 2013-06-27 ENCOUNTER — Ambulatory Visit: Payer: 59 | Admitting: Rehabilitation

## 2013-07-01 ENCOUNTER — Ambulatory Visit: Payer: 59 | Admitting: Rehabilitation

## 2013-07-04 ENCOUNTER — Ambulatory Visit: Payer: 59 | Admitting: Rehabilitation

## 2013-08-20 ENCOUNTER — Ambulatory Visit (INDEPENDENT_AMBULATORY_CARE_PROVIDER_SITE_OTHER): Payer: 59 | Admitting: Family

## 2013-08-20 ENCOUNTER — Encounter (HOSPITAL_BASED_OUTPATIENT_CLINIC_OR_DEPARTMENT_OTHER): Payer: Self-pay

## 2013-08-20 ENCOUNTER — Encounter: Payer: Self-pay | Admitting: Family

## 2013-08-20 ENCOUNTER — Ambulatory Visit (HOSPITAL_BASED_OUTPATIENT_CLINIC_OR_DEPARTMENT_OTHER)
Admission: RE | Admit: 2013-08-20 | Discharge: 2013-08-20 | Disposition: A | Discharge status: Home / Self Care | Payer: 59 | Source: Ambulatory Visit | Attending: Family | Admitting: Family

## 2013-08-20 VITALS — BP 110/78 | HR 85 | Temp 97.8°F | Resp 18 | Ht 68.0 in | Wt 224.1 lb

## 2013-08-20 DIAGNOSIS — R109 Unspecified abdominal pain: Secondary | ICD-10-CM

## 2013-08-20 DIAGNOSIS — R3129 Other microscopic hematuria: Secondary | ICD-10-CM

## 2013-08-20 LAB — POCT URINALYSIS DIPSTICK
Glucose, UA: NEGATIVE
Ketones, UA: NEGATIVE
Leukocytes, UA: NEGATIVE
Nitrite, UA: NEGATIVE
PH UA: 5
Spec Grav, UA: >=1.03
UROBILINOGEN UA: 0.2

## 2013-08-20 LAB — POCT URINE HCG BY VISUAL COLOR COMPARISON TESTS: Preg Test, Ur: NEGATIVE

## 2013-08-20 MED ORDER — IOHEXOL 300 MG/ML  SOLN
100.0000 mL | Freq: Once | INTRAMUSCULAR | Status: AC | PRN
  Administered 2013-08-20: 100 mL via INTRAVENOUS

## 2013-08-20 NOTE — Progress Notes (Signed)
Pre visit review using our clinic review tool, if applicable. No additional management support is needed unless otherwise documented below in the visit note. 

## 2013-08-20 NOTE — Patient Instructions (Signed)
Please complete CT scan on the first floor.  Go to ER if pain worsens. Follow up in 1 week.

## 2013-08-20 NOTE — Progress Notes (Signed)
Subjective:    Patient ID: [name], female    DOB: 07/07/1975, 38 y.o.   MRN: [id number]  HPI  [name] is a 38 yr old female who presents today with chief complaint of abdominal pain.  Reports that she has had increased abdominal distension x 2 weeks.  Had some "left ovary pain" and RUQ.  Reports + nausea.  LMP started on Saturday.  Denies associated fever.  Denies vaginal discharge, denies dyspareunia.  Denies diarrhea. Denies constipation, denies blood in stool.  Normal BM yesterday.   Wt Readings from Last 3 Encounters:  08/20/13 224 lb 1.3 oz (101.642 kg)  05/09/13 223 lb 1.9 oz (101.207 kg)  11/26/12 213 lb 1.9 oz (96.671 kg)     Review of Systems    see HPi  Past Medical History  Diagnosis Date  . PONV (postoperative nausea and vomiting)   . Headache(784.0)   . GERD (gastroesophageal reflux disease)     tums prn-with pregnancy only  . Placenta previa 2012    with current pregnancy-type and cross 2 Units per protocol  . Anemia   . Depression   . Migraines   . IBS (irritable bowel syndrome)   . Chicken pox     History   Social History  . Marital Status: Married    Spouse Name: N/A    Number of Children: N/A  . Years of Education: N/A   Occupational History  . Not on file.   Social History Main Topics  . Smoking status: Never Smoker   . Smokeless tobacco: Never Used  . Alcohol Use: 1.2 oz/week    2 Glasses of wine per week     Comment: rarely  . Drug Use: No  . Sexual Activity: Yes   Other Topics Concern  . Not on file   Social History Narrative   Patient lives at home with her husband [name]). 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   Daughter born 2012   Son 2006 (special needs)   Married    Past Surgical History  Procedure Laterality Date  . Arthroscopic knee  1990  . Left breast reduction  1996  . Appendectomy  2005  . Cesarean section  12/27/2010    Procedure: CESAREAN SECTION;  Surgeon: Shon Millet II;  Location: [facility];  Service: Gynecology;  Laterality: N/A;    Family History  Problem Relation Age of Onset  . High blood pressure Mother   . Arthritis Mother   . Hypertension Mother   . High blood pressure Brother   . Alcohol abuse Father   . Arthritis Father     Allergies  Allergen Reactions  . Topamax [Topiramate] Other (See Comments)    "Problems with word retrieval"    Current Outpatient Prescriptions on File Prior to Visit  Medication Sig Dispense Refill  . escitalopram (LEXAPRO) 20 MG tablet TAKE 1 TABLET (20 MG TOTAL) BY MOUTH DAILY.  30 tablet  5  . fluocinonide gel (LIDEX) 0.05 % Apply topically 2 (two) times daily. Apply small amount once daily for 1-2 weeks to affected area of scalp  60 g  0  . meloxicam (MOBIC) 15 MG tablet Take 15 mg by mouth daily.      . Multiple Vitamin (MULTIVITAMIN WITH MINERALS) TABS Take 1 tablet by mouth daily.      . nortriptyline (PAMELOR) 25 MG capsule Take 25 mg by mouth at bedtime.  No current facility-administered medications on file prior to visit.    BP 110/78  Pulse 85  Temp(Src) 97.8 F (36.6 C) (Oral)  Resp 18  Ht 5\' 8"  (1.727 m)  Wt 224 lb 1.3 oz (101.642 kg)  BMI 34.08 kg/m2  SpO2 98%  LMP 08/16/2013    Objective:   Physical Exam  Constitutional: She is oriented to person, place, and time. She appears well-developed and well-nourished. No distress.  HENT:  Head: Normocephalic and atraumatic.  Cardiovascular: Normal rate and regular rhythm.   No murmur heard. Pulmonary/Chest: Effort normal and breath sounds normal. No respiratory distress. She has no wheezes.  Abdominal: Soft. Bowel sounds are normal. She exhibits no distension.  Mild RLQ, suprapubic and LLQ tenderness to palpation  Musculoskeletal: She exhibits no edema.  Neurological: She is alert and oriented to person, place, and time.  Psychiatric: She has a normal mood and affect. Her behavior is normal. Judgment and thought content  normal.          Assessment & Plan:

## 2013-08-21 ENCOUNTER — Encounter: Payer: Self-pay | Admitting: Family

## 2013-08-21 DIAGNOSIS — R109 Unspecified abdominal pain: Secondary | ICD-10-CM | POA: Insufficient documentation

## 2013-08-21 LAB — URINE CULTURE
COLONY COUNT: NO GROWTH
ORGANISM ID, BACTERIA: NO GROWTH

## 2013-08-21 NOTE — Assessment & Plan Note (Signed)
CT abd/pelvis is performed today and is unremarkable.  Urine culture, hcg negative.  ? Resolving gastroenteritis.  Follow up in 1 week, sooner if problems concerns.

## 2013-08-27 ENCOUNTER — Ambulatory Visit: Payer: 59 | Admitting: Family

## 2013-08-27 DIAGNOSIS — Z0289 Encounter for other administrative examinations: Secondary | ICD-10-CM

## 2013-09-01 ENCOUNTER — Ambulatory Visit (INDEPENDENT_AMBULATORY_CARE_PROVIDER_SITE_OTHER): Payer: 59 | Admitting: Family

## 2013-09-01 ENCOUNTER — Encounter: Payer: Self-pay | Admitting: Family

## 2013-09-01 VITALS — BP 130/88 | HR 77 | Temp 98.3°F | Resp 16 | Ht 68.0 in | Wt 228.1 lb

## 2013-09-01 DIAGNOSIS — F329 Major depressive disorder, single episode, unspecified: Secondary | ICD-10-CM

## 2013-09-01 DIAGNOSIS — F32A Depression, unspecified: Secondary | ICD-10-CM

## 2013-09-01 DIAGNOSIS — R400 Somnolence: Secondary | ICD-10-CM

## 2013-09-01 DIAGNOSIS — F3289 Other specified depressive episodes: Secondary | ICD-10-CM

## 2013-09-01 DIAGNOSIS — G4710 Hypersomnia, unspecified: Secondary | ICD-10-CM

## 2013-09-01 DIAGNOSIS — R635 Abnormal weight gain: Secondary | ICD-10-CM

## 2013-09-01 DIAGNOSIS — R109 Unspecified abdominal pain: Secondary | ICD-10-CM

## 2013-09-01 LAB — TSH: TSH: 1.078 u[IU]/mL (ref 0.350–4.500)

## 2013-09-01 MED ORDER — ESCITALOPRAM OXALATE 10 MG PO TABS: ORAL_TABLET | ORAL | Status: DC

## 2013-09-01 NOTE — Progress Notes (Signed)
Subjective:    Patient ID: [name], female    DOB: 29-Jul-1975, 38 y.o.   MRN: [id number]  HPI  [name] is a 38 yr old female who presents today for follow up of of abdominal pain.  Last visit a CT abd/pelvis was performed which was unremarkable.  Reports that abdominal pain has improved.  She notes she still has some mild lower pelvic pain at times, has had C-section in the past.    Depression- concerned re: weight gain since starting lexapro. Feels like depression is well controlled.  Wants to try to wean of of med.  Wt Readings from Last 3 Encounters:  09/01/13 228 lb 1.3 oz (103.456 kg)  08/20/13 224 lb 1.3 oz (101.642 kg)  05/09/13 223 lb 1.9 oz (101.207 kg)   Fatigue- notes that she has + daytime somnolence.  + snoring, concerned re: possibility of OSA.  Review of Systems    see HPI  Past Medical History  Diagnosis Date  . PONV (postoperative nausea and vomiting)   . Headache(784.0)   . GERD (gastroesophageal reflux disease)     tums prn-with pregnancy only  . Placenta previa 2012    with current pregnancy-type and cross 2 Units per protocol  . Anemia   . Depression   . Migraines   . IBS (irritable bowel syndrome)   . Chicken pox     History   Social History  . Marital Status: Married    Spouse Name: N/A    Number of Children: N/A  . Years of Education: N/A   Occupational History  . Not on file.   Social History Main Topics  . Smoking status: Never Smoker   . Smokeless tobacco: Never Used  . Alcohol Use: 1.2 oz/week    2 Glasses of wine per week     Comment: rarely  . Drug Use: No  . Sexual Activity: Yes   Other Topics Concern  . Not on file   Social History Narrative   Patient lives at home with her husband [name]). 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   Daughter born 2012   Son 2006 (special needs)   Married    Past Surgical History  Procedure Laterality Date  . Arthroscopic knee  1990  . Left  breast reduction  1996  . Appendectomy  2005  . Cesarean section  12/27/2010    Procedure: CESAREAN SECTION;  Surgeon: Shon Millet II;  Location: [facility];  Service: Gynecology;  Laterality: N/A;    Family History  Problem Relation Age of Onset  . High blood pressure Mother   . Arthritis Mother   . Hypertension Mother   . High blood pressure Brother   . Alcohol abuse Father   . Arthritis Father     Allergies  Allergen Reactions  . Topamax [Topiramate] Other (See Comments)    "Problems with word retrieval"    Current Outpatient Prescriptions on File Prior to Visit  Medication Sig Dispense Refill  . escitalopram (LEXAPRO) 20 MG tablet TAKE 1 TABLET (20 MG TOTAL) BY MOUTH DAILY.  30 tablet  5  . fluocinonide gel (LIDEX) 0.05 % Apply topically 2 (two) times daily. Apply small amount once daily for 1-2 weeks to affected area of scalp  60 g  0  . meloxicam (MOBIC) 15 MG tablet Take 15 mg by mouth daily.      . Multiple Vitamin (MULTIVITAMIN WITH MINERALS) TABS Take 1 tablet by  mouth daily.      . nortriptyline (PAMELOR) 25 MG capsule Take 25 mg by mouth at bedtime.       No current facility-administered medications on file prior to visit.    BP 130/88  Pulse 77  Temp(Src) 98.3 F (36.8 C) (Oral)  Resp 16  Ht 5\' 8"  (1.727 m)  Wt 228 lb 1.3 oz (103.456 kg)  BMI 34.69 kg/m2  SpO2 99%  LMP 08/16/2013    Objective:   Physical Exam  Constitutional: She is oriented to person, place, and time. She appears well-developed and well-nourished. No distress.  Cardiovascular: Normal rate and regular rhythm.   No murmur heard. Abdominal: Soft.  + lower abdominal tenderness to palpation  Musculoskeletal: She exhibits no edema.  Neurological: She is alert and oriented to person, place, and time.  Psychiatric: She has a normal mood and affect. Her behavior is normal. Judgment and thought content normal.          Assessment & Plan:

## 2013-09-01 NOTE — Patient Instructions (Addendum)
Decrease lexapro to 10mg  for 2 weeks, then decrease to 5mg  tabs for 1 week, then stop. You will be contacted about your referral for home sleep study. Please let us know if you have not heard back within 1 week about your referral. Complete lab work prior to leaving.  Follow up in 2 months sooner if problems/concerns.

## 2013-09-01 NOTE — Progress Notes (Signed)
Pre visit review using our clinic review tool, if applicable. No additional management support is needed unless otherwise documented below in the visit note. 

## 2013-09-04 DIAGNOSIS — R400 Somnolence: Secondary | ICD-10-CM | POA: Insufficient documentation

## 2013-09-04 NOTE — Assessment & Plan Note (Signed)
I think that her pain is most likely related to scar tissue from her C section.  Monitor.

## 2013-09-04 NOTE — Assessment & Plan Note (Signed)
Stable, will attempt wean off lexapro- see instructions in AVS.

## 2013-09-04 NOTE — Assessment & Plan Note (Signed)
Will refer for home sleep study to evaluate for OSA.

## 2013-10-20 DIAGNOSIS — G4710 Hypersomnia, unspecified: Secondary | ICD-10-CM

## 2013-10-31 DIAGNOSIS — G4710 Hypersomnia, unspecified: Secondary | ICD-10-CM

## 2013-11-03 ENCOUNTER — Encounter: Payer: Self-pay | Admitting: Pulmonary Disease

## 2013-11-04 ENCOUNTER — Telehealth: Payer: Self-pay | Admitting: Family

## 2013-11-04 NOTE — Telephone Encounter (Signed)
See mychart msg

## 2013-11-11 ENCOUNTER — Encounter: Payer: Self-pay | Admitting: Medical

## 2013-11-11 ENCOUNTER — Ambulatory Visit (INDEPENDENT_AMBULATORY_CARE_PROVIDER_SITE_OTHER): Payer: 59 | Admitting: Medical

## 2013-11-11 VITALS — BP 135/85 | HR 97 | Temp 98.0°F | Wt 229.0 lb

## 2013-11-11 DIAGNOSIS — R05 Cough: Secondary | ICD-10-CM

## 2013-11-11 DIAGNOSIS — J309 Allergic rhinitis, unspecified: Secondary | ICD-10-CM | POA: Insufficient documentation

## 2013-11-11 DIAGNOSIS — J302 Other seasonal allergic rhinitis: Secondary | ICD-10-CM

## 2013-11-11 DIAGNOSIS — J3089 Other allergic rhinitis: Secondary | ICD-10-CM

## 2013-11-11 DIAGNOSIS — R059 Cough, unspecified: Secondary | ICD-10-CM

## 2013-11-11 DIAGNOSIS — J0100 Acute maxillary sinusitis, unspecified: Secondary | ICD-10-CM | POA: Insufficient documentation

## 2013-11-11 DIAGNOSIS — J029 Acute pharyngitis, unspecified: Secondary | ICD-10-CM

## 2013-11-11 LAB — POCT RAPID STREP A (OFFICE): RAPID STREP A SCREEN: NEGATIVE

## 2013-11-11 MED ORDER — HYDROCODONE-HOMATROPINE 5-1.5 MG/5ML PO SYRP: 5.0000 mL | ORAL_SOLUTION | Freq: Three times a day (TID) | ORAL | Status: DC | PRN

## 2013-11-11 MED ORDER — CEFDINIR 300 MG PO CAPS: 300.0000 mg | ORAL_CAPSULE | Freq: Two times a day (BID) | ORAL | Status: DC

## 2013-11-11 MED ORDER — FLUTICASONE PROPIONATE 50 MCG/ACT NA SUSP: 2.0000 | Freq: Every day | NASAL | Status: DC

## 2013-11-11 NOTE — Progress Notes (Signed)
Pre visit review using our clinic review tool, if applicable. No additional management support is needed unless otherwise documented below in the visit note. 

## 2013-11-11 NOTE — Assessment & Plan Note (Signed)
Rx of hycodan. If coughing at night despite hycodan or wheezing notify me.

## 2013-11-11 NOTE — Assessment & Plan Note (Signed)
Some sinus pressure this am 11 days after onset of illness. Did rx cefdinir.

## 2013-11-11 NOTE — Assessment & Plan Note (Signed)
Rx of fluticasone today. Advise can continue zyrtec. Stop mucinex and benzonatate.

## 2013-11-11 NOTE — Progress Notes (Signed)
   Subjective:    Patient ID: [name], female    DOB: Mar 20, 1975, 38 y.o.   MRN: [id number]  HPI  Pt in stating since 9-11 started with persistent st. The Friday that week felt wipe out. Then last week had severe sinus pressure with upper teeth sensitive. Has dry hacky cough with some sneezing. No wheezing. Cough a lot at night. No hx of asthma.When blows her nose some blood tinged mucous. LMP- 2 weeks ago. Tried some mucinex, tessalon perles, and zyrtec.   Recent mild sneezing.   Review of Systems  Constitutional: Negative for chills, fatigue and unexpected weight change.  HENT: Positive for congestion, sinus pressure and sore throat.        Very early st moderate to severe. Mild presently.   Respiratory: Positive for cough. Negative for chest tightness and wheezing.        Some blood tinged mucous when she coughs. Not every time but occasional.  Cardiovascular: Negative for chest pain and palpitations.  Gastrointestinal: Negative.   Musculoskeletal: Negative.   Neurological: Negative.   Hematological: Positive for adenopathy. Does not bruise/bleed easily.       Mild submandibular nodes mild swollen.  Psychiatric/Behavioral: Negative.        Objective:   Physical Exam  General  Mental Status - Alert. General Appearance - Well groomed. Not in acute distress.  Skin Rashes- No Rashes.  HEENT Head- Normal. Ear Auditory Canal - Left- Normal. Right - Normal.Tympanic Membrane- Left- Normal. Right- Normal. Eye Sclera/Conjunctiva- Left- Normal. Right- Normal. Nose & Sinuses Nasal Mucosa- Left- Boggy + Congested. Right-  Boggy + Congested. No maxillary or sinus pressure presently. Mouth & Throat Lips: Upper Lip- Normal: no dryness, cracking, pallor, cyanosis, or vesicular eruption. Lower Lip-Normal: no dryness, cracking, pallor, cyanosis or vesicular eruption. Buccal Mucosa- Bilateral- No Aphthous ulcers. Oropharynx- No Discharge or Erythema. Tonsils: Characteristics-  Bilateral- mild Erythema .  Congestion. No discharge. Size/Enlargement- Bilateral- No enlargement. Discharge- bilateral-None. Moderate bright red pharynx. No discharge.  Neck Neck- Supple. No Masses. Mild submandibular node  Tenderness and enlarged mild size.   Chest and Lung Exam Auscultation: Breath Sounds:-Normal, clear, even and unlabored.  Cardiovascular Auscultation:Rythm- Regular, rate and rhythm. Murmurs & Other Heart Sounds:Ausculatation of the heart reveal- No Murmurs.  Lymphatic Head & Neck General Head & Neck Lymphatics: Bilateral: Description- mild submandibular nodes enlarged.  Neurologic CN III-XII grossly intact.          Assessment & Plan:

## 2013-11-11 NOTE — Patient Instructions (Signed)
You appear to have allergic rhinitis with possible sinusitis now as symptoms have persisted. I will rx fluticasone spray for allergies. For your cough, I am prescribing hydrocodone based cough med. For your sinusitis, I am prescribing cefdinir. This would help with pharyngitis in event your rapid strep test was negative. Follow up in 7 days or as needed.

## 2013-11-19 ENCOUNTER — Ambulatory Visit (INDEPENDENT_AMBULATORY_CARE_PROVIDER_SITE_OTHER): Payer: 59 | Admitting: Medical

## 2013-11-19 ENCOUNTER — Ambulatory Visit (HOSPITAL_BASED_OUTPATIENT_CLINIC_OR_DEPARTMENT_OTHER)
Admission: RE | Admit: 2013-11-19 | Discharge: 2013-11-19 | Disposition: A | Discharge status: Home / Self Care | Payer: 59 | Source: Ambulatory Visit | Attending: Medical | Admitting: Medical

## 2013-11-19 ENCOUNTER — Encounter: Payer: Self-pay | Admitting: Medical

## 2013-11-19 VITALS — BP 120/85 | HR 88 | Temp 98.6°F | Ht 65.8 in | Wt 227.6 lb

## 2013-11-19 DIAGNOSIS — R05 Cough: Secondary | ICD-10-CM

## 2013-11-19 DIAGNOSIS — R071 Chest pain on breathing: Secondary | ICD-10-CM | POA: Insufficient documentation

## 2013-11-19 DIAGNOSIS — R062 Wheezing: Secondary | ICD-10-CM | POA: Insufficient documentation

## 2013-11-19 DIAGNOSIS — R059 Cough, unspecified: Secondary | ICD-10-CM | POA: Diagnosis not present

## 2013-11-19 MED ORDER — ALBUTEROL SULFATE HFA 108 (90 BASE) MCG/ACT IN AERS: 2.0000 | INHALATION_SPRAY | Freq: Four times a day (QID) | RESPIRATORY_TRACT | Status: DC | PRN

## 2013-11-19 MED ORDER — BECLOMETHASONE DIPROPIONATE 40 MCG/ACT IN AERS: 2.0000 | INHALATION_SPRAY | Freq: Two times a day (BID) | RESPIRATORY_TRACT | Status: DC

## 2013-11-19 MED ORDER — PREDNISONE 20 MG PO TABS: ORAL_TABLET | ORAL | Status: DC

## 2013-11-19 NOTE — Assessment & Plan Note (Signed)
Minimal wheeze recently with very dry cough. Cases posttreatment for sinusitis and allergies. Most of her symptoms have resolved except for the dry cough and the transient wheeze with left lower rib pain. I gave her 3 day course of prednisone. Also a prescription of Qvar to use 2 dilatations by mouth twice a day and prescription of albuterol to use 2 puffs by mouth q. 4-6 hours shortness of breath or wheezing. We'll go ahead and get a chest x-ray make sure there is no consolidation in the left lower lobe.

## 2013-11-19 NOTE — Patient Instructions (Signed)
Your cough may be from Reactive airways after recent illness. I will prescribe you 3 days of oral prednisone, qvar inhaler and albuterol inhaler. I want you to get cxr today to r/o infections process in left lower lobe.(region of left lower rib pain.) We will notify you of cxr result. Please give [place] update this monday on whether or not your cough is subsiding.

## 2013-11-19 NOTE — Progress Notes (Signed)
Subjective:    Patient ID: [name], female    DOB: 06/22/1975, 38 y.o.   MRN: [id number]  HPI   Pt in stating seen on 11-11-2013. Her symptoms started around 10-31-2013. She states sinus better, no fever, and chest congestion cleared up.(After I treated with cefdinir, fluticasone and hycodan). But still has dry cough and worse after 3 pm afternoon typically  until mid night. Pt states cough medicine is not helping much. Coughing so much that her left rib region hurts. Pt is not diabetic. lmp- 3 wks ago.  Pt notes some wheeze when she coughs agressively.  Past Medical History  Diagnosis Date  . PONV (postoperative nausea and vomiting)   . Headache(784.0)   . GERD (gastroesophageal reflux disease)     tums prn-with pregnancy only  . Placenta previa 2012    with current pregnancy-type and cross 2 Units per protocol  . Anemia   . Depression   . Migraines   . IBS (irritable bowel syndrome)   . Chicken pox     History   Social History  . Marital Status: Married    Spouse Name: N/A    Number of Children: N/A  . Years of Education: N/A   Occupational History  . Not on file.   Social History Main Topics  . Smoking status: Never Smoker   . Smokeless tobacco: Never Used  . Alcohol Use: 1.2 oz/week    2 Glasses of wine per week     Comment: rarely  . Drug Use: No  . Sexual Activity: Yes   Other Topics Concern  . Not on file   Social History Narrative   Patient lives at home with her husband [name]). 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   Daughter born 2012   Son 2006 (special needs)   Married    Past Surgical History  Procedure Laterality Date  . Arthroscopic knee  1990  . Left breast reduction  1996  . Appendectomy  2005  . Cesarean section  12/27/2010    Procedure: CESAREAN SECTION;  Surgeon: Shon Millet II;  Location: [facility];  Service: Gynecology;  Laterality: N/A;    Family History  Problem Relation Age of Onset    . High blood pressure Mother   . Arthritis Mother   . Hypertension Mother   . High blood pressure Brother   . Alcohol abuse Father   . Arthritis Father     Allergies  Allergen Reactions  . Topamax [Topiramate] Other (See Comments)    "Problems with word retrieval"    Current Outpatient Prescriptions on File Prior to Visit  Medication Sig Dispense Refill  . cefdinir (OMNICEF) 300 MG capsule Take 1 capsule (300 mg total) by mouth 2 (two) times daily.  20 capsule  0  . fluticasone (FLONASE) 50 MCG/ACT nasal spray Place 2 sprays into both nostrils daily.  16 g  1  . HYDROcodone-homatropine (HYCODAN) 5-1.5 MG/5ML syrup Take 5 mLs by mouth every 8 (eight) hours as needed for cough.  120 mL  0  . escitalopram (LEXAPRO) 10 MG tablet Take 1 tab by mouth once daily for 2 weeks, then decrease to 5mg  once daily for 1 week then stop  21 tablet  0  . fluocinonide gel (LIDEX) 0.05 % Apply topically 2 (two) times daily. Apply small amount once daily for 1-2 weeks to affected area of scalp  60 g  0  . meloxicam (MOBIC) 15 MG  tablet Take 15 mg by mouth daily.      . Multiple Vitamin (MULTIVITAMIN WITH MINERALS) TABS Take 1 tablet by mouth daily.      . nortriptyline (PAMELOR) 25 MG capsule Take 25 mg by mouth at bedtime.       No current facility-administered medications on file prior to visit.    BP 120/85  Pulse 88  Temp(Src) 98.6 F (37 C) (Oral)  Ht 5' 5.8" (1.671 m)  Wt 227 lb 9.6 oz (103.239 kg)  BMI 36.97 kg/m2  SpO2 99%  LMP 10/29/2013     Review of Systems  Constitutional: Negative for fever, chills and fatigue.  HENT: Negative.   Respiratory: Positive for cough and wheezing. Negative for choking and chest tightness.   Cardiovascular: Negative for chest pain and palpitations.  Gastrointestinal: Negative.   Musculoskeletal: Negative for arthralgias, back pain, neck pain and neck stiffness.       Left lower rib pain and maxillary region when she coughs.  Neurological:  Negative.   Hematological: Negative for adenopathy. Does not bruise/bleed easily.  Psychiatric/Behavioral: Negative.        Objective:   Physical Exam  Constitutional: She is oriented to person, place, and time. She appears well-developed and well-nourished. No distress.  HENT:  Head: Normocephalic and atraumatic.  Eyes: EOM are normal. Pupils are equal, round, and reactive to light.  Neck: Neck supple. No JVD present. No tracheal deviation present. No thyromegaly present.  Cardiovascular: Normal rate, regular rhythm and normal heart sounds.  Exam reveals no gallop and no friction rub.   No murmur heard. Pulmonary/Chest: Effort normal and breath sounds normal. No stridor. No respiratory distress. She has no wheezes. She has no rales. She exhibits no tenderness.  Abdominal: Soft. Bowel sounds are normal. She exhibits no distension and no mass. There is no tenderness. There is no rebound and no guarding.  Musculoskeletal:  On left lower rib tenderness to palpation and when she coughs.  Lymphadenopathy:    She has no cervical adenopathy.  Neurological: She is alert and oriented to person, place, and time. No cranial nerve deficit. Coordination normal.  Skin: She is not diaphoretic.  Psychiatric: She has a normal mood and affect. Her behavior is normal. Judgment and thought content normal.          Assessment & Plan:

## 2013-11-19 NOTE — Assessment & Plan Note (Signed)
Cough is the worst symptom present now. Most all her symptoms have resolved. Will get a chest x-ray to make sure no left lower lobe pneumonia.(Note this is being done due to a left lower rib region pain when she coughs.). Will follow chest x-ray. Not giving any further antibiotic today and less a chest x-ray is positive.  But will see how she responds to the prednisone and inhalers. If the chest x-ray is negative and she has minimal to poor response with today's medications then I might consider riding a azithromycin. But we'll wait to see results of chest x-ray and see how she does clinically.

## 2013-11-20 ENCOUNTER — Encounter: Payer: Self-pay | Admitting: Medical

## 2013-11-26 NOTE — Telephone Encounter (Signed)
Will advise pt can take alleve otc for pain. May be strained muscle between ribs. Could try 2 tab alleve po bid.

## 2013-12-22 ENCOUNTER — Encounter: Payer: Self-pay | Admitting: Medical

## 2014-01-01 ENCOUNTER — Encounter: Payer: Self-pay | Admitting: Family

## 2014-01-01 ENCOUNTER — Ambulatory Visit (INDEPENDENT_AMBULATORY_CARE_PROVIDER_SITE_OTHER): Payer: 59 | Admitting: Family

## 2014-01-01 ENCOUNTER — Ambulatory Visit (HOSPITAL_COMMUNITY)
Admission: RE | Admit: 2014-01-01 | Discharge: 2014-01-01 | Disposition: A | Discharge status: Home / Self Care | Payer: 59 | Source: Ambulatory Visit | Attending: Family | Admitting: Family

## 2014-01-01 VITALS — BP 111/70 | HR 83 | Temp 98.2°F | Resp 18 | Ht 68.0 in | Wt 227.6 lb

## 2014-01-01 DIAGNOSIS — R059 Cough, unspecified: Secondary | ICD-10-CM

## 2014-01-01 DIAGNOSIS — M79604 Pain in right leg: Secondary | ICD-10-CM | POA: Diagnosis not present

## 2014-01-01 DIAGNOSIS — M79661 Pain in right lower leg: Secondary | ICD-10-CM

## 2014-01-01 DIAGNOSIS — R079 Chest pain, unspecified: Secondary | ICD-10-CM | POA: Insufficient documentation

## 2014-01-01 DIAGNOSIS — R05 Cough: Secondary | ICD-10-CM

## 2014-01-01 DIAGNOSIS — L989 Disorder of the skin and subcutaneous tissue, unspecified: Secondary | ICD-10-CM | POA: Insufficient documentation

## 2014-01-01 DIAGNOSIS — M79609 Pain in unspecified limb: Secondary | ICD-10-CM

## 2014-01-01 LAB — D-DIMER, QUANTITATIVE (NOT AT ARMC): D DIMER QUANT: 0.38 ug{FEU}/mL (ref 0.00–0.48)

## 2014-01-01 MED ORDER — OMEPRAZOLE 40 MG PO CPDR: 40.0000 mg | DELAYED_RELEASE_CAPSULE | Freq: Every day | ORAL | Status: DC

## 2014-01-01 NOTE — Assessment & Plan Note (Signed)
Refer to Dermatology. Has small scabbed scalp lesion on posterior head that is pruritic as well as diffuse dandruff.

## 2014-01-01 NOTE — Progress Notes (Signed)
Pre visit review using our clinic review tool, if applicable. No additional management support is needed unless otherwise documented below in the visit note. 

## 2014-01-01 NOTE — Assessment & Plan Note (Signed)
Cough is unresolved. It is not worsening. Will try Prilosec 40 mg 1 PO daily. Referred to pulmonology since she has failed conservative therapy.

## 2014-01-01 NOTE — Progress Notes (Signed)
Subjective:    Patient ID: [name], female    DOB: 1976/01/05, 38 y.o.   MRN: [id number]  HPI  [name] is a 38 yo female here to follow up for a cough that is not resolving.  1) Cough- Dry cough that has been occuring since September. She has seen Mackie Pai, PA-C twice and been treated. It is worse in the late afternoon. Occasionally the cough is productive and clear sputum is produced. Patient has been concerned with left and right sided chest pain that is uncomfortable and dull achy. The following interventions were tried and did not resolve the cough:  Delsym, Prednisone, Albuterol and QVAR inhalers, cough drops, and mucinex were helpful short term.   Cefdinir, decongestants, and Tessalon were not helpful she reports.  CXR was normal 11/19/13, she felt some rib pain in the past and this has resolved.   2) Right calf pain that is intermittent when sitting. Mother has a hx of DVT and PEs. She denies tenderness, warmth, or swelling.  LMP- was reported "near halloween". Reports normal for patient.  3) Scalp lesions- She is still having unresolved scalp itching and lesions that are scabbed over and crusted. They are 1 cm or less and only one was visualized.   Past Medical History  Diagnosis Date  . PONV (postoperative nausea and vomiting)   . Headache(784.0)   . GERD (gastroesophageal reflux disease)     tums prn-with pregnancy only  . Placenta previa 2012    with current pregnancy-type and cross 2 Units per protocol  . Anemia   . Depression   . Migraines   . IBS (irritable bowel syndrome)   . Chicken pox     History   Social History  . Marital Status: Married    Spouse Name: N/A    Number of Children: N/A  . Years of Education: N/A   Occupational History  . Not on file.   Social History Main Topics  . Smoking status: Never Smoker   . Smokeless tobacco: Never Used  . Alcohol Use: 1.2 oz/week    2 Glasses of wine per week     Comment: rarely  .  Drug Use: No  . Sexual Activity: Yes   Other Topics Concern  . Not on file   Social History Narrative   Patient lives at home with her husband [name]). 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   Daughter born 2012   Son 2006 (special needs)   Married    Past Surgical History  Procedure Laterality Date  . Arthroscopic knee  1990  . Left breast reduction  1996  . Appendectomy  2005  . Cesarean section  12/27/2010    Procedure: CESAREAN SECTION;  Surgeon: Shon Millet II;  Location: [facility];  Service: Gynecology;  Laterality: N/A;    Family History  Problem Relation Age of Onset  . High blood pressure Mother   . Arthritis Mother   . Hypertension Mother   . High blood pressure Brother   . Alcohol abuse Father   . Arthritis Father     Allergies  Allergen Reactions  . Topamax [Topiramate] Other (See Comments)    "Problems with word retrieval"    Current Outpatient Prescriptions on File Prior to Visit  Medication Sig Dispense Refill  . albuterol (PROVENTIL HFA;VENTOLIN HFA) 108 (90 BASE) MCG/ACT inhaler Inhale 2 puffs into the lungs every 6 (six) hours as needed for wheezing or shortness  of breath. 1 Inhaler 0  . beclomethasone (QVAR) 40 MCG/ACT inhaler Inhale 2 puffs into the lungs 2 (two) times daily. (Patient taking differently: Inhale 2 puffs into the lungs 2 (two) times daily as needed. ) 1 Inhaler 0  . Multiple Vitamin (MULTIVITAMIN WITH MINERALS) TABS Take 1 tablet by mouth daily.     No current facility-administered medications on file prior to visit.    BP 111/70 mmHg  Pulse 83  Temp(Src) 98.2 F (36.8 C) (Oral)  Resp 18  Ht 5\' 8"  (1.727 m)  Wt 227 lb 9.6 oz (103.239 kg)  BMI 34.61 kg/m2  SpO2 99%  LMP 12/20/2013    Review of Systems  Constitutional: Negative for fever, chills, diaphoresis, appetite change, fatigue and unexpected weight change.  HENT: Negative for congestion, ear discharge, ear pain, postnasal drip,  rhinorrhea, sinus pressure, sneezing and sore throat.   Eyes: Negative for pain, discharge, redness and itching.  Respiratory: Positive for cough. Negative for chest tightness, shortness of breath and wheezing.        States she has intermittent chest pain that moves from right to left  Cardiovascular: Positive for chest pain.  Neurological: Positive for headaches. Negative for syncope.       Base of head HA x 2 days       Objective:   Physical Exam  Constitutional: She is oriented to person, place, and time. She appears well-developed and well-nourished. No distress.  HENT:  Head: Atraumatic.  Right Ear: External ear normal.  Left Ear: External ear normal.  Nose: Nose normal.  Mouth/Throat: Oropharynx is clear and moist. No oropharyngeal exudate.  1 cm healing, crusted, circular scalp lesion, diffuse dandruff.  Eyes: Conjunctivae are normal. Pupils are equal, round, and reactive to light. Right eye exhibits no discharge. Left eye exhibits no discharge. No scleral icterus.  Neck: Neck supple. No thyromegaly present.  Cardiovascular: Normal rate, regular rhythm, normal heart sounds and intact distal pulses.  Exam reveals no gallop and no friction rub.   No murmur heard. Pulmonary/Chest: Effort normal and breath sounds normal. No respiratory distress. She has no wheezes. She has no rales.  Abdominal: Soft. Bowel sounds are normal. She exhibits no distension and no mass. There is no tenderness. There is no rebound and no guarding.  Musculoskeletal:  RLE was not tender to palpation, warm, or erythematous. It is slightly smaller than the left calf empirically. Homans sign bilaterally was negative  Lymphadenopathy:    She has no cervical adenopathy.  Neurological: She is alert and oriented to person, place, and time.  Skin: Skin is warm and dry.  See HENT for scalp  Psychiatric: She has a normal mood and affect. Her behavior is normal. Judgment and thought content normal.          Assessment & Plan:

## 2014-01-01 NOTE — Progress Notes (Addendum)
*  PRELIMINARY RESULTS* Vascular Ultrasound Right lower extremity venous duplex has been completed.  Preliminary findings: No evidence of DVT or baker's cyst.   Attempted call report to office number, since no call report number was given. I waited on the line for 15 minutes with no answer. Let patient leave since test is negative.      Landry Mellow, RDMS, RVT  01/01/2014, 1:15 PM

## 2014-01-01 NOTE — Patient Instructions (Signed)
Please complete your doppler on the first floor. Complete lab work prior to leaving. Start prilosec 40mg  once daily and claritin 10mg  once daily. You will be contacted about your referral to see dermatology and pulmonology. Call if symptoms worsen or if not improved in 2 weeks.   Go to the ER if you develop severe/worsening chest pain.

## 2014-01-01 NOTE — Assessment & Plan Note (Addendum)
Did EKG for intermittent chest pain (last episode last night). EKG today is unchanged from 06/27/12. No acute changes noted on EKG.  Symptoms atypical. Pt to go to the ED if severe/recurrent CP.

## 2014-01-01 NOTE — Progress Notes (Signed)
Patient ID: [name], female   DOB: Aug 15, 1975, 38 y.o.   MRN: [id number] I have personally seen and examined Mr. [name] along with Lorane Gell NP for training/orientation purposes. I agree with Ms Flonnie Overman' assessment and plan.   Addendum:  D dimer neg, preliminary doppler negative for DVT.

## 2014-02-05 ENCOUNTER — Institutional Professional Consult (permissible substitution): Payer: 59 | Admitting: Pulmonary Disease

## 2014-02-12 ENCOUNTER — Institutional Professional Consult (permissible substitution): Payer: 59 | Admitting: Critical Care Medicine

## 2014-03-30 ENCOUNTER — Ambulatory Visit (INDEPENDENT_AMBULATORY_CARE_PROVIDER_SITE_OTHER): Payer: 59 | Admitting: Internal Medicine

## 2014-03-30 ENCOUNTER — Encounter: Payer: Self-pay | Admitting: Internal Medicine

## 2014-03-30 VITALS — BP 126/79 | HR 82 | Temp 98.1°F | Ht 68.0 in | Wt 220.0 lb

## 2014-03-30 DIAGNOSIS — H9201 Otalgia, right ear: Secondary | ICD-10-CM

## 2014-03-30 NOTE — Progress Notes (Signed)
Pre visit review using our clinic review tool, if applicable. No additional management support is needed unless otherwise documented below in the visit note. 

## 2014-03-30 NOTE — Progress Notes (Signed)
Subjective:    Patient ID: [name], female    DOB: Sep 29, 1975, 39 y.o.   MRN: [id number]  DOS:  03/30/2014 Type of visit - description : acute  Interval history: A day ago developed mild itchy and pain at the right ear, pain was severe at 3 AM this morning, she took ibuprofen on feel better this afternoon.   Review of Systems No fever chills or runny nose No nausea or vomiting No cough No dental pain or pain when she chews  Past Medical History  Diagnosis Date  . PONV (postoperative nausea and vomiting)   . Headache(784.0)   . GERD (gastroesophageal reflux disease)     tums prn-with pregnancy only  . Placenta previa 2012    with current pregnancy-type and cross 2 Units per protocol  . Anemia   . Depression   . Migraines   . IBS (irritable bowel syndrome)   . Chicken pox     Past Surgical History  Procedure Laterality Date  . Arthroscopic knee  1990  . Left breast reduction  1996  . Appendectomy  2005  . Cesarean section  12/27/2010    Procedure: CESAREAN SECTION;  Surgeon: Shon Millet II;  Location: [facility];  Service: Gynecology;  Laterality: N/A;    History   Social History  . Marital Status: Married    Spouse Name: N/A    Number of Children: N/A  . Years of Education: N/A   Occupational History  . Not on file.   Social History Main Topics  . Smoking status: Never Smoker   . Smokeless tobacco: Never Used  . Alcohol Use: 1.2 oz/week    2 Glasses of wine per week     Comment: rarely  . Drug Use: No  . Sexual Activity: Yes   Other Topics Concern  . Not on file   Social History Narrative   Patient lives at home with her husband [name]). 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   Daughter born 2012   Son 2006 (special needs)   Married        Medication List       This list is accurate as of: 03/30/14  7:15 PM.  Always use your most recent med list.               albuterol 108 (90 BASE) MCG/ACT inhaler    Commonly known as:  PROVENTIL HFA;VENTOLIN HFA  Inhale 2 puffs into the lungs every 6 (six) hours as needed for wheezing or shortness of breath.     beclomethasone 40 MCG/ACT inhaler  Commonly known as:  QVAR  Inhale 2 puffs into the lungs 2 (two) times daily.     multivitamin with minerals Tabs tablet  Take 1 tablet by mouth daily.     omeprazole 40 MG capsule  Commonly known as:  PRILOSEC  Take 1 capsule (40 mg total) by mouth daily.           Objective:   Physical Exam  Constitutional: She is oriented to person, place, and time. She appears well-developed. No distress.  HENT:  Head: Normocephalic and atraumatic.  Nose: Nose normal.  Throat symmetric, uvula midline. TMs are both slightly bulge but no red, no discharge. Canals are normal except for a 2 mm scoriation at the external third of the right ear canal. TMJs without click or pain  Cardiovascular:  RRR, no murmur, rub or gallop  Pulmonary/Chest: Effort normal. No  respiratory distress.  CTA B  Lymphadenopathy:    She has no cervical adenopathy.  Neurological: She is alert and oriented to person, place, and time. No cranial nerve deficit. She exhibits normal muscle tone. Coordination normal.  Speech normal, gait unassisted and normal for age, motor strength appropriate for age   Skin: Skin is warm and dry. No pallor.  No jaundice  Psychiatric: She has a normal mood and affect. Her behavior is normal. Judgment and thought content normal.  Vitals reviewed.        Assessment & Plan:  A cute otalgia, Unclear etiology, both TMs are slightly bulge, allergies? No evidence of infection or TMJ pathology. Doubt the small excoriation at the right canal explains the pain. Plan: Try Flonase, see instructions    Problem List Items Addressed This Visit    None

## 2014-03-30 NOTE — Patient Instructions (Signed)
Use OTC Flonase 2 sprays on each side of the nose for 2 weeks then as needed. If the pain  is not gradually improving in the next 10 days, please let me know

## 2014-04-24 ENCOUNTER — Encounter: Payer: Self-pay | Admitting: Family

## 2014-04-24 ENCOUNTER — Ambulatory Visit (INDEPENDENT_AMBULATORY_CARE_PROVIDER_SITE_OTHER): Payer: 59 | Admitting: Family

## 2014-04-24 VITALS — BP 103/73 | HR 84 | Temp 97.6°F | Resp 18 | Ht 68.0 in

## 2014-04-24 DIAGNOSIS — M545 Low back pain, unspecified: Secondary | ICD-10-CM | POA: Insufficient documentation

## 2014-04-24 MED ORDER — OXYCODONE-ACETAMINOPHEN 5-325 MG PO TABS: 1.0000 | ORAL_TABLET | Freq: Three times a day (TID) | ORAL | Status: DC | PRN

## 2014-04-24 MED ORDER — CYCLOBENZAPRINE HCL 5 MG PO TABS: 5.0000 mg | ORAL_TABLET | Freq: Three times a day (TID) | ORAL | Status: DC | PRN

## 2014-04-24 MED ORDER — METHYLPREDNISOLONE 4 MG PO KIT: PACK | ORAL | Status: DC

## 2014-04-24 NOTE — Patient Instructions (Signed)
Start medrol dose pak, you may use percocet and flexeril as needed short term. Call if symptoms worsen or if symptoms are not improved in 1 week.   Back Pain, Adult Low back pain is very common. About 1 in 5 people have back pain.The cause of low back pain is rarely dangerous. The pain often gets better over time.About half of people with a sudden onset of back pain feel better in just 2 weeks. About 8 in 10 people feel better by 6 weeks.  CAUSES Some common causes of back pain include:  Strain of the muscles or ligaments supporting the spine.  Wear and tear (degeneration) of the spinal discs.  Arthritis.  Direct injury to the back. DIAGNOSIS Most of the time, the direct cause of low back pain is not known.However, back pain can be treated effectively even when the exact cause of the pain is unknown.Answering your caregiver's questions about your overall health and symptoms is one of the most accurate ways to make sure the cause of your pain is not dangerous. If your caregiver needs more information, he or she may order lab work or imaging tests (X-rays or MRIs).However, even if imaging tests show changes in your back, this usually does not require surgery. HOME CARE INSTRUCTIONS For many people, back pain returns.Since low back pain is rarely dangerous, it is often a condition that people can learn to [facility] their own.   Remain active. It is stressful on the back to sit or stand in one place. Do not sit, drive, or stand in one place for more than 30 minutes at a time. Take short walks on level surfaces as soon as pain allows.Try to increase the length of time you walk each day.  Do not stay in bed.Resting more than 1 or 2 days can delay your recovery.  Do not avoid exercise or work.Your body is made to move.It is not dangerous to be active, even though your back may hurt.Your back will likely heal faster if you return to being active before your pain is gone.  Pay attention to  your body when you bend and lift. Many people have less discomfortwhen lifting if they bend their knees, keep the load close to their bodies,and avoid twisting. Often, the most comfortable positions are those that put less stress on your recovering back.  Find a comfortable position to sleep. Use a firm mattress and lie on your side with your knees slightly bent. If you lie on your back, put a pillow under your knees.  Only take over-the-counter or prescription medicines as directed by your caregiver. Over-the-counter medicines to reduce pain and inflammation are often the most helpful.Your caregiver may prescribe muscle relaxant drugs.These medicines help dull your pain so you can more quickly return to your normal activities and healthy exercise.  Put ice on the injured area.  Put ice in a plastic bag.  Place a towel between your skin and the bag.  Leave the ice on for 15-20 minutes, 03-04 times a day for the first 2 to 3 days. After that, ice and heat may be alternated to reduce pain and spasms.  Ask your caregiver about trying back exercises and gentle massage. This may be of some benefit.  Avoid feeling anxious or stressed.Stress increases muscle tension and can worsen back pain.It is important to recognize when you are anxious or stressed and learn ways to manage it.Exercise is a great option. SEEK MEDICAL CARE IF:  You have pain that is not  relieved with rest or medicine.  You have pain that does not improve in 1 week.  You have new symptoms.  You are generally not feeling well. SEEK IMMEDIATE MEDICAL CARE IF:   You have pain that radiates from your back into your legs.  You develop new bowel or bladder control problems.  You have unusual weakness or numbness in your arms or legs.  You develop nausea or vomiting.  You develop abdominal pain.  You feel faint. Document Released: 02/06/2005 Document Revised: 08/08/2011 Document Reviewed: 06/10/2013 [facility]  Patient Information 2015 [place], [place]. This information is not intended to replace advice given to you by your health care provider. Make sure you discuss any questions you have with your health care provider.

## 2014-04-24 NOTE — Progress Notes (Addendum)
Subjective:    Patient ID: [name], female    DOB: [birth year], 39 y.o.   MRN: [id number]  HPI   [name] is a 39 yr old female who presents today with chief complaint of low back pain. Started 3/2 pm just in the lower back 10/10, described as aching/burning pain.  Today pain radiates into both buttocks, but does not radiate down her legs. Denies fecal or urinary incontinence. She is in the process of moving/packing.  Tried vicodin, ice, heat and aleve without improvement. Some improvement with laying on her side. Denies associated leg weakness or numbness.      Review of Systems See HPI  Past Medical History  Diagnosis Date  . PONV (postoperative nausea and vomiting)   . Headache(784.0)   . GERD (gastroesophageal reflux disease)     tums prn-with pregnancy only  . Placenta previa 2012    with current pregnancy-type and cross 2 Units per protocol  . Anemia   . Depression   . Migraines   . IBS (irritable bowel syndrome)   . Chicken pox     History   Social History  . Marital Status: Married    Spouse Name: N/A  . Number of Children: N/A  . Years of Education: N/A   Occupational History  . Not on file.   Social History Main Topics  . Smoking status: Never Smoker   . Smokeless tobacco: Never Used  . Alcohol Use: 1.2 oz/week    2 Glasses of wine per week     Comment: rarely  . Drug Use: No  . Sexual Activity: Yes   Other Topics Concern  . Not on file   Social History Narrative   Patient lives at home with her husband [name]). 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   Daughter born 2012   Son 2006 (special needs)   Married    Past Surgical History  Procedure Laterality Date  . Arthroscopic knee  1990  . Left breast reduction  1996  . Appendectomy  2005  . Cesarean section  12/27/2010    Procedure: CESAREAN SECTION;  Surgeon: Shon Millet II;  Location: [facility];  Service: Gynecology;  Laterality: N/A;    Family  History  Problem Relation Age of Onset  . High blood pressure Mother   . Arthritis Mother   . Hypertension Mother   . High blood pressure Brother   . Alcohol abuse Father   . Arthritis Father     Allergies  Allergen Reactions  . Topamax [Topiramate] Other (See Comments)    "Problems with word retrieval"    Current Outpatient Prescriptions on File Prior to Visit  Medication Sig Dispense Refill  . Multiple Vitamin (MULTIVITAMIN WITH MINERALS) TABS Take 1 tablet by mouth daily.     No current facility-administered medications on file prior to visit.    BP 103/73 mmHg  Pulse 84  Temp(Src) 97.6 F (36.4 C) (Oral)  Resp 18  Ht 5\' 8"  (1.727 m)  Wt   SpO2 99%  LMP 04/19/2014       Objective:   Physical Exam  Constitutional: She appears well-developed and well-nourished.  Uncomfortable appearing female.   Cardiovascular: Normal rate, regular rhythm and normal heart sounds.   No murmur heard. Pulmonary/Chest: Effort normal and breath sounds normal. No respiratory distress. She has no wheezes.  Musculoskeletal:       Thoracic back: She exhibits no tenderness.  Lumbar back: She exhibits tenderness.  Neurological:  Reflex Scores:      Patellar reflexes are 2+ on the right side and 2+ on the left side. Psychiatric: She has a normal mood and affect. Her behavior is normal. Judgment and thought content normal.          Assessment & Plan:

## 2014-04-24 NOTE — Assessment & Plan Note (Signed)
New, no improvement with current measures.  Pt instructed to call if symptoms worsen (bowel/bladder incontinence) if leg weakness/numbness, or if not improved in 1 week. Will rx with percocet, flexeril, medrol dose pak.

## 2014-04-24 NOTE — Progress Notes (Signed)
Pre visit review using our clinic review tool, if applicable. No additional management support is needed unless otherwise documented below in the visit note. 

## 2014-06-25 LAB — OB RESULTS CONSOLE RPR: RPR: NONREACTIVE

## 2014-06-25 LAB — OB RESULTS CONSOLE HIV ANTIBODY (ROUTINE TESTING): HIV: NONREACTIVE

## 2014-06-25 LAB — OB RESULTS CONSOLE ABO/RH: RH TYPE: POSITIVE

## 2014-06-25 LAB — OB RESULTS CONSOLE HEPATITIS B SURFACE ANTIGEN: HEP B S AG: NEGATIVE

## 2014-06-25 LAB — OB RESULTS CONSOLE RUBELLA ANTIBODY, IGM: Rubella: IMMUNE

## 2014-06-25 LAB — OB RESULTS CONSOLE ANTIBODY SCREEN: Antibody Screen: NEGATIVE

## 2014-07-14 ENCOUNTER — Other Ambulatory Visit: Payer: Self-pay | Admitting: Obstetrics and Gynecology

## 2014-07-14 LAB — OB RESULTS CONSOLE GC/CHLAMYDIA
Chlamydia: NEGATIVE
Gonorrhea: NEGATIVE

## 2014-07-15 LAB — CYTOLOGY - PAP

## 2014-11-29 ENCOUNTER — Encounter (HOSPITAL_COMMUNITY): Payer: Self-pay | Admitting: *Deleted

## 2014-11-29 ENCOUNTER — Inpatient Hospital Stay (HOSPITAL_COMMUNITY)
Admission: AD | Admit: 2014-11-29 | Discharge: 2014-11-29 | Disposition: A | Discharge status: Home / Self Care | Payer: No Typology Code available for payment source | Source: Ambulatory Visit | Attending: Obstetrics and Gynecology | Admitting: Obstetrics and Gynecology

## 2014-11-29 ENCOUNTER — Inpatient Hospital Stay (HOSPITAL_COMMUNITY): Payer: No Typology Code available for payment source

## 2014-11-29 DIAGNOSIS — R51 Headache: Secondary | ICD-10-CM | POA: Diagnosis present

## 2014-11-29 DIAGNOSIS — Z3A31 31 weeks gestation of pregnancy: Secondary | ICD-10-CM | POA: Diagnosis not present

## 2014-11-29 DIAGNOSIS — O9A213 Injury, poisoning and certain other consequences of external causes complicating pregnancy, third trimester: Secondary | ICD-10-CM

## 2014-11-29 DIAGNOSIS — K589 Irritable bowel syndrome without diarrhea: Secondary | ICD-10-CM | POA: Diagnosis not present

## 2014-11-29 DIAGNOSIS — Z041 Encounter for examination and observation following transport accident: Secondary | ICD-10-CM | POA: Insufficient documentation

## 2014-11-29 DIAGNOSIS — M545 Low back pain: Secondary | ICD-10-CM | POA: Diagnosis present

## 2014-11-29 DIAGNOSIS — S3992XA Unspecified injury of lower back, initial encounter: Secondary | ICD-10-CM | POA: Diagnosis not present

## 2014-11-29 DIAGNOSIS — O26893 Other specified pregnancy related conditions, third trimester: Secondary | ICD-10-CM | POA: Insufficient documentation

## 2014-11-29 DIAGNOSIS — K219 Gastro-esophageal reflux disease without esophagitis: Secondary | ICD-10-CM | POA: Diagnosis not present

## 2014-11-29 DIAGNOSIS — F329 Major depressive disorder, single episode, unspecified: Secondary | ICD-10-CM | POA: Insufficient documentation

## 2014-11-29 HISTORY — DX: Syringomyelia and syringobulbia: G95.0

## 2014-11-29 LAB — URINALYSIS, ROUTINE W REFLEX MICROSCOPIC
Bilirubin Urine: NEGATIVE
Glucose, UA: NEGATIVE mg/dL
HGB URINE DIPSTICK: NEGATIVE
Ketones, ur: NEGATIVE mg/dL
LEUKOCYTES UA: NEGATIVE
NITRITE: NEGATIVE
PROTEIN: NEGATIVE mg/dL
SPECIFIC GRAVITY, URINE: 1.01 (ref 1.005–1.030)
UROBILINOGEN UA: 0.2 mg/dL (ref 0.0–1.0)
pH: 6.5 (ref 5.0–8.0)

## 2014-11-29 LAB — CBC
HCT: 31.9 % — ABNORMAL LOW (ref 36.0–46.0)
Hemoglobin: 10.4 g/dL — ABNORMAL LOW (ref 12.0–15.0)
MCH: 27.7 pg (ref 26.0–34.0)
MCHC: 32.6 g/dL (ref 30.0–36.0)
MCV: 85.1 fL (ref 78.0–100.0)
Platelets: 320 10*3/uL (ref 150–400)
RBC: 3.75 MIL/uL — ABNORMAL LOW (ref 3.87–5.11)
RDW: 14.1 % (ref 11.5–15.5)
WBC: 13.6 10*3/uL — ABNORMAL HIGH (ref 4.0–10.5)

## 2014-11-29 LAB — KLEIHAUER-BETKE STAIN
# Vials RhIg: 1
Fetal Cells %: 0 %
Quantitation Fetal Hemoglobin: 0 mL

## 2014-11-29 MED ORDER — ACETAMINOPHEN 325 MG PO TABS
650.0000 mg | ORAL_TABLET | Freq: Once | ORAL | Status: AC
  Administered 2014-11-29: 650 mg via ORAL
  Filled 2014-11-29: qty 2
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
## 2014-11-29 NOTE — Discharge Instructions (Signed)
What Do I Need to Know About Injuries During Pregnancy? Injuries can happen during pregnancy. Minor falls and accidents usually do not harm you or your baby. However, any injury should be reported to your doctor. WHAT CAN I DO TO PROTECT MYSELF FROM INJURIES?  Remove rugs and loose objects on the floor.  Wear comfortable shoes that have a good grip. Do not wear high-heeled shoes.  Always wear your seat belt. The lap belt should be below your belly. Always practice safe driving.  Do not ride on a motorcycle.  Do not participate in high-impact activities or sports.  Avoid:  Walking on wet or slippery floors.  Fires.  Starting fires.  Lifting heavy pots of boiling or hot liquids.  Fixing electrical problems.  Only take medicine as told by your doctor.  Know your blood type and the blood type of the baby's father.  Call your local emergency services (911 in the [place] if you are a victim of domestic violence or assault. For help and support, contact the [employer]. WHEN SHOULD I GET HELP RIGHT AWAY?  You fall on your belly or have any high-impact accident or injury.  You have been a victim of domestic violence or any kind of violence.  You have been in a car accident.  You have bleeding from your vagina.  Fluid is leaking from your vagina.  You start to have belly cramping (contractions) or pain.  You feel weak or pass out (faint).  You start to throw up (vomit) after an injury.  You have been burned.  You have a stiff neck or neck pain.  You get a headache or have vision problems after an injury.  You do not feel the baby move or the baby is not moving as much as normal.   This information is not intended to replace advice given to you by your health care provider. Make sure you discuss any questions you have with your health care provider.   Document Released: 03/11/2010 Document Revised: 02/27/2014 Document Reviewed:  11/13/2012 Elsevier Interactive Patient Education [employer].
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
## 2014-12-16 ENCOUNTER — Encounter (HOSPITAL_COMMUNITY): Payer: Self-pay | Admitting: *Deleted

## 2014-12-16 ENCOUNTER — Inpatient Hospital Stay (HOSPITAL_COMMUNITY)
Admission: AD | Admit: 2014-12-16 | Discharge: 2014-12-16 | Disposition: A | Discharge status: Home / Self Care | Payer: 59 | Source: Ambulatory Visit | Attending: Obstetrics and Gynecology | Admitting: Obstetrics and Gynecology

## 2014-12-16 DIAGNOSIS — O26893 Other specified pregnancy related conditions, third trimester: Secondary | ICD-10-CM | POA: Diagnosis not present

## 2014-12-16 DIAGNOSIS — Z3A33 33 weeks gestation of pregnancy: Secondary | ICD-10-CM | POA: Diagnosis not present

## 2014-12-16 DIAGNOSIS — R109 Unspecified abdominal pain: Secondary | ICD-10-CM | POA: Insufficient documentation

## 2014-12-16 DIAGNOSIS — N898 Other specified noninflammatory disorders of vagina: Secondary | ICD-10-CM | POA: Insufficient documentation

## 2014-12-16 LAB — URINALYSIS, ROUTINE W REFLEX MICROSCOPIC
BILIRUBIN URINE: NEGATIVE
Glucose, UA: NEGATIVE mg/dL
Hgb urine dipstick: NEGATIVE
KETONES UR: NEGATIVE mg/dL
LEUKOCYTES UA: NEGATIVE
NITRITE: NEGATIVE
PH: 7 (ref 5.0–8.0)
PROTEIN: NEGATIVE mg/dL
Specific Gravity, Urine: 1.02 (ref 1.005–1.030)
UROBILINOGEN UA: 0.2 mg/dL (ref 0.0–1.0)

## 2014-12-16 NOTE — MAU Provider Note (Signed)
History     CSN: [id number]  Arrival date and time: 12/16/14 1703   First Provider Initiated Contact with Patient 12/16/14 1739      Chief Complaint  Patient presents with  . Rupture of Membranes   HPI THis is a 39 y.o. female at [redacted]w[redacted]d who presents with c/o small amounts of clear fluid leaking from vagina. Started this morning.  Never filled a pad, only moistened minipad.  No bleeding. Occasional cramps. Primary MD is Dr Gaetano Net.   RN Note: presents to MAU with complaints of leakage of clear fluid since this morning. Mild abdominal cramping. Denies any vaginal bleeding        OB History    Gravida Para Term Preterm AB TAB SAB Ectopic Multiple Living   3 2 1 1      2       Past Medical History  Diagnosis Date  . PONV (postoperative nausea and vomiting)   . Headache(784.0)   . GERD (gastroesophageal reflux disease)     tums prn-with pregnancy only  . Placenta previa 2012    with current pregnancy-type and cross 2 Units per protocol  . Anemia   . Depression   . Migraines   . IBS (irritable bowel syndrome)   . Chicken pox   . Syringomyelia, acquired [facility])     Past Surgical History  Procedure Laterality Date  . Arthroscopic knee  1990  . Left breast reduction  1996  . Appendectomy  2005  . Cesarean section  12/27/2010    Procedure: CESAREAN SECTION;  Surgeon: Shon Millet II;  Location: [place] [facility];  Service: Gynecology;  Laterality: N/A;  . Wisdom tooth extraction      Family History  Problem Relation Age of Onset  . High blood pressure Mother   . Arthritis Mother   . Hypertension Mother   . High blood pressure Brother   . Alcohol abuse Father   . Arthritis Father     Social History  Substance Use Topics  . Smoking status: Never Smoker   . Smokeless tobacco: Never Used  . Alcohol Use: No     Comment: rarely, but not while preg    Allergies:  Allergies  Allergen Reactions  . Topamax [Topiramate] Other (See Comments)    Pt states that she has  trouble recalling words when she takes this medication.      Prescriptions prior to admission  Medication Sig Dispense Refill Last Dose  . acetaminophen (TYLENOL) 325 MG tablet Take 325 mg by mouth every 6 (six) hours as needed for mild pain or headache.    Past Month at Unknown time  . FIBER SELECT GUMMIES PO Take 2 each by mouth at bedtime.   11/28/2014 at Unknown time  . Prenatal Vit-Fe Fumarate-FA (PRENATAL MULTIVITAMIN) TABS tablet Take 1 tablet by mouth at bedtime.   11/28/2014 at Unknown time    Review of Systems  Constitutional: Negative for fever, chills and malaise/fatigue.  Gastrointestinal: Negative for nausea, vomiting, abdominal pain, diarrhea and constipation.  Genitourinary:       Vaginal discharge  Musculoskeletal: Negative for myalgias.   Physical Exam   Blood pressure 130/63, pulse 83, temperature 97.9 F (36.6 C), resp. rate 18, last menstrual period 04/25/2014.  Physical Exam  Constitutional: She is oriented to person, place, and time. She appears well-developed and well-nourished. No distress.  HENT:  Head: Normocephalic.  Cardiovascular: Normal rate and regular rhythm.   Respiratory: Effort normal. No respiratory distress.  GI: Soft. She  exhibits no distension. There is no tenderness. There is no rebound and no guarding.  Genitourinary: Vaginal discharge found.  Speculum exam:  Minimal discharge, white No pooling, no ferning Dilation: Closed Effacement (%): Thick Station: Ballotable Exam by:: Hansel Feinstein, CNM   Musculoskeletal: Normal range of motion.  Neurological: She is alert and oriented to person, place, and time.  Skin: Skin is warm and dry.  Psychiatric: She has a normal mood and affect.   Fetal heart rate pattern reactive, Category I Some irritability  MAU Course  Procedures  MDM Consulted Dr Radene Knee regarding presentation, symptoms, and exam findings He recommends discharge home  Assessment and Plan  A:  SIUP at [redacted]w[redacted]d      Vaginal  discharge       No evidence of ruptured membranes P:  Discharge home       Labor precautions       Follow up in office for routine office visit  [facility] 12/16/2014, 5:40 PM

## 2014-12-16 NOTE — MAU Note (Signed)
Pt presents to MAU with complaints of leakage of clear fluid since this morning. Mild abdominal cramping. Denies any vaginal bleeding

## 2014-12-16 NOTE — Discharge Instructions (Signed)

## 2015-02-01 ENCOUNTER — Encounter (HOSPITAL_COMMUNITY): Payer: Self-pay

## 2015-02-01 ENCOUNTER — Encounter (HOSPITAL_COMMUNITY)
Admission: RE | Admit: 2015-02-01 | Discharge: 2015-02-01 | Disposition: A | Discharge status: Home / Self Care | Payer: 59 | Source: Ambulatory Visit | Attending: Obstetrics and Gynecology | Admitting: Obstetrics and Gynecology

## 2015-02-01 HISTORY — DX: Anxiety disorder, unspecified: F41.9

## 2015-02-01 HISTORY — DX: Reserved for inherently not codable concepts without codable children: IMO0001

## 2015-02-01 LAB — CBC
HCT: 31.1 % — ABNORMAL LOW (ref 36.0–46.0)
Hemoglobin: 10.1 g/dL — ABNORMAL LOW (ref 12.0–15.0)
MCH: 27.2 pg (ref 26.0–34.0)
MCHC: 32.5 g/dL (ref 30.0–36.0)
MCV: 83.8 fL (ref 78.0–100.0)
PLATELETS: 259 10*3/uL (ref 150–400)
RBC: 3.71 MIL/uL — AB (ref 3.87–5.11)
RDW: 14.4 % (ref 11.5–15.5)
WBC: 10.8 10*3/uL — ABNORMAL HIGH (ref 4.0–10.5)

## 2015-02-01 NOTE — Patient Instructions (Signed)
Your procedure is scheduled on: February 03, 2015  Enter through the Main Entrance of [facility] at: 11:00 am   Pick up the phone at the desk and dial [phone].  Call this number if you have problems the morning of surgery: [phone].  Remember: Do NOT eat food: after midnight on Tuesday  Do NOT drink clear liquids after: 8:30 am day of surgery  Take these medicines the morning of surgery with a SIP OF WATER: none   Do NOT wear jewelry (body piercing), metal hair clips/bobby pins, or nail polish. Do NOT wear lotions, powders, or perfumes.  You may wear deoderant. Do NOT shave for 48 hours prior to surgery. Do NOT bring valuables to the hospital. Leave suitcase in car.  After surgery it may be brought to your room.  For patients admitted to the hospital, checkout time is 11:00 AM the day of discharge.

## 2015-02-02 ENCOUNTER — Encounter (HOSPITAL_COMMUNITY): Payer: Self-pay | Admitting: Anesthesiology

## 2015-02-02 LAB — RPR: RPR: NONREACTIVE

## 2015-02-02 MED ORDER — DEXTROSE 5 % IV SOLN
3.0000 g | INTRAVENOUS | Status: DC
  Filled 2015-02-02: qty 3000

## 2015-02-02 NOTE — Anesthesia Preprocedure Evaluation (Addendum)
Anesthesia Evaluation  Patient identified by MRN, date of birth, ID band Patient awake    Reviewed: Allergy & Precautions, NPO status , Patient's Chart, lab work & pertinent test results  History of Anesthesia Complications (+) PONV and history of anesthetic complications  Airway Mallampati: III  TM Distance: >3 FB Neck ROM: Full    Dental no notable dental hx. (+) Teeth Intact   Pulmonary shortness of breath,    Pulmonary exam normal breath sounds clear to auscultation       Cardiovascular negative cardio ROS Normal cardiovascular exam Rhythm:Regular Rate:Normal     Neuro/Psych  Headaches, PSYCHIATRIC DISORDERS Anxiety Depression Hx/o Syringomyelia    GI/Hepatic Neg liver ROS, GERD  Medicated and Controlled,  Endo/Other  Obesity  Renal/GU negative Renal ROS  negative genitourinary   Musculoskeletal negative musculoskeletal ROS (+)   Abdominal (+) + obese,   Peds  Hematology  (+) anemia ,   Anesthesia Other Findings   Reproductive/Obstetrics (+) Pregnancy IUP - Term Desires sterilization                            Anesthesia Physical Anesthesia Plan  ASA: II  Anesthesia Plan: Spinal   Post-op Pain Management:    Induction:   Airway Management Planned: Natural Airway  Additional Equipment:   Intra-op Plan:   Post-operative Plan:   Informed Consent: I have reviewed the patients History and Physical, chart, labs and discussed the procedure including the risks, benefits and alternatives for the proposed anesthesia with the patient or authorized representative who has indicated his/her understanding and acceptance.     Plan Discussed with: Anesthesiologist, CRNA and Surgeon  Anesthesia Plan Comments:         Anesthesia Quick Evaluation

## 2015-02-02 NOTE — H&P (Signed)
[name] is a 39 y.o. female presenting for repeat cesarean section and BTL. History OB History    Gravida Para Term Preterm AB TAB SAB Ectopic Multiple Living   3 2 1 1      2      Past Medical History  Diagnosis Date  . PONV (postoperative nausea and vomiting)   . Headache(784.0)   . GERD (gastroesophageal reflux disease)     tums prn-with pregnancy only  . Placenta previa 2012    with current pregnancy-type and cross 2 Units per protocol  . Anemia   . Depression   . Migraines   . IBS (irritable bowel syndrome)   . Chicken pox   . Syringomyelia, acquired ([place])   . Shortness of breath dyspnea     with this pregnancy   . Anxiety    Past Surgical History  Procedure Laterality Date  . Arthroscopic knee  1990  . Left breast reduction  1996  . Appendectomy  2005  . Cesarean section  12/27/2010    Procedure: CESAREAN SECTION;  Surgeon: Shon Millet II;  Location: [place] [facility];  Service: Gynecology;  Laterality: N/A;  . Wisdom tooth extraction     Family History: family history includes Alcohol abuse in her father; Arthritis in her father and mother; High blood pressure in her brother and mother; Hypertension in her mother. Social History:  reports that she has never smoked. She has never used smokeless tobacco. She reports that she does not drink alcohol or use illicit drugs.   Prenatal Transfer Tool  Maternal Diabetes: No Genetic Screening: Normal Maternal Ultrasounds/Referrals: Normal Fetal Ultrasounds or other Referrals:  None Maternal Substance Abuse:  No Significant Maternal Medications:  None Significant Maternal Lab Results:  None Other Comments:  None  Review of Systems  Eyes: Negative for blurred vision.  Gastrointestinal: Negative for abdominal pain.  Neurological: Negative for headaches.      Last menstrual period 04/25/2014. Maternal Exam:  Abdomen: Patient reports no abdominal tenderness.   Physical Exam  Cardiovascular: Normal rate.    Respiratory: Effort normal.  GI: Soft.  Neurological: She has normal reflexes.    Prenatal labs: ABO, Rh: --/--/B POS (12/12 1040) Antibody: NEG (12/12 1040) Rubella: Immune (05/05 0000) RPR: Non Reactive (12/12 1040)  HBsAg: Negative (05/05 0000)  HIV: Non-reactive (05/05 0000)  GBS:     Assessment/Plan: 39 yo G3P2 desires repeat cesarean section and bilateral tuba ligation Reviewed with patient risks including infection, organ damage, bleeding/transfusion-HIV/Hep, DVT/PE, pneumonia, permanence of BTl, Failure rate, increased ectopic risks, and alternatives.     Italo Banton II,Swain Acree E 02/02/2015, 4:52 PM

## 2015-02-03 ENCOUNTER — Inpatient Hospital Stay (HOSPITAL_COMMUNITY): Payer: 59 | Admitting: Anesthesiology

## 2015-02-03 ENCOUNTER — Encounter (HOSPITAL_COMMUNITY): Payer: Self-pay | Admitting: *Deleted

## 2015-02-03 ENCOUNTER — Encounter (HOSPITAL_COMMUNITY)
Admission: AD | Disposition: A | Discharge status: Home / Self Care | Payer: Self-pay | Source: Ambulatory Visit | Attending: Obstetrics and Gynecology

## 2015-02-03 ENCOUNTER — Inpatient Hospital Stay (HOSPITAL_COMMUNITY)
Admission: AD | Admit: 2015-02-03 | Discharge: 2015-02-06 | DRG: 766 | Disposition: A | Discharge status: Home / Self Care | Payer: 59 | Source: Ambulatory Visit | Attending: Obstetrics and Gynecology | Admitting: Obstetrics and Gynecology

## 2015-02-03 DIAGNOSIS — O9902 Anemia complicating childbirth: Secondary | ICD-10-CM | POA: Diagnosis present

## 2015-02-03 DIAGNOSIS — O34211 Maternal care for low transverse scar from previous cesarean delivery: Principal | ICD-10-CM | POA: Diagnosis present

## 2015-02-03 DIAGNOSIS — Z6835 Body mass index (BMI) 35.0-35.9, adult: Secondary | ICD-10-CM

## 2015-02-03 DIAGNOSIS — Z3A41 41 weeks gestation of pregnancy: Secondary | ICD-10-CM

## 2015-02-03 DIAGNOSIS — F329 Major depressive disorder, single episode, unspecified: Secondary | ICD-10-CM | POA: Diagnosis present

## 2015-02-03 DIAGNOSIS — O9962 Diseases of the digestive system complicating childbirth: Secondary | ICD-10-CM | POA: Diagnosis present

## 2015-02-03 DIAGNOSIS — Z302 Encounter for sterilization: Secondary | ICD-10-CM

## 2015-02-03 DIAGNOSIS — O99214 Obesity complicating childbirth: Secondary | ICD-10-CM | POA: Diagnosis present

## 2015-02-03 DIAGNOSIS — F419 Anxiety disorder, unspecified: Secondary | ICD-10-CM | POA: Diagnosis present

## 2015-02-03 DIAGNOSIS — Z8249 Family history of ischemic heart disease and other diseases of the circulatory system: Secondary | ICD-10-CM | POA: Diagnosis not present

## 2015-02-03 DIAGNOSIS — R0602 Shortness of breath: Secondary | ICD-10-CM | POA: Diagnosis present

## 2015-02-03 DIAGNOSIS — Z811 Family history of alcohol abuse and dependence: Secondary | ICD-10-CM

## 2015-02-03 DIAGNOSIS — O99344 Other mental disorders complicating childbirth: Secondary | ICD-10-CM | POA: Diagnosis present

## 2015-02-03 DIAGNOSIS — E669 Obesity, unspecified: Secondary | ICD-10-CM | POA: Diagnosis present

## 2015-02-03 DIAGNOSIS — D649 Anemia, unspecified: Secondary | ICD-10-CM | POA: Diagnosis present

## 2015-02-03 DIAGNOSIS — K219 Gastro-esophageal reflux disease without esophagitis: Secondary | ICD-10-CM | POA: Diagnosis present

## 2015-02-03 DIAGNOSIS — Z9049 Acquired absence of other specified parts of digestive tract: Secondary | ICD-10-CM

## 2015-02-03 DIAGNOSIS — Z98891 History of uterine scar from previous surgery: Secondary | ICD-10-CM

## 2015-02-03 HISTORY — PX: TUBAL LIGATION: SHX77

## 2015-02-03 LAB — PREPARE RBC (CROSSMATCH)

## 2015-02-03 SURGERY — Surgical Case
Anesthesia: Spinal

## 2015-02-03 MED ORDER — FENTANYL CITRATE (PF) 100 MCG/2ML IJ SOLN
INTRAMUSCULAR | Status: AC
  Filled 2015-02-03: qty 2

## 2015-02-03 MED ORDER — SIMETHICONE 80 MG PO CHEW: 80.0000 mg | CHEWABLE_TABLET | ORAL | Status: DC | PRN

## 2015-02-03 MED ORDER — KETOROLAC TROMETHAMINE 30 MG/ML IJ SOLN
INTRAMUSCULAR | Status: AC
  Filled 2015-02-03: qty 1

## 2015-02-03 MED ORDER — ACETAMINOPHEN 325 MG PO TABS: 650.0000 mg | ORAL_TABLET | ORAL | Status: DC | PRN

## 2015-02-03 MED ORDER — OXYTOCIN 10 UNIT/ML IJ SOLN
INTRAMUSCULAR | Status: AC
  Filled 2015-02-03: qty 4

## 2015-02-03 MED ORDER — KETOROLAC TROMETHAMINE 30 MG/ML IJ SOLN
30.0000 mg | Freq: Four times a day (QID) | INTRAMUSCULAR | Status: AC | PRN
  Administered 2015-02-03: 30 mg via INTRAMUSCULAR

## 2015-02-03 MED ORDER — ONDANSETRON HCL 4 MG/2ML IJ SOLN
4.0000 mg | Freq: Three times a day (TID) | INTRAMUSCULAR | Status: DC | PRN
Start: 2015-02-03 — End: 2015-02-06

## 2015-02-03 MED ORDER — NALOXONE HCL 0.4 MG/ML IJ SOLN: 0.4000 mg | INTRAMUSCULAR | Status: DC | PRN

## 2015-02-03 MED ORDER — LANOLIN HYDROUS EX OINT: 1.0000 "application " | TOPICAL_OINTMENT | CUTANEOUS | Status: DC | PRN

## 2015-02-03 MED ORDER — MEPERIDINE HCL 25 MG/ML IJ SOLN
INTRAMUSCULAR | Status: AC
  Filled 2015-02-03: qty 1

## 2015-02-03 MED ORDER — SENNOSIDES-DOCUSATE SODIUM 8.6-50 MG PO TABS
2.0000 | ORAL_TABLET | ORAL | Status: DC
  Administered 2015-02-04 – 2015-02-05 (×3): 2 via ORAL
  Filled 2015-02-03 (×3): qty 2

## 2015-02-03 MED ORDER — FENTANYL CITRATE (PF) 100 MCG/2ML IJ SOLN: 25.0000 ug | INTRAMUSCULAR | Status: DC | PRN

## 2015-02-03 MED ORDER — NALOXONE HCL 2 MG/2ML IJ SOSY
1.0000 ug/kg/h | PREFILLED_SYRINGE | INTRAVENOUS | Status: DC | PRN
  Filled 2015-02-03: qty 2

## 2015-02-03 MED ORDER — FENTANYL CITRATE (PF) 100 MCG/2ML IJ SOLN
INTRAMUSCULAR | Status: DC | PRN
  Administered 2015-02-03: 20 ug via INTRATHECAL

## 2015-02-03 MED ORDER — DIBUCAINE 1 % RE OINT: 1.0000 "application " | TOPICAL_OINTMENT | RECTAL | Status: DC | PRN

## 2015-02-03 MED ORDER — ONDANSETRON HCL 4 MG/2ML IJ SOLN
INTRAMUSCULAR | Status: DC | PRN
  Administered 2015-02-03: 4 mg via INTRAVENOUS

## 2015-02-03 MED ORDER — SODIUM CHLORIDE 0.9 % IR SOLN
Status: DC | PRN
  Administered 2015-02-03: 1000 mL

## 2015-02-03 MED ORDER — OXYTOCIN 10 UNIT/ML IJ SOLN
40.0000 [IU] | INTRAVENOUS | Status: DC | PRN
  Administered 2015-02-03: 40 [IU] via INTRAVENOUS

## 2015-02-03 MED ORDER — CEFAZOLIN SODIUM-DEXTROSE 2-3 GM-% IV SOLR
2.0000 g | Freq: Once | INTRAVENOUS | Status: AC
  Administered 2015-02-03: 2 g via INTRAVENOUS

## 2015-02-03 MED ORDER — MORPHINE SULFATE (PF) 0.5 MG/ML IJ SOLN
INTRAMUSCULAR | Status: DC | PRN
  Administered 2015-02-03: .2 mg via INTRATHECAL

## 2015-02-03 MED ORDER — IBUPROFEN 600 MG PO TABS: 600.0000 mg | ORAL_TABLET | Freq: Four times a day (QID) | ORAL | Status: DC | PRN

## 2015-02-03 MED ORDER — SIMETHICONE 80 MG PO CHEW
80.0000 mg | CHEWABLE_TABLET | Freq: Three times a day (TID) | ORAL | Status: DC
  Administered 2015-02-04 – 2015-02-06 (×7): 80 mg via ORAL
  Filled 2015-02-03 (×7): qty 1

## 2015-02-03 MED ORDER — ONDANSETRON HCL 4 MG/2ML IJ SOLN
INTRAMUSCULAR | Status: AC
  Filled 2015-02-03: qty 2

## 2015-02-03 MED ORDER — SCOPOLAMINE 1 MG/3DAYS TD PT72
1.0000 | MEDICATED_PATCH | Freq: Once | TRANSDERMAL | Status: DC
  Administered 2015-02-03: 1.5 mg via TRANSDERMAL

## 2015-02-03 MED ORDER — LACTATED RINGERS IV SOLN
INTRAVENOUS | Status: DC
  Administered 2015-02-03: 125 mL/h via INTRAVENOUS
  Administered 2015-02-04: 01:00:00 via INTRAVENOUS

## 2015-02-03 MED ORDER — MEPERIDINE HCL 25 MG/ML IJ SOLN
INTRAMUSCULAR | Status: DC | PRN
  Administered 2015-02-03 (×2): 12.5 mg via INTRAVENOUS

## 2015-02-03 MED ORDER — PHENYLEPHRINE 8 MG IN D5W 100 ML (0.08MG/ML) PREMIX OPTIME
INJECTION | INTRAVENOUS | Status: DC | PRN
  Administered 2015-02-03: 60 ug/min via INTRAVENOUS

## 2015-02-03 MED ORDER — DIPHENHYDRAMINE HCL 50 MG/ML IJ SOLN: 12.5000 mg | INTRAMUSCULAR | Status: DC | PRN

## 2015-02-03 MED ORDER — OXYTOCIN 40 UNITS IN LACTATED RINGERS INFUSION - SIMPLE MED: 62.5000 mL/h | INTRAVENOUS | Status: AC

## 2015-02-03 MED ORDER — DIPHENHYDRAMINE HCL 25 MG PO CAPS: 25.0000 mg | ORAL_CAPSULE | ORAL | Status: DC | PRN

## 2015-02-03 MED ORDER — DIPHENHYDRAMINE HCL 25 MG PO CAPS: 25.0000 mg | ORAL_CAPSULE | Freq: Four times a day (QID) | ORAL | Status: DC | PRN

## 2015-02-03 MED ORDER — SIMETHICONE 80 MG PO CHEW
80.0000 mg | CHEWABLE_TABLET | ORAL | Status: DC
  Administered 2015-02-04 – 2015-02-05 (×3): 80 mg via ORAL
  Filled 2015-02-03 (×3): qty 1

## 2015-02-03 MED ORDER — SODIUM CHLORIDE 0.9 % IJ SOLN: 3.0000 mL | INTRAMUSCULAR | Status: DC | PRN

## 2015-02-03 MED ORDER — LACTATED RINGERS IV SOLN
INTRAVENOUS | Status: DC | PRN
  Administered 2015-02-03: 13:00:00 via INTRAVENOUS

## 2015-02-03 MED ORDER — ZOLPIDEM TARTRATE 5 MG PO TABS: 5.0000 mg | ORAL_TABLET | Freq: Every evening | ORAL | Status: DC | PRN

## 2015-02-03 MED ORDER — OXYCODONE-ACETAMINOPHEN 5-325 MG PO TABS
2.0000 | ORAL_TABLET | ORAL | Status: DC | PRN
  Administered 2015-02-05 (×2): 2 via ORAL
  Filled 2015-02-03 (×2): qty 2

## 2015-02-03 MED ORDER — OXYCODONE-ACETAMINOPHEN 5-325 MG PO TABS
1.0000 | ORAL_TABLET | ORAL | Status: DC | PRN
  Administered 2015-02-04 – 2015-02-06 (×4): 1 via ORAL
  Filled 2015-02-03 (×4): qty 1

## 2015-02-03 MED ORDER — IBUPROFEN 600 MG PO TABS
600.0000 mg | ORAL_TABLET | Freq: Four times a day (QID) | ORAL | Status: DC
  Administered 2015-02-04 – 2015-02-06 (×11): 600 mg via ORAL
  Filled 2015-02-03 (×11): qty 1

## 2015-02-03 MED ORDER — LACTATED RINGERS IV SOLN
INTRAVENOUS | Status: DC
  Administered 2015-02-03 (×3): via INTRAVENOUS

## 2015-02-03 MED ORDER — BUPIVACAINE IN DEXTROSE 0.75-8.25 % IT SOLN
INTRATHECAL | Status: DC | PRN
  Administered 2015-02-03: 1.8 mL via INTRATHECAL

## 2015-02-03 MED ORDER — KETOROLAC TROMETHAMINE 30 MG/ML IJ SOLN: 30.0000 mg | Freq: Four times a day (QID) | INTRAMUSCULAR | Status: AC | PRN

## 2015-02-03 MED ORDER — PRENATAL MULTIVITAMIN CH
1.0000 | ORAL_TABLET | Freq: Every day | ORAL | Status: DC
  Administered 2015-02-04 – 2015-02-06 (×3): 1 via ORAL
  Filled 2015-02-03 (×3): qty 1

## 2015-02-03 MED ORDER — SCOPOLAMINE 1 MG/3DAYS TD PT72
MEDICATED_PATCH | TRANSDERMAL | Status: AC
Start: 2015-02-03 — End: 2015-02-06
  Administered 2015-02-03: 1.5 mg via TRANSDERMAL
  Filled 2015-02-03: qty 1

## 2015-02-03 MED ORDER — MEPERIDINE HCL 25 MG/ML IJ SOLN: 6.2500 mg | INTRAMUSCULAR | Status: DC | PRN

## 2015-02-03 MED ORDER — MORPHINE SULFATE (PF) 0.5 MG/ML IJ SOLN
INTRAMUSCULAR | Status: AC
  Filled 2015-02-03: qty 10

## 2015-02-03 MED ORDER — NALBUPHINE HCL 10 MG/ML IJ SOLN: 5.0000 mg | Freq: Once | INTRAMUSCULAR | Status: DC | PRN

## 2015-02-03 MED ORDER — MENTHOL 3 MG MT LOZG: 1.0000 | LOZENGE | OROMUCOSAL | Status: DC | PRN

## 2015-02-03 MED ORDER — PHENYLEPHRINE 8 MG IN D5W 100 ML (0.08MG/ML) PREMIX OPTIME
INJECTION | INTRAVENOUS | Status: AC
  Filled 2015-02-03: qty 100

## 2015-02-03 MED ORDER — CEFAZOLIN SODIUM-DEXTROSE 2-3 GM-% IV SOLR
INTRAVENOUS | Status: AC
  Filled 2015-02-03: qty 50

## 2015-02-03 MED ORDER — WITCH HAZEL-GLYCERIN EX PADS: 1.0000 "application " | MEDICATED_PAD | CUTANEOUS | Status: DC | PRN

## 2015-02-03 MED ORDER — NALBUPHINE HCL 10 MG/ML IJ SOLN: 5.0000 mg | INTRAMUSCULAR | Status: DC | PRN

## 2015-02-03 MED ORDER — TETANUS-DIPHTH-ACELL PERTUSSIS 5-2.5-18.5 LF-MCG/0.5 IM SUSP: 0.5000 mL | Freq: Once | INTRAMUSCULAR | Status: DC

## 2015-02-03 SURGICAL SUPPLY — 36 items
APL SKNCLS STERI-STRIP NONHPOA (GAUZE/BANDAGES/DRESSINGS) ×2
BENZOIN TINCTURE PRP APPL 2/3 (GAUZE/BANDAGES/DRESSINGS) ×1 IMPLANT
CLAMP CORD UMBIL (MISCELLANEOUS) IMPLANT
CLOTH BEACON ORANGE TIMEOUT ST (SAFETY) ×3 IMPLANT
CONTAINER PREFILL 10% NBF 15ML (MISCELLANEOUS) IMPLANT
DRAPE SHEET LG 3/4 BI-LAMINATE (DRAPES) IMPLANT
DRSG OPSITE POSTOP 4X10 (GAUZE/BANDAGES/DRESSINGS) ×3 IMPLANT
DURAPREP 26ML APPLICATOR (WOUND CARE) ×3 IMPLANT
ELECT REM PT RETURN 9FT ADLT (ELECTROSURGICAL) ×3
ELECTRODE REM PT RTRN 9FT ADLT (ELECTROSURGICAL) ×2 IMPLANT
EXTRACTOR VACUUM M CUP 4 TUBE (SUCTIONS) IMPLANT
GLOVE BIO SURGEON STRL SZ8 (GLOVE) ×3 IMPLANT
GLOVE BIOGEL PI IND STRL 7.0 (GLOVE) ×2 IMPLANT
GLOVE BIOGEL PI INDICATOR 7.0 (GLOVE) ×1
GOWN STRL REUS W/TWL LRG LVL3 (GOWN DISPOSABLE) ×6 IMPLANT
KIT ABG SYR 3ML LUER SLIP (SYRINGE) ×3 IMPLANT
LIQUID BAND (GAUZE/BANDAGES/DRESSINGS) IMPLANT
NDL HYPO 25X5/8 SAFETYGLIDE (NEEDLE) ×2 IMPLANT
NEEDLE HYPO 22GX1.5 SAFETY (NEEDLE) ×3 IMPLANT
NEEDLE HYPO 25X5/8 SAFETYGLIDE (NEEDLE) ×3 IMPLANT
NS IRRIG 1000ML POUR BTL (IV SOLUTION) ×3 IMPLANT
PACK C SECTION WH (CUSTOM PROCEDURE TRAY) ×3 IMPLANT
PAD OB MATERNITY 4.3X12.25 (PERSONAL CARE ITEMS) ×3 IMPLANT
PENCIL SMOKE EVAC W/HOLSTER (ELECTROSURGICAL) ×3 IMPLANT
STRIP CLOSURE SKIN 1/2X4 (GAUZE/BANDAGES/DRESSINGS) IMPLANT
STRIP CLOSURE SKIN 1/4X4 (GAUZE/BANDAGES/DRESSINGS) ×1 IMPLANT
SUT MNCRL 0 VIOLET CTX 36 (SUTURE) ×8 IMPLANT
SUT MONOCRYL 0 CTX 36 (SUTURE) ×4
SUT PDS AB 0 CTX 60 (SUTURE) ×4 IMPLANT
SUT PLAIN 0 NONE (SUTURE) IMPLANT
SUT PLAIN 2 0 (SUTURE)
SUT PLAIN 2 0 XLH (SUTURE) ×3 IMPLANT
SUT PLAIN ABS 2-0 CT1 27XMFL (SUTURE) IMPLANT
SUT VIC AB 4-0 KS 27 (SUTURE) ×3 IMPLANT
TOWEL OR 17X24 6PK STRL BLUE (TOWEL DISPOSABLE) ×3 IMPLANT
TRAY FOLEY CATH SILVER 14FR (SET/KITS/TRAYS/PACK) ×3 IMPLANT

## 2015-02-03 NOTE — Transfer of Care (Signed)
Immediate Anesthesia Transfer of Care Note  Patient: [name]  Procedure(s) Performed: Procedure(s): CESAREAN SECTION (N/A) BILATERAL TUBAL LIGATION (Bilateral)  Patient Location: PACU  Anesthesia Type:Spinal  Level of Consciousness: awake, alert , oriented and patient cooperative  Airway & Oxygen Therapy: Patient Spontanous Breathing  Post-op Assessment: Report given to RN and Post -op Vital signs reviewed and stable  Post vital signs: Reviewed and stable  Last Vitals:  Filed Vitals:   02/03/15 1117  BP: 113/74  Pulse: 80  Temp: 36.6 C  Resp: 20    Complications: No apparent anesthesia complications

## 2015-02-03 NOTE — Anesthesia Procedure Notes (Signed)
Spinal Patient location during procedure: OR Start time: 02/03/2015 12:31 PM Staffing Anesthesiologist: Josephine Igo Performed by: anesthesiologist  Preanesthetic Checklist Completed: patient identified, site marked, surgical consent, pre-op evaluation, timeout performed, IV checked, risks and benefits discussed and monitors and equipment checked Spinal Block Patient position: sitting Prep: site prepped and draped and DuraPrep Patient monitoring: heart rate, cardiac monitor, continuous pulse ox and blood pressure Approach: midline Location: L3-4 Injection technique: single-shot Needle Needle type: Sprotte  Needle gauge: 24 G Needle length: 9 cm Needle insertion depth: 5 cm Assessment Sensory level: T4 Additional Notes Patient tolerated procedure well. Adequate sensory level.

## 2015-02-03 NOTE — Anesthesia Postprocedure Evaluation (Signed)
Anesthesia Post Note  Patient: EVERLINE FLOW  Procedure(s) Performed: Procedure(s) (LRB): CESAREAN SECTION (N/A) BILATERAL TUBAL LIGATION (Bilateral)  Patient location during evaluation: Mother Baby Anesthesia Type: Spinal Level of consciousness: awake and alert and oriented Pain management: satisfactory to patient Vital Signs Assessment: post-procedure vital signs reviewed and stable Respiratory status: respiratory function stable and spontaneous breathing Cardiovascular status: blood pressure returned to baseline Postop Assessment: no headache, no backache, spinal receding, patient able to bend at knees and adequate PO intake Anesthetic complications: no    Last Vitals:  Filed Vitals:   02/03/15 1630 02/03/15 1756  BP: 120/63 106/49  Pulse: 61 63  Temp: 36.9 C 36.8 C  Resp: 20 18    Last Pain:  Filed Vitals:   02/03/15 1801  PainSc: 0-No pain                 Kieon Lawhorn

## 2015-02-03 NOTE — Brief Op Note (Signed)
02/03/2015  1:25 PM  PATIENT:  Brand Males  39 y.o. female  PRE-OPERATIVE DIAGNOSIS:  previous X1, desires sterility  POST-OPERATIVE DIAGNOSIS:  previous X1, desires sterility  PROCEDURE:  Procedure(s): CESAREAN SECTION (N/A) BILATERAL TUBAL LIGATION (Bilateral)  SURGEON:  Surgeon(s) and Role:    * Everlene Farrier, MD - Primary  PHYSICIAN ASSISTANT:   ASSISTANTS: none   ANESTHESIA:   spinal  EBL:  Total I/O In: 3000 [I.V.:3000] Out: 1250 [Urine:450; Blood:800]  BLOOD ADMINISTERED:none  DRAINS: Urinary Catheter (Foley)   LOCAL MEDICATIONS USED:  NONE  SPECIMEN:  Source of Specimen:  bilateral fallopian segments  DISPOSITION OF SPECIMEN:  PATHOLOGY  COUNTS:  YES  TOURNIQUET:  * No tourniquets in log *  DICTATION: .Other Dictation: Dictation Number [id number]  PLAN OF CARE: Admit to inpatient   PATIENT DISPOSITION:  PACU - hemodynamically stable.   Delay start of Pharmacological VTE agent (>24hrs) due to surgical blood loss or risk of bleeding: not applicable

## 2015-02-03 NOTE — Progress Notes (Signed)
No changes to H&P per patient history Reviewed with patient procedure and risks-repeat cesarean section and BTL All questions answered and patient states she understands and agrees

## 2015-02-03 NOTE — Anesthesia Postprocedure Evaluation (Signed)
Anesthesia Post Note  Patient: [name]  Procedure(s) Performed: Procedure(s) (LRB): CESAREAN SECTION (N/A) BILATERAL TUBAL LIGATION (Bilateral)  Patient location during evaluation: PACU Anesthesia Type: Spinal Level of consciousness: oriented and awake and alert Pain management: pain level controlled Vital Signs Assessment: post-procedure vital signs reviewed and stable Respiratory status: spontaneous breathing, respiratory function stable and patient connected to nasal cannula oxygen Cardiovascular status: blood pressure returned to baseline and stable Postop Assessment: no headache, no backache, patient able to bend at knees, spinal receding and no signs of nausea or vomiting Anesthetic complications: no    Last Vitals:  Filed Vitals:   02/03/15 1454 02/03/15 1455  BP:    Pulse: 57 58  Temp:    Resp: 15 18    Last Pain:  Filed Vitals:   02/03/15 1457  PainSc: 0-No pain                 Rhilee Currin A.

## 2015-02-03 NOTE — Lactation Note (Addendum)
This note was copied from the chart of [place]. Lactation Consultation Note  Patient Name: [name] [id number] Date: 02/03/2015 Reason for consult: Initial assessment Baby at 4 hr of life and mom reports that bf is going well. She tried bf her older children but had issues with latch and "was under a lot of stress" that she thinks caused low milk supply. Mom did have L breast only reduction when she was 39 yr old, but reports that she could pump the same amount from both breast with the other children.. Discussed baby behavior, feeding frequency, baby belly size, voids, wt loss, breast changes, and nipple care. Mom stated that she can manually express and has seen colostrum bilaterally. Offered DEBP but she declined at this time because baby is latching well. Given lactation handouts. Aware of OP services and support group.    Maternal Data Has patient been taught Hand Expression?: Yes Does the patient have breastfeeding experience prior to this delivery?: Yes  Feeding Feeding Type: Breast Fed Length of feed: 40 min  LATCH Score/Interventions Latch: Grasps breast easily, tongue down, lips flanged, rhythmical sucking.  Audible Swallowing: A few with stimulation Intervention(s): Skin to skin  Type of Nipple: Everted at rest and after stimulation  Comfort (Breast/Nipple): Soft / non-tender     Hold (Positioning): Full assist, staff holds infant at breast  LATCH Score: 7  Lactation Tools Discussed/Used     Consult Status Consult Status: Follow-up Date: 02/04/15 Follow-up type: In-patient    Denzil Hughes 02/03/2015, 5:34 PM

## 2015-02-03 NOTE — Addendum Note (Signed)
Addendum  created 02/03/15 1823 by Flossie Dibble, CRNA   Modules edited: Notes Section   Notes Section:  File: [id number]

## 2015-02-03 NOTE — Progress Notes (Signed)
MOB was referred for history of depression/anxiety.  Referral is screened out by Clinical Social Worker because none of the following criteria appear to apply: -History of anxiety/depression during this pregnancy, or of post-partum depression. - Diagnosis of anxiety and/or depression within last 3 years or -MOB's symptoms are currently being treated with medication and/or therapy.  Per chart review, MOB diagnosed with depression and anxiety in 2001.  Depression and anxiety is no longer listed as a current problem on her prenatal records. No concerns noted during the pregnancy.   Please contact the Clinical Social Worker if needs arise or upon MOB request.   Lucita Ferrara MSW, LCSW [phone]

## 2015-02-04 LAB — CBC
HCT: 30 % — ABNORMAL LOW (ref 36.0–46.0)
Hemoglobin: 9.5 g/dL — ABNORMAL LOW (ref 12.0–15.0)
MCH: 27.1 pg (ref 26.0–34.0)
MCHC: 31.7 g/dL (ref 30.0–36.0)
MCV: 85.5 fL (ref 78.0–100.0)
PLATELETS: 240 10*3/uL (ref 150–400)
RBC: 3.51 MIL/uL — AB (ref 3.87–5.11)
RDW: 14.6 % (ref 11.5–15.5)
WBC: 11.1 10*3/uL — AB (ref 4.0–10.5)

## 2015-02-04 LAB — BIRTH TISSUE RECOVERY COLLECTION (PLACENTA DONATION)

## 2015-02-04 NOTE — Progress Notes (Signed)
UR chart review completed.  

## 2015-02-04 NOTE — Lactation Note (Signed)
This note was copied from the chart of [place]. Lactation Consultation Note  Mom is a little concerned about her milk supply because she had a history of low milk supply with her 1st two childre,  History was not consistent as to why her milk supply may have been low.  She has a history of breast reduction on the Lt breast however it has an abundance of glandular tissue.  Discussed strategies to support milk supply including feeding often, hand expressing 5 times in 24 hours and pumping to soften the breast if the baby does not do it.  Encouraged her to schedule an OP appointment prior to discharge.  Close follow-up and encouragement will be important for this dyad.  Patient Name: Boy Melessa Eckelman [id number] Date: 02/04/2015 Reason for consult: Follow-up assessment;Breast surgery;Other (Comment) (history of low milk supply)   Maternal Data Has patient been taught Hand Expression?: Yes  Feeding Feeding Type: Breast Fed Length of feed: 30 min  LATCH Score/Interventions Latch: Grasps breast easily, tongue down, lips flanged, rhythmical sucking.  Audible Swallowing: A few with stimulation Intervention(s): Alternate breast massage  Type of Nipple: Everted at rest and after stimulation  Comfort (Breast/Nipple): Filling, red/small blisters or bruises, mild/mod discomfort  Problem noted: Mild/Moderate discomfort Interventions (Mild/moderate discomfort): Comfort gels;Hand expression  Hold (Positioning): No assistance needed to correctly position infant at breast.  LATCH Score: 8  Lactation Tools Discussed/Used     Consult Status      Van Clines 02/04/2015, 2:40 PM

## 2015-02-04 NOTE — Progress Notes (Signed)
Subjective: Postpartum Day 1: Cesarean Delivery Patient reports tolerating PO, + flatus and no problems voiding.    Objective: Vital signs in last 24 hours: Temp:  [97.7 F (36.5 C)-98.7 F (37.1 C)] 98.2 F (36.8 C) (12/15 0810) Pulse Rate:  [53-80] 63 (12/15 0810) Resp:  [14-21] 18 (12/15 0810) BP: (83-120)/(42-74) 110/53 mmHg (12/15 0810) SpO2:  [94 %-99 %] 97 % (12/15 0410)  Physical Exam:  General: alert and cooperative Lochia: appropriate Uterine Fundus: firm Incision: scant drainage noted on honeycomb dressing DVT Evaluation: No evidence of DVT seen on physical exam. Negative Homan's sign. No cords or calf tenderness. Calf/Ankle edema is present.   Recent Labs  02/01/15 1040 02/04/15 0515  HGB 10.1* 9.5*  HCT 31.1* 30.0*    Assessment/Plan: Status post Cesarean section. Doing well postoperatively.  abd binder per patient request.  CURTIS,CAROL G 02/04/2015, 8:14 AM  Patient and husband are counseled for circ including risk of bleeding, infection and scarring.  All questions were answered and the patient wishes to proceed.  Linda Hedges, DO

## 2015-02-04 NOTE — Op Note (Signed)
[name], [name]              ACCOUNT NO.:  [id number]  MEDICAL RECORD NO.:  [id number]  LOCATION:  9125                          FACILITY:  [facility]  PHYSICIAN:  Daleen Bo. Gaetano Net, M.D. DATE OF BIRTH:  12-28-1975  DATE OF PROCEDURE:  02/03/2015 DATE OF DISCHARGE:                              OPERATIVE REPORT   PREOPERATIVE DIAGNOSES: 1. Previous cesarean section, desires repeat. 2. Desires permanent sterilization.  POSTOPERATIVE DIAGNOSES: 1. Previous cesarean section, desires repeat. 2. Desires permanent sterilization.  PROCEDURE:  Repeat low transverse cesarean section and bilateral tubal ligation.  SURGEON:  Daleen Bo. Gaetano Net, MD  ANESTHESIA:  Spinal, Venia Carbon. Royce Macadamia, MD  ESTIMATED BLOOD LOSS:  500 mL.  FINDINGS:  Viable female infant.  Apgars, arterial cord pH, birth weight pending.  SPECIMENS:  Bilateral fallopian tube segments to Pathology.  INDICATIONS AND CONSENT:  This patient is a 39 year old, multiparous female with previous cesarean section and desires repeat.  She is 40-4/7 weeks estimated gestational age.  She also desires permanent sterilization.  Potential risks and complications were reviewed preoperatively including, but not limited to, infection, organ damage, bleeding requiring transfusion of blood products with HIV and hepatitis acquisition, DVT, PE, pneumonia.  Permanence of tubal ligation, alternate methods of contraception, failure rate increasing, increased ectopic risk were also reviewed.  All questions were answered.  The patient states she understands and agrees and consent was signed on the chart.  PROCEDURE IN DETAIL:  The patient was taken to the operating room, where she was identified, spinal anesthetic was placed per Dr. Royce Macadamia, and she was placed in dorsal supine position with 15 degree left lateral wedge. She was prepped.  Foley catheter was placed and the bladder was drained. Time-out was undertaken.  She was draped in a sterile  fashion.  After testing for adequate spinal anesthesia, skin was entered through the previous Pfannenstiel scar and dissection was carried out in layers to the peritoneum.  Peritoneum was incised, extended superiorly and inferiorly.  Vesicouterine peritoneum was taken down cephalolaterally. Bladder flap was developed.  The bladder blade was placed.  Uterus was incised in a low transverse manner.  The uterine cavity was entered bluntly with a hemostat and clear fluid was noted.  The incision was extended cephalolaterally with the fingers.  Baby was then delivered from the vertex position without difficulty.  Good cry and tone was noted.  Cord was clamped and cut and the baby was handed to awaiting pediatrics team.  Placenta was delivered manually and a cavity was clean.  Uterus was closed in 2 running locking imbricating layers of 0 Monocryl suture which achieves good hemostasis.  The left fallopian tube was identified from cornu to fimbria.  It was grasped in its mid ampullary portion with Babcock clamp and doubly ligated with 2 free ties of plain suture.  The intervening knuckle was resected and hemostasis was assured with bipolar cautery.  Similar procedure was carried out on the right side.  Irrigation was done.  The anterior peritoneum was closed in running fashion with 0 Monocryl suture which was also used to reapproximate the pyramidalis muscle in midline.  Anterior rectus fascia was closed in running fashion with 0 looped PDS  suture.  Subcutaneous tissue was closed with interrupted plain and the skin was closed with subcuticular 4-0 Vicryl on a Keith needle.  Steri-Strips were applied. All counts were correct.  The patient was taken to recovery room in stable condition.     [name], M.D.     JET/MEDQ  D:  02/03/2015  T:  02/04/2015  Job:  [id number]

## 2015-02-05 ENCOUNTER — Encounter (HOSPITAL_COMMUNITY): Payer: Self-pay | Admitting: Obstetrics and Gynecology

## 2015-02-05 LAB — TYPE AND SCREEN
ABO/RH(D): B POS
Antibody Screen: NEGATIVE
UNIT DIVISION: 0
UNIT DIVISION: 0

## 2015-02-05 MED ORDER — HYDROCORTISONE 1 % EX CREA
TOPICAL_CREAM | Freq: Three times a day (TID) | CUTANEOUS | Status: DC
  Administered 2015-02-05 (×2): via TOPICAL
  Filled 2015-02-05 (×2): qty 28

## 2015-02-05 NOTE — Progress Notes (Signed)
Subjective: Postpartum Day 2: Cesarean Delivery Patient reports tolerating PO, + flatus and no problems voiding.  Patient reports itching on left margin of the incision  Objective: Vital signs in last 24 hours: Temp:  [97.7 F (36.5 C)] 97.7 F (36.5 C) (12/16 0537) Pulse Rate:  [57-61] 57 (12/16 0537) Resp:  [18] 18 (12/16 0537) BP: (113-122)/(47-65) 122/65 mmHg (12/16 0537)  Physical Exam:  General: alert and cooperative Lochia: appropriate Uterine Fundus: firm Incision: incision appears intact, small erythema noted on Left margin of the incision ,but appears to be a reaction to the adhesive DVT Evaluation: No evidence of DVT seen on physical exam. Negative Homan's sign. No cords or calf tenderness. Calf/Ankle edema is present.   Recent Labs  02/04/15 0515  HGB 9.5*  HCT 30.0*    Assessment/Plan: Status post Cesarean section. Doing well postoperatively.  Hydrocortisone topical to incision.  Amethyst Gainer G 02/05/2015, 8:48 AM

## 2015-02-05 NOTE — Clinical Documentation Improvement (Signed)
OB/GYN Please update your documentation within the medical record to reflect your response to this query.  Thank you  Can the abnormal lab values be further specified?   Acute Blood Loss Anemia, including the suspected or known cause or associated condition(s)  Acute on chronic blood loss anemia, including the suspected or known cause or associated condition(s)  Chronic blood loss anemia, including the suspected or known cause or associated condition(s)  Precipitous drop in Hematocrit, including the suspected or known cause or associated condition(s)  Other  Clinically Undetermined  Document any associated diagnoses/conditions.  Supporting Information: Results for COLUMBIA, BARBERENA (MRN [id number]) as of 02/05/2015 09:45  02/01/2015 10:40 02/04/2015 05:15  Hemoglobin 10.1 (L) 9.5 (L)  HCT 31.1 (L) 30.0 (L)  02/03/15 Op note.Marland KitchenMarland Kitchen"Repeat low transverse cesarean section and bilateral tubal ligation."..."ESTIMATED BLOOD LOSS: 500 mL.".Marland Kitchen  Please exercise your independent, professional judgment when responding. A specific answer is not anticipated or expected.  Thank You,  Ermelinda Das, RN, BSN, [place] Certified Clinical Documentation Specialist [place]: Health Information Management [phone]

## 2015-02-05 NOTE — Lactation Note (Signed)
This note was copied from the chart of [place]. Lactation Consultation Note  Patient Name: [name] [id number] Date: 02/05/2015 Reason for consult: Follow-up assessment RN requested [place] visit because the baby has been on the breast all day and is very fussy. Upon entry mom stated that she thinks baby has a frenulum issue. Baby can lift tongue past midline, and extend tongue over gum ridge but does have a noticeable lingual frenulum. Mom's nipples look like a tube of lipstick when the baby comes off. She reports mild discomfort. Bilateral bruising noted but no skin break down at this time. Reviewed getting a deep latch and taking baby off when he changes his tongue position. Mom lost her comfort gels and requested another set.  Discussed baby's wt loss and behavior. LC suggested that mom try manual expression after latching and offer whatever she can get. Mom does not like manual expression, she would rather pump. She began to cry and stated that she was in pain and very tired. LC suggested supplementing with Aluminium and parents agreed. RN was helping mom while FOB syring fed 20 ml of formula. Mom will bf on demand and FOB will f/u with formula as needed until mom's milk supply increases. They are aware of OP services and support group. She will call as needed for bf help.   Maternal Data    Feeding Feeding Type: Breast Fed Length of feed: 20 min  LATCH Score/Interventions Latch: Grasps breast easily, tongue down, lips flanged, rhythmical sucking.  Audible Swallowing: A few with stimulation Intervention(s): Alternate breast massage;Hand expression  Type of Nipple: Everted at rest and after stimulation  Comfort (Breast/Nipple): Filling, red/small blisters or bruises, mild/mod discomfort  Problem noted: Mild/Moderate discomfort;Cracked, bleeding, blisters, bruises Interventions  (Cracked/bleeding/bruising/blister): Double electric pump;Expressed breast milk to  nipple Interventions (Mild/moderate discomfort): Comfort gels;Hand expression;Post-pump  Hold (Positioning): No assistance needed to correctly position infant at breast.  LATCH Score: 8  Lactation Tools Discussed/Used Tools: 60F feeding tube / Syringe   Consult Status Consult Status: Follow-up Date: 02/06/15 Follow-up type: In-patient    Denzil Hughes 02/05/2015, 3:07 PM

## 2015-02-06 MED ORDER — OXYCODONE-ACETAMINOPHEN 5-325 MG PO TABS: 1.0000 | ORAL_TABLET | ORAL | Status: DC | PRN

## 2015-02-06 MED ORDER — IBUPROFEN 600 MG PO TABS: 600.0000 mg | ORAL_TABLET | Freq: Four times a day (QID) | ORAL | Status: DC | PRN

## 2015-02-06 NOTE — Discharge Summary (Signed)
Obstetric Discharge Summary Reason for Admission: cesarean section Prenatal Procedures: none Intrapartum Procedures: cesarean: low cervical, transverse and tubal ligation Postpartum Procedures: none Complications-Operative and Postpartum: none HEMOGLOBIN  Date Value Ref Range Status  02/04/2015 9.5* 12.0 - 15.0 g/dL Final   HCT  Date Value Ref Range Status  02/04/2015 30.0* 36.0 - 46.0 % Final    Physical Exam:  General: alert Lochia: appropriate Uterine Fundus: firm Incision: healing well DVT Evaluation: No evidence of DVT seen on physical exam.  Discharge Diagnoses: Term Pregnancy-delivered  Discharge Information: Date: 02/06/2015 Activity: pelvic rest Diet: routine Medications: PNV, Iron and Percocet Condition: stable Instructions: refer to practice specific booklet Discharge to: home Follow-up Information    Follow up with Margarette Asal, MD In 1 week.   Specialty:  Obstetrics and Gynecology   Contact information:   [place] [address] [phone]       Newborn Data: Live born female  Birth Weight: 8 lb 15.7 oz (4075 g) APGAR: 8, 9  Home with mother.  Margarette Asal 02/06/2015, 9:44 AM

## 2015-02-06 NOTE — Lactation Note (Signed)
This note was copied from the chart of [place]. Lactation Consultation Note  Patient Name: [name] [id number] Date: 02/06/2015 Reason for consult: Follow-up assessment   With this mom and term baby, now 58 hours old. Dad was finger feeding formula to the baby, and I showed him how to side lie the baby, and why. I also commented on how well he was doing with finger/syringe feeding.  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 She said no, she will see her lactation consultant at her pediatricians, and work with her.    Maternal Data    Feeding    LATCH Score/Interventions                      Lactation Tools Discussed/Used     Consult Status Consult Status: Complete    [name] 02/06/2015, 1:31 PM

## 2015-02-10 ENCOUNTER — Encounter (HOSPITAL_COMMUNITY): Payer: Self-pay | Admitting: Obstetrics and Gynecology

## 2015-03-16 DIAGNOSIS — Z130 Encounter for screening for diseases of the blood and blood-forming organs and certain disorders involving the immune mechanism: Secondary | ICD-10-CM | POA: Diagnosis not present

## 2015-03-16 DIAGNOSIS — Z13228 Encounter for screening for other metabolic disorders: Secondary | ICD-10-CM | POA: Diagnosis not present

## 2015-03-16 DIAGNOSIS — Z1389 Encounter for screening for other disorder: Secondary | ICD-10-CM | POA: Diagnosis not present

## 2015-03-16 MED FILL — SERTRALINE HCL 50 MG TABLET: 50 | 30 days supply | Qty: 30 | Fill #0

## 2015-03-17 ENCOUNTER — Other Ambulatory Visit (HOSPITAL_COMMUNITY): Payer: Self-pay | Admitting: Obstetrics and Gynecology

## 2015-03-17 DIAGNOSIS — R1011 Right upper quadrant pain: Secondary | ICD-10-CM

## 2015-03-24 ENCOUNTER — Ambulatory Visit (HOSPITAL_COMMUNITY)
Admission: RE | Admit: 2015-03-24 | Discharge: 2015-03-24 | Disposition: A | Discharge status: Home / Self Care | Payer: 59 | Source: Ambulatory Visit | Attending: Obstetrics and Gynecology | Admitting: Obstetrics and Gynecology

## 2015-03-24 DIAGNOSIS — R1011 Right upper quadrant pain: Secondary | ICD-10-CM | POA: Insufficient documentation

## 2015-04-07 DIAGNOSIS — F4310 Post-traumatic stress disorder, unspecified: Secondary | ICD-10-CM | POA: Diagnosis not present

## 2015-04-07 DIAGNOSIS — F411 Generalized anxiety disorder: Secondary | ICD-10-CM | POA: Diagnosis not present

## 2015-04-07 DIAGNOSIS — F321 Major depressive disorder, single episode, moderate: Secondary | ICD-10-CM | POA: Diagnosis not present

## 2015-04-13 DIAGNOSIS — F411 Generalized anxiety disorder: Secondary | ICD-10-CM | POA: Diagnosis not present

## 2015-04-13 DIAGNOSIS — F4310 Post-traumatic stress disorder, unspecified: Secondary | ICD-10-CM | POA: Diagnosis not present

## 2015-04-13 DIAGNOSIS — F321 Major depressive disorder, single episode, moderate: Secondary | ICD-10-CM | POA: Diagnosis not present

## 2015-04-22 DIAGNOSIS — F411 Generalized anxiety disorder: Secondary | ICD-10-CM | POA: Diagnosis not present

## 2015-04-22 DIAGNOSIS — F4310 Post-traumatic stress disorder, unspecified: Secondary | ICD-10-CM | POA: Diagnosis not present

## 2015-04-22 DIAGNOSIS — F321 Major depressive disorder, single episode, moderate: Secondary | ICD-10-CM | POA: Diagnosis not present

## 2015-05-06 DIAGNOSIS — F411 Generalized anxiety disorder: Secondary | ICD-10-CM | POA: Diagnosis not present

## 2015-05-06 DIAGNOSIS — F4310 Post-traumatic stress disorder, unspecified: Secondary | ICD-10-CM | POA: Diagnosis not present

## 2015-05-06 DIAGNOSIS — F321 Major depressive disorder, single episode, moderate: Secondary | ICD-10-CM | POA: Diagnosis not present

## 2015-05-11 DIAGNOSIS — H5213 Myopia, bilateral: Secondary | ICD-10-CM | POA: Diagnosis not present

## 2015-05-13 DIAGNOSIS — F4310 Post-traumatic stress disorder, unspecified: Secondary | ICD-10-CM | POA: Diagnosis not present

## 2015-05-13 DIAGNOSIS — F321 Major depressive disorder, single episode, moderate: Secondary | ICD-10-CM | POA: Diagnosis not present

## 2015-05-13 DIAGNOSIS — F411 Generalized anxiety disorder: Secondary | ICD-10-CM | POA: Diagnosis not present

## 2015-05-20 DIAGNOSIS — F321 Major depressive disorder, single episode, moderate: Secondary | ICD-10-CM | POA: Diagnosis not present

## 2015-05-20 DIAGNOSIS — F4310 Post-traumatic stress disorder, unspecified: Secondary | ICD-10-CM | POA: Diagnosis not present

## 2015-05-20 DIAGNOSIS — F411 Generalized anxiety disorder: Secondary | ICD-10-CM | POA: Diagnosis not present

## 2015-05-26 DIAGNOSIS — F53 Puerperal psychosis: Secondary | ICD-10-CM | POA: Diagnosis not present

## 2015-05-26 MED FILL — SERTRALINE HCL 100 MG TAB: 100 | 30 days supply | Qty: 30 | Fill #0

## 2015-06-10 DIAGNOSIS — F411 Generalized anxiety disorder: Secondary | ICD-10-CM | POA: Diagnosis not present

## 2015-06-10 DIAGNOSIS — F329 Major depressive disorder, single episode, unspecified: Secondary | ICD-10-CM | POA: Diagnosis not present

## 2015-06-10 DIAGNOSIS — F4310 Post-traumatic stress disorder, unspecified: Secondary | ICD-10-CM | POA: Diagnosis not present

## 2015-06-15 DIAGNOSIS — F4310 Post-traumatic stress disorder, unspecified: Secondary | ICD-10-CM | POA: Diagnosis not present

## 2015-06-15 DIAGNOSIS — F321 Major depressive disorder, single episode, moderate: Secondary | ICD-10-CM | POA: Diagnosis not present

## 2015-06-15 DIAGNOSIS — F411 Generalized anxiety disorder: Secondary | ICD-10-CM | POA: Diagnosis not present

## 2015-07-12 DIAGNOSIS — F4310 Post-traumatic stress disorder, unspecified: Secondary | ICD-10-CM | POA: Diagnosis not present

## 2015-07-12 DIAGNOSIS — F411 Generalized anxiety disorder: Secondary | ICD-10-CM | POA: Diagnosis not present

## 2015-07-12 DIAGNOSIS — F4010 Social phobia, unspecified: Secondary | ICD-10-CM | POA: Diagnosis not present

## 2015-07-12 DIAGNOSIS — F321 Major depressive disorder, single episode, moderate: Secondary | ICD-10-CM | POA: Diagnosis not present

## 2015-07-29 DIAGNOSIS — F411 Generalized anxiety disorder: Secondary | ICD-10-CM | POA: Diagnosis not present

## 2015-07-29 DIAGNOSIS — F4310 Post-traumatic stress disorder, unspecified: Secondary | ICD-10-CM | POA: Diagnosis not present

## 2015-07-29 DIAGNOSIS — F321 Major depressive disorder, single episode, moderate: Secondary | ICD-10-CM | POA: Diagnosis not present

## 2015-07-29 DIAGNOSIS — F4010 Social phobia, unspecified: Secondary | ICD-10-CM | POA: Diagnosis not present

## 2015-08-12 ENCOUNTER — Ambulatory Visit: Payer: 59 | Admitting: Family

## 2015-08-12 ENCOUNTER — Ambulatory Visit (HOSPITAL_COMMUNITY)
Admission: EM | Admit: 2015-08-12 | Discharge: 2015-08-12 | Disposition: A | Discharge status: Home / Self Care | Payer: 59 | Attending: Family Medicine | Admitting: Family Medicine

## 2015-08-12 ENCOUNTER — Encounter (HOSPITAL_COMMUNITY): Payer: Self-pay | Admitting: Emergency Medicine

## 2015-08-12 DIAGNOSIS — T733XXA Exhaustion due to excessive exertion, initial encounter: Secondary | ICD-10-CM

## 2015-08-12 LAB — POCT I-STAT, CHEM 8
BUN: 10 mg/dL (ref 6–20)
CHLORIDE: 101 mmol/L (ref 101–111)
CREATININE: 0.6 mg/dL (ref 0.44–1.00)
Calcium, Ion: 1.16 mmol/L (ref 1.12–1.23)
GLUCOSE: 96 mg/dL (ref 65–99)
HCT: 43 % (ref 36.0–46.0)
Hemoglobin: 14.6 g/dL (ref 12.0–15.0)
POTASSIUM: 3.9 mmol/L (ref 3.5–5.1)
Sodium: 138 mmol/L (ref 135–145)
TCO2: 24 mmol/L (ref 0–100)

## 2015-08-12 NOTE — ED Provider Notes (Signed)
CSN: [id number]     Arrival date & time 08/12/15  1256 History   None    Chief Complaint  Patient presents with  . Fatigue   (Consider location/radiation/quality/duration/timing/severity/associated sxs/prior Treatment) Patient is a 40 y.o. female presenting with weakness. The history is provided by the patient.  Weakness This is a new problem. The current episode started more than 1 week ago (sx for couple mos, increased recently.). Associated symptoms include headaches. Associated symptoms comments: Similar to h/o anemia..    Past Medical History  Diagnosis Date  . PONV (postoperative nausea and vomiting)   . Headache(784.0)   . GERD (gastroesophageal reflux disease)     tums prn-with pregnancy only  . Placenta previa 2012    with current pregnancy-type and cross 2 Units per protocol  . Anemia   . Depression   . Migraines   . IBS (irritable bowel syndrome)   . Chicken pox   . Syringomyelia, acquired ([place])   . Shortness of breath dyspnea     with this pregnancy   . Anxiety    Past Surgical History  Procedure Laterality Date  . Arthroscopic knee  1990  . Left breast reduction  1996  . Appendectomy  2005  . Cesarean section  12/27/2010    Procedure: CESAREAN SECTION;  Surgeon: Shon Millet II;  Location: [facility];  Service: Gynecology;  Laterality: N/A;  . Wisdom tooth extraction    . Cesarean section N/A 02/03/2015    Procedure: CESAREAN SECTION;  Surgeon: Everlene Farrier, MD;  Location: [facility];  Service: Obstetrics;  Laterality: N/A;  . Tubal ligation Bilateral 02/03/2015    Procedure: BILATERAL TUBAL LIGATION;  Surgeon: Everlene Farrier, MD;  Location: [facility];  Service: Obstetrics;  Laterality: Bilateral;   Family History  Problem Relation Age of Onset  . High blood pressure Mother   . Arthritis Mother   . Hypertension Mother   . High blood pressure Brother   . Alcohol abuse Father   . Arthritis Father    Social History  Substance Use Topics  . Smoking status: Never  Smoker   . Smokeless tobacco: Never Used  . Alcohol Use: No     Comment: rarely, but not while preg   OB History    Gravida Para Term Preterm AB TAB SAB Ectopic Multiple Living   3 3 2 1      0 3     Review of Systems  Constitutional: Positive for activity change. Negative for fever and chills.  Neurological: Positive for weakness and headaches.  All other systems reviewed and are negative.   Allergies  Topamax  Home Medications   Prior to Admission medications   Medication Sig Start Date End Date Taking? Authorizing Provider  ferrous sulfate 325 (65 FE) MG tablet Take 325 mg by mouth daily with breakfast.    Historical Provider, MD  ibuprofen (ADVIL,MOTRIN) 600 MG tablet Take 1 tablet (600 mg total) by mouth every 6 (six) hours as needed. 02/06/15   Molli Posey, MD  oxyCODONE-acetaminophen (PERCOCET/ROXICET) 5-325 MG tablet Take 1 tablet by mouth every 4 (four) hours as needed (for pain scale 4-7). 02/06/15   Molli Posey, MD  Prenatal Vit-Fe Fumarate-FA (PRENATAL MULTIVITAMIN) TABS tablet Take 1 tablet by mouth at bedtime.    Historical Provider, MD   Meds Ordered and Administered this Visit  Medications - No data to display  BP 109/70 mmHg  Pulse 91  Temp(Src) 97.7 F (36.5 C) (Oral)  Resp 18  SpO2  99%  LMP 07/27/2015 No data found.   Physical Exam  Constitutional: She is oriented to person, place, and time. She appears well-developed and well-nourished. No distress.  HENT:  Right Ear: External ear normal.  Mouth/Throat: Oropharynx is clear and moist.  Eyes: Conjunctivae and EOM are normal. Pupils are equal, round, and reactive to light.  Neck: Normal range of motion. Neck supple.  Cardiovascular: Normal rate, regular rhythm, normal heart sounds and intact distal pulses.   Pulmonary/Chest: Effort normal and breath sounds normal.  Abdominal: Soft. Bowel sounds are normal. She exhibits no mass. There is no tenderness.  Musculoskeletal: Normal range of motion.   Lymphadenopathy:    She has no cervical adenopathy.  Neurological: She is alert and oriented to person, place, and time.  Skin: Skin is warm and dry.  Nursing note and vitals reviewed.   ED Course  Procedures (including critical care time)  Labs Review Labs Reviewed  POCT I-STAT, CHEM 8    Imaging Review No results found.   Visual Acuity Review  Right Eye Distance:   Left Eye Distance:   Bilateral Distance:    Right Eye Near:   Left Eye Near:    Bilateral Near:         MDM   1. Fatigue due to excessive exertion, initial encounter        Billy Fischer, MD 08/12/15 1337

## 2015-08-12 NOTE — Discharge Instructions (Signed)
See your doctor or gynec for further eval.

## 2015-08-12 NOTE — ED Notes (Signed)
Patient complains of fatigue. Shortness of breath, pale, feeling sweaty, headache, cold chills

## 2015-09-14 DIAGNOSIS — L400 Psoriasis vulgaris: Secondary | ICD-10-CM | POA: Diagnosis not present

## 2015-09-14 DIAGNOSIS — Z79899 Other long term (current) drug therapy: Secondary | ICD-10-CM | POA: Diagnosis not present

## 2015-09-14 MED FILL — metroNIDAZOLE 0.75 % CREA: 0.75 | 15 days supply | Qty: 45 | Fill #0

## 2015-09-15 MED FILL — METHOTREXATE 2.5 MG TABLET: 2.5 | 28 days supply | Qty: 24 | Fill #0

## 2015-09-15 MED FILL — FOLIC ACID 1 MG TABLET: 1 | 30 days supply | Qty: 30 | Fill #0

## 2015-10-27 DIAGNOSIS — Z79899 Other long term (current) drug therapy: Secondary | ICD-10-CM | POA: Diagnosis not present

## 2015-10-28 MED FILL — METHOTREXATE 2.5 MG TABLET: 2.5 | 28 days supply | Qty: 24 | Fill #0

## 2015-11-09 MED FILL — FOLIC ACID 1 MG TABLET: 1 | 30 days supply | Qty: 30 | Fill #1

## 2015-12-02 MED FILL — METHOTREXATE 2.5 MG TABLET: 2.5 | 28 days supply | Qty: 24 | Fill #1

## 2016-01-08 ENCOUNTER — Emergency Department
Admission: EM | Admit: 2016-01-08 | Discharge: 2016-01-08 | Disposition: A | Discharge status: Home / Self Care | Payer: 59 | Source: Home / Self Care | Attending: Family Medicine | Admitting: Family Medicine

## 2016-01-08 ENCOUNTER — Encounter: Payer: Self-pay | Admitting: Emergency Medicine

## 2016-01-08 DIAGNOSIS — J209 Acute bronchitis, unspecified: Secondary | ICD-10-CM | POA: Diagnosis not present

## 2016-01-08 MED ORDER — ALBUTEROL SULFATE HFA 108 (90 BASE) MCG/ACT IN AERS: 1.0000 | INHALATION_SPRAY | Freq: Four times a day (QID) | RESPIRATORY_TRACT | 0 refills | Status: DC | PRN

## 2016-01-08 MED ORDER — PREDNISONE 20 MG PO TABS: ORAL_TABLET | ORAL | 0 refills | Status: DC

## 2016-01-08 MED ORDER — GUAIFENESIN-CODEINE 100-10 MG/5ML PO SOLN: 10.0000 mL | Freq: Three times a day (TID) | ORAL | 0 refills | Status: DC | PRN

## 2016-01-08 MED ORDER — BENZONATATE 100 MG PO CAPS: 100.0000 mg | ORAL_CAPSULE | Freq: Three times a day (TID) | ORAL | 0 refills | Status: DC

## 2016-01-08 MED ORDER — AZITHROMYCIN 250 MG PO TABS: 250.0000 mg | ORAL_TABLET | Freq: Every day | ORAL | 0 refills | Status: DC

## 2016-01-08 NOTE — ED Triage Notes (Signed)
Pt c/o productive cough x1 week. States she has coughed so much she now has right sided rib pain. States fever yesterday of 100.4.

## 2016-01-08 NOTE — Discharge Instructions (Signed)
°  Guaifenesin-codeine is a strong cough medication.  This medication dose cause drowsiness. Do not drink alcohol, drive, or operate heavy machinery while taking. Do not take more than prescribed. Do not share this medication with other people.   Please take antibiotics as prescribed and be sure to complete entire course even if you start to feel better to ensure infection does not come back.

## 2016-01-08 NOTE — ED Provider Notes (Signed)
CSN: [id number]     Arrival date & time 01/08/16  1651 History   First MD Initiated Contact with Patient 01/08/16 1709     Chief Complaint  Patient presents with  . Cough   (Consider location/radiation/quality/duration/timing/severity/associated sxs/prior Treatment) HPI  [name] is a 40 y.o. female presenting to UC with c/o moderately productive cough that started about 1 week ago.  She notes she has coughed so much she is now developing Right sided rib pain.  Associated nasal congestion and sore throat. Fever of 100.4*F last night.  She has tried OTC cough/cold medication with mild relief. She notes her "whole family" is sick as well. Denies recent travel.    Past Medical History:  Diagnosis Date  . Anemia   . Anxiety   . Chicken pox   . Depression   . GERD (gastroesophageal reflux disease)    tums prn-with pregnancy only  . Headache(784.0)   . IBS (irritable bowel syndrome)   . Migraines   . Placenta previa 2012   with current pregnancy-type and cross 2 Units per protocol  . PONV (postoperative nausea and vomiting)   . Shortness of breath dyspnea    with this pregnancy   . Syringomyelia, acquired [facility])    Past Surgical History:  Procedure Laterality Date  . APPENDECTOMY  2005  . arthroscopic knee  1990  . CESAREAN SECTION  12/27/2010   Procedure: CESAREAN SECTION;  Surgeon: Shon Millet II;  Location: [facility];  Service: Gynecology;  Laterality: N/A;  . CESAREAN SECTION N/A 02/03/2015   Procedure: CESAREAN SECTION;  Surgeon: Everlene Farrier, MD;  Location: [facility];  Service: Obstetrics;  Laterality: N/A;  . left breast reduction  1996  . TUBAL LIGATION Bilateral 02/03/2015   Procedure: BILATERAL TUBAL LIGATION;  Surgeon: Everlene Farrier, MD;  Location: [facility];  Service: Obstetrics;  Laterality: Bilateral;  . WISDOM TOOTH EXTRACTION     Family History  Problem Relation Age of Onset  . High blood pressure Mother   . Arthritis Mother   . Hypertension Mother   .  Alcohol abuse Father   . Arthritis Father   . High blood pressure Brother    Social History  Substance Use Topics  . Smoking status: Never Smoker  . Smokeless tobacco: Never Used  . Alcohol use No     Comment: rarely, but not while preg   OB History    Gravida Para Term Preterm AB Living   3 3 2 1   3    SAB TAB Ectopic Multiple Live Births         0 3     Review of Systems  Constitutional: Positive for fever. Negative for chills.  HENT: Positive for congestion, rhinorrhea and sore throat. Negative for ear pain, trouble swallowing and voice change.   Respiratory: Positive for cough. Negative for shortness of breath.   Cardiovascular: Positive for chest pain (Right side rib pain). Negative for palpitations.  Gastrointestinal: Negative for abdominal pain, diarrhea, nausea and vomiting.  Musculoskeletal: Negative for arthralgias, back pain and myalgias.  Skin: Negative for rash.  Neurological: Positive for headaches. Negative for dizziness and light-headedness.    Allergies  Topamax [topiramate]  Home Medications   Prior to Admission medications   Medication Sig Start Date End Date Taking? Authorizing Provider  albuterol (PROVENTIL HFA;VENTOLIN HFA) 108 (90 Base) MCG/ACT inhaler Inhale 1-2 puffs into the lungs every 6 (six) hours as needed for wheezing or shortness of breath. 01/08/16   Junie Panning  Hilda Blades, PA-C  azithromycin (ZITHROMAX) 250 MG tablet Take 1 tablet (250 mg total) by mouth daily. Take first 2 tablets together, then 1 every day until finished. 01/08/16   Noland Fordyce, PA-C  benzonatate (TESSALON) 100 MG capsule Take 1-2 capsules (100-200 mg total) by mouth every 8 (eight) hours. 01/08/16   Noland Fordyce, PA-C  ferrous sulfate 325 (65 FE) MG tablet Take 325 mg by mouth daily with breakfast.    Historical Provider, MD  guaiFENesin-codeine 100-10 MG/5ML syrup Take 10 mLs by mouth 3 (three) times daily as needed for cough. 01/08/16   Noland Fordyce, PA-C  ibuprofen  (ADVIL,MOTRIN) 600 MG tablet Take 1 tablet (600 mg total) by mouth every 6 (six) hours as needed. 02/06/15   Molli Posey, MD  oxyCODONE-acetaminophen (PERCOCET/ROXICET) 5-325 MG tablet Take 1 tablet by mouth every 4 (four) hours as needed (for pain scale 4-7). 02/06/15   Molli Posey, MD  predniSONE (DELTASONE) 20 MG tablet 3 tabs po day one, then 2 po daily x 4 days 01/08/16   Noland Fordyce, PA-C  Prenatal Vit-Fe Fumarate-FA (PRENATAL MULTIVITAMIN) TABS tablet Take 1 tablet by mouth at bedtime.    Historical Provider, MD   Meds Ordered and Administered this Visit  Medications - No data to display  BP 132/76 (BP Location: Right Arm)   Pulse 86   Temp 98 F (36.7 C) (Oral)   Wt 213 lb (96.6 kg)   LMP 12/15/2015 (Approximate)   SpO2 96%   Breastfeeding? Yes   BMI 32.39 kg/m  No data found.   Physical Exam  Constitutional: She appears well-developed and well-nourished. No distress.  HENT:  Head: Normocephalic and atraumatic.  Right Ear: Tympanic membrane normal.  Left Ear: Tympanic membrane normal.  Nose: Nose normal.  Mouth/Throat: Uvula is midline, oropharynx is clear and moist and mucous membranes are normal.  Eyes: Conjunctivae are normal. No scleral icterus.  Neck: Normal range of motion. Neck supple.  Cardiovascular: Normal rate, regular rhythm and normal heart sounds.   Pulmonary/Chest: Effort normal and breath sounds normal. No respiratory distress. She has no wheezes. She has no rales. She exhibits no tenderness.  Intermittent mildly productive cough on exam. No respiratory distress.  Abdominal: Soft. She exhibits no distension. There is no tenderness.  Musculoskeletal: Normal range of motion.  Neurological: She is alert.  Skin: Skin is warm and dry. She is not diaphoretic.  Nursing note and vitals reviewed.   Urgent Care Course   Clinical Course     Procedures (including critical care time)  Labs Review Labs Reviewed - No data to display  Imaging  Review No results found.    MDM   1. Acute bronchitis, unspecified organism    Gradually worsening URI symptoms for 1 week, causing Right side chest wall pain with cough. Sick contacts at home. No respiratory distress. O2 Sat 96% on RA. Pt has had inhaler and prednisone in the past when she gets similar cough, does well. Rx: Azithromycin, Prednisone, Albuterol, Tessalon.  Pt requested cough syrup. Will prescribe guaifenesin-codeine.  Advised not to drink alcohol or drive while taking.  F/u with PCP in 1 week if not improving. Patient verbalized understanding and agreement with treatment plan.     Noland Fordyce, PA-C 01/08/16 1726

## 2016-01-17 ENCOUNTER — Emergency Department
Admission: EM | Admit: 2016-01-17 | Discharge: 2016-01-17 | Disposition: A | Discharge status: Home / Self Care | Payer: 59 | Source: Home / Self Care | Attending: Family Medicine | Admitting: Family Medicine

## 2016-01-17 ENCOUNTER — Emergency Department (INDEPENDENT_AMBULATORY_CARE_PROVIDER_SITE_OTHER): Payer: 59

## 2016-01-17 ENCOUNTER — Encounter: Payer: Self-pay | Admitting: *Deleted

## 2016-01-17 DIAGNOSIS — R0602 Shortness of breath: Secondary | ICD-10-CM | POA: Diagnosis not present

## 2016-01-17 DIAGNOSIS — R079 Chest pain, unspecified: Secondary | ICD-10-CM | POA: Diagnosis not present

## 2016-01-17 DIAGNOSIS — R053 Chronic cough: Secondary | ICD-10-CM

## 2016-01-17 DIAGNOSIS — R05 Cough: Secondary | ICD-10-CM

## 2016-01-17 DIAGNOSIS — J9801 Acute bronchospasm: Secondary | ICD-10-CM

## 2016-01-17 MED ORDER — METHYLPREDNISOLONE SODIUM SUCC 40 MG IJ SOLR
80.0000 mg | Freq: Once | INTRAMUSCULAR | Status: AC
  Administered 2016-01-17: 80 mg via INTRAMUSCULAR

## 2016-01-17 MED ORDER — PREDNISONE 20 MG PO TABS: ORAL_TABLET | ORAL | 0 refills | Status: DC

## 2016-01-17 NOTE — Discharge Instructions (Signed)
You were given a shot of solumedrol (a steroid) today to help with inflammation in your airway to help with cough and shortness of breath.  You have been prescribed prednisone, an oral steroid.  You may start this medication tomorrow with breakfast.

## 2016-01-17 NOTE — ED Provider Notes (Signed)
CSN: [id number]     Arrival date & time 01/17/16  1820 History   First MD Initiated Contact with Patient 01/17/16 1843     Chief Complaint  Patient presents with  . Cough  . URI   (Consider location/radiation/quality/duration/timing/severity/associated sxs/prior Treatment) HPI [name] is a 40 y.o. female presenting to UC with c/o continued cough despite being treated with prednisone, azithromycin and albuterol inhaler 9 days ago for acute bronchitis.  Pt notes she feels a little better, less congestion, no fever, nausea or vomiting but she has chest soreness from a persistent hacking cough.  She has used her inhaler with minimal relief.  Occasionally short of breath.    Past Medical History:  Diagnosis Date  . Anemia   . Anxiety   . Chicken pox   . Depression   . GERD (gastroesophageal reflux disease)    tums prn-with pregnancy only  . Headache(784.0)   . IBS (irritable bowel syndrome)   . Migraines   . Placenta previa 2012   with current pregnancy-type and cross 2 Units per protocol  . PONV (postoperative nausea and vomiting)   . Shortness of breath dyspnea    with this pregnancy   . Syringomyelia, acquired [facility])    Past Surgical History:  Procedure Laterality Date  . APPENDECTOMY  2005  . arthroscopic knee  1990  . CESAREAN SECTION  12/27/2010   Procedure: CESAREAN SECTION;  Surgeon: Shon Millet II;  Location: [facility];  Service: Gynecology;  Laterality: N/A;  . CESAREAN SECTION N/A 02/03/2015   Procedure: CESAREAN SECTION;  Surgeon: Everlene Farrier, MD;  Location: [facility];  Service: Obstetrics;  Laterality: N/A;  . left breast reduction  1996  . TUBAL LIGATION Bilateral 02/03/2015   Procedure: BILATERAL TUBAL LIGATION;  Surgeon: Everlene Farrier, MD;  Location: [facility];  Service: Obstetrics;  Laterality: Bilateral;  . WISDOM TOOTH EXTRACTION     Family History  Problem Relation Age of Onset  . High blood pressure Mother   . Arthritis Mother   . Hypertension  Mother   . Alcohol abuse Father   . Arthritis Father   . High blood pressure Brother    Social History  Substance Use Topics  . Smoking status: Never Smoker  . Smokeless tobacco: Never Used  . Alcohol use No     Comment: rarely, but not while preg   OB History    Gravida Para Term Preterm AB Living   3 3 2 1   3    SAB TAB Ectopic Multiple Live Births         0 3     Review of Systems  Constitutional: Negative for chills, fatigue and fever.  HENT: Positive for sore throat (mild). Negative for congestion, ear pain, rhinorrhea, sinus pain and voice change.   Respiratory: Positive for cough, chest tightness and shortness of breath. Negative for wheezing.   Cardiovascular: Positive for chest pain (from cough).  Gastrointestinal: Negative for abdominal pain, diarrhea, nausea and vomiting.  Musculoskeletal: Negative for arthralgias and myalgias.    Allergies  Topamax [topiramate]  Home Medications   Prior to Admission medications   Medication Sig Start Date End Date Taking? Authorizing Provider  albuterol (PROVENTIL HFA;VENTOLIN HFA) 108 (90 Base) MCG/ACT inhaler Inhale 1-2 puffs into the lungs every 6 (six) hours as needed for wheezing or shortness of breath. 01/08/16   Noland Fordyce, PA-C  benzonatate (TESSALON) 100 MG capsule Take 1-2 capsules (100-200 mg total) by mouth every 8 (eight)  hours. 01/08/16   Noland Fordyce, PA-C  ferrous sulfate 325 (65 FE) MG tablet Take 325 mg by mouth daily with breakfast.    Historical Provider, MD  guaiFENesin-codeine 100-10 MG/5ML syrup Take 10 mLs by mouth 3 (three) times daily as needed for cough. 01/08/16   Noland Fordyce, PA-C  ibuprofen (ADVIL,MOTRIN) 600 MG tablet Take 1 tablet (600 mg total) by mouth every 6 (six) hours as needed. 02/06/15   Molli Posey, MD  predniSONE (DELTASONE) 20 MG tablet 3 tabs po daily x 3 days, then 2 tabs x 3 days, then 1.5 tabs x 3 days, then 1 tab x 3 days, then 0.5 tabs x 3 days 01/17/16   Noland Fordyce,  PA-C  Prenatal Vit-Fe Fumarate-FA (PRENATAL MULTIVITAMIN) TABS tablet Take 1 tablet by mouth at bedtime.    Historical Provider, MD   Meds Ordered and Administered this Visit   Medications  methylPREDNISolone sodium succinate (SOLU-MEDROL) 40 mg/mL injection 80 mg (80 mg Intramuscular Given 01/17/16 1939)    BP 120/79 (BP Location: Left Arm)   Pulse 90   Temp 97.6 F (36.4 C) (Oral)   Resp 16   Wt 214 lb (97.1 kg)   LMP 01/09/2016   SpO2 100%   BMI 32.54 kg/m  No data found.   Physical Exam  Constitutional: She appears well-developed and well-nourished. No distress.  HENT:  Head: Normocephalic and atraumatic.  Right Ear: Tympanic membrane normal.  Left Ear: Tympanic membrane normal.  Nose: Nose normal.  Mouth/Throat: Uvula is midline, oropharynx is clear and moist and mucous membranes are normal.  Eyes: Conjunctivae are normal. No scleral icterus.  Neck: Normal range of motion.  Cardiovascular: Normal rate, regular rhythm and normal heart sounds.   Pulmonary/Chest: Effort normal and breath sounds normal. No respiratory distress. She has no wheezes. She has no rales.  Lungs: CTAB. No evidence of respiratory distress.  Abdominal: Soft. She exhibits no distension. There is no tenderness.  Musculoskeletal: Normal range of motion.  Neurological: She is alert.  Skin: Skin is warm and dry. She is not diaphoretic.  Nursing note and vitals reviewed.   Urgent Care Course   Clinical Course     Procedures (including critical care time)  Labs Review Labs Reviewed - No data to display  Imaging Review Dg Chest 2 View  Result Date: 01/17/2016 CLINICAL DATA:  Initial evaluation for 3 week history of persisting cough, shortness of breath, right-sided chest pain. EXAM: CHEST  2 VIEW COMPARISON:  Prior radiograph from 11/19/2013. FINDINGS: The cardiac and mediastinal silhouettes are stable in size and contour, and remain within normal limits. The lungs are normally inflated. Mild  diffuse peribronchial thickening, slightly more prominent as compared to most recent radiograph from 2015. No airspace consolidation, pleural effusion, or pulmonary edema is identified. There is no pneumothorax. No acute osseous abnormality identified. IMPRESSION: 1. Mild diffuse peribronchial thickening, which may reflect sequela of acute bronchiolitis given the provided history of cough. No consolidative opacity to suggest pneumonia. 2. No other active cardiopulmonary disease. Electronically Signed   By: Jeannine Boga M.D.   On: 01/17/2016 19:18    MDM   1. Persistent cough   2. Acute bronchospasm    Pt c/o continued cough despite completing a course of prednisone and azithromycin.  Pt is afebrile. Lungs: CTAB. No wheeze. O2 Sat 100% on RA.  CXR: c/w mild diffuse peribronchial thickening, sequela of acute bronchiolitis. No evidence of pneumonia.  Tx in UC: Solumedrol 80mg  IM Rx: Prednisone 2 week  taper. Encouraged f/u with PCP in 1 week if not improving, sooner if worsening- fever, worsening SOB, or new symptoms.     Noland Fordyce, PA-C 01/17/16 1942

## 2016-01-17 NOTE — ED Triage Notes (Signed)
Patient was seen on 01/08/16, treated for URI with tessalon, prednisone and zpack. Finished all medications. No improvement. Afebrile. C/o right sided CP with coughing, breathing. SOB with exertion.

## 2016-01-21 LAB — HM PAP SMEAR: HM Pap smear: NORMAL

## 2016-04-06 DIAGNOSIS — Z6833 Body mass index (BMI) 33.0-33.9, adult: Secondary | ICD-10-CM | POA: Diagnosis not present

## 2016-04-06 DIAGNOSIS — Z01419 Encounter for gynecological examination (general) (routine) without abnormal findings: Secondary | ICD-10-CM | POA: Diagnosis not present

## 2016-04-06 DIAGNOSIS — R5383 Other fatigue: Secondary | ICD-10-CM | POA: Diagnosis not present

## 2016-04-06 MED FILL — SERTRALINE HCL 100 MG TAB: 100 | 30 days supply | Qty: 30 | Fill #0

## 2016-04-10 MED FILL — VIT D3-50 50,000 UNITS CAPS: 1.25 MG | 56 days supply | Qty: 8 | Fill #0

## 2016-04-24 DIAGNOSIS — N924 Excessive bleeding in the premenopausal period: Secondary | ICD-10-CM | POA: Diagnosis not present

## 2016-04-24 DIAGNOSIS — N8501 Benign endometrial hyperplasia: Secondary | ICD-10-CM | POA: Diagnosis not present

## 2016-04-24 DIAGNOSIS — N946 Dysmenorrhea, unspecified: Secondary | ICD-10-CM | POA: Diagnosis not present

## 2016-06-07 ENCOUNTER — Telehealth: Payer: 59 | Admitting: Nurse Practitioner

## 2016-06-07 DIAGNOSIS — R059 Cough, unspecified: Secondary | ICD-10-CM

## 2016-06-07 DIAGNOSIS — J029 Acute pharyngitis, unspecified: Secondary | ICD-10-CM

## 2016-06-07 DIAGNOSIS — R05 Cough: Secondary | ICD-10-CM | POA: Diagnosis not present

## 2016-06-07 MED ORDER — AZITHROMYCIN 250 MG PO TABS: ORAL_TABLET | ORAL | 0 refills | Status: DC

## 2016-06-07 NOTE — Progress Notes (Signed)

## 2016-06-08 DIAGNOSIS — N920 Excessive and frequent menstruation with regular cycle: Secondary | ICD-10-CM | POA: Diagnosis not present

## 2016-06-08 DIAGNOSIS — Z3202 Encounter for pregnancy test, result negative: Secondary | ICD-10-CM | POA: Diagnosis not present

## 2016-06-20 DIAGNOSIS — H5213 Myopia, bilateral: Secondary | ICD-10-CM | POA: Diagnosis not present

## 2016-09-21 MED FILL — SERTRALINE HCL 100 MG TAB: 100 | 30 days supply | Qty: 30 | Fill #1

## 2016-10-31 MED FILL — SERTRALINE HCL 100 MG TAB: 100 | 30 days supply | Qty: 30 | Fill #2

## 2016-11-17 ENCOUNTER — Emergency Department
Admission: EM | Admit: 2016-11-17 | Discharge: 2016-11-17 | Disposition: A | Discharge status: Home / Self Care | Payer: 59 | Source: Home / Self Care | Attending: Family Medicine | Admitting: Family Medicine

## 2016-11-17 ENCOUNTER — Emergency Department (INDEPENDENT_AMBULATORY_CARE_PROVIDER_SITE_OTHER): Payer: 59

## 2016-11-17 ENCOUNTER — Encounter: Payer: Self-pay | Admitting: Emergency Medicine

## 2016-11-17 DIAGNOSIS — J069 Acute upper respiratory infection, unspecified: Secondary | ICD-10-CM

## 2016-11-17 DIAGNOSIS — R05 Cough: Secondary | ICD-10-CM | POA: Diagnosis not present

## 2016-11-17 DIAGNOSIS — B9789 Other viral agents as the cause of diseases classified elsewhere: Secondary | ICD-10-CM

## 2016-11-17 DIAGNOSIS — R059 Cough, unspecified: Secondary | ICD-10-CM

## 2016-11-17 MED ORDER — GUAIFENESIN-CODEINE 100-10 MG/5ML PO SOLN: ORAL | 0 refills | Status: DC

## 2016-11-17 MED ORDER — PREDNISONE 20 MG PO TABS: ORAL_TABLET | ORAL | 0 refills | Status: DC

## 2016-11-17 MED ORDER — METHYLPREDNISOLONE SODIUM SUCC 125 MG IJ SOLR
80.0000 mg | Freq: Once | INTRAMUSCULAR | Status: AC
  Administered 2016-11-17: 80 mg via INTRAMUSCULAR

## 2016-11-17 MED ORDER — DOXYCYCLINE HYCLATE 100 MG PO CAPS: 100.0000 mg | ORAL_CAPSULE | Freq: Two times a day (BID) | ORAL | 0 refills | Status: DC

## 2016-11-17 NOTE — Discharge Instructions (Signed)
Begin prednisone Saturday 11/18/16. Take plain guaifenesin (1200mg  extended release tabs such as Mucinex) twice daily, with plenty of water, for cough and congestion.  Get adequate rest.    Also recommend using saline nasal spray several times daily and saline nasal irrigation (AYR is a common brand).   Try warm salt water gargles for sore throat.  Stop all antihistamines for now, and other non-prescription cough/cold preparations.  Follow-up with family doctor if not improving about10 days.

## 2016-11-17 NOTE — ED Provider Notes (Signed)
[facility]    CSN: [id number] Arrival date & time: 11/17/16  1901     History   Chief Complaint Chief Complaint  Patient presents with  . Cough    HPI [name] is a 41 y.o. female.   Patient developed a non-productive cough and mild sinus congestion about 4 weeks ago but did not feel ill.  The cough persisted and two weeks ago she began to feel worse with increasing cough, fatigue, and sweats.  Her cough is worse at night and she feels tight in her right anterior chest.  She has developed occasional wheezing and shortness of breath with activity.  She frequently coughs until she gags.  She notes that her colds tend to linger.  No past history of asthma, and no family history of asthma.   The history is provided by the patient.    Past Medical History:  Diagnosis Date  . Anemia   . Anxiety   . Chicken pox   . Depression   . GERD (gastroesophageal reflux disease)    tums prn-with pregnancy only  . Headache(784.0)   . IBS (irritable bowel syndrome)   . Migraines   . Placenta previa 2012   with current pregnancy-type and cross 2 Units per protocol  . PONV (postoperative nausea and vomiting)   . Shortness of breath dyspnea    with this pregnancy   . Syringomyelia, acquired [facility])     Patient Active Problem List   Diagnosis Date Noted  . S/P cesarean section 02/03/2015  . Low back pain 04/24/2014  . Chest pain 01/01/2014  . Scalp lesion 01/01/2014  . Wheezing 11/19/2013  . Allergic rhinitis 11/11/2013  . Sinusitis, acute maxillary 11/11/2013  . Daytime somnolence 09/04/2013  . Abdominal pain, other specified site 08/21/2013  . Syringomyelia ([place]) 05/09/2013  . Mass 05/09/2013  . Paresthesia 07/19/2012  . Depression 07/19/2012  . Sensory disturbance 06/27/2012  . Leg weakness 06/27/2012  . Leukocytosis 06/27/2012  . Microcytic anemia 06/27/2012    Past Surgical History:  Procedure Laterality Date  . APPENDECTOMY  2005  . arthroscopic  knee  1990  . CESAREAN SECTION  12/27/2010   Procedure: CESAREAN SECTION;  Surgeon: Shon Millet II;  Location: [facility];  Service: Gynecology;  Laterality: N/A;  . CESAREAN SECTION N/A 02/03/2015   Procedure: CESAREAN SECTION;  Surgeon: Everlene Farrier, MD;  Location: [facility];  Service: Obstetrics;  Laterality: N/A;  . left breast reduction  1996  . TUBAL LIGATION Bilateral 02/03/2015   Procedure: BILATERAL TUBAL LIGATION;  Surgeon: Everlene Farrier, MD;  Location: [facility];  Service: Obstetrics;  Laterality: Bilateral;  . WISDOM TOOTH EXTRACTION      OB History    Gravida Para Term Preterm AB Living   3 3 2 1   3    SAB TAB Ectopic Multiple Live Births         0 3       Home Medications    Prior to Admission medications   Medication Sig Start Date End Date Taking? Authorizing Provider  sertraline (ZOLOFT) 50 MG tablet Take 50 mg by mouth daily.   Yes [provider]  doxycycline (VIBRAMYCIN) 100 MG capsule Take 1 capsule (100 mg total) by mouth 2 (two) times daily. Take with food. 11/17/16   Kandra Nicolas, MD  ferrous sulfate 325 (65 FE) MG tablet Take 325 mg by mouth daily with breakfast.    [provider]  guaiFENesin-codeine 100-10 MG/5ML syrup  Take 22mL by mouth Q12hr PRN cough 11/17/16   Kandra Nicolas, MD  ibuprofen (ADVIL,MOTRIN) 600 MG tablet Take 1 tablet (600 mg total) by mouth every 6 (six) hours as needed. 02/06/15   Molli Posey, MD  predniSONE (DELTASONE) 20 MG tablet Take one tab by mouth twice daily for 5 days, then one daily. Take with food. 11/17/16   Kandra Nicolas, MD  Prenatal Vit-Fe Fumarate-FA (PRENATAL MULTIVITAMIN) TABS tablet Take 1 tablet by mouth at bedtime.    [provider]    Family History Family History  Problem Relation Age of Onset  . High blood pressure Mother   . Arthritis Mother   . Hypertension Mother   . Alcohol abuse Father   . Arthritis Father   . High blood pressure Brother     Social History Social  History  Substance Use Topics  . Smoking status: Never Smoker  . Smokeless tobacco: Never Used  . Alcohol use No     Comment: rarely, but not while preg     Allergies   Topamax [topiramate]   Review of Systems Review of Systems No sore throat + cough No pleuritic pain, but feels tightness in right anterior chest.  + wheezing + nasal congestion + post-nasal drainage No sinus pain/pressure No itchy/red eyes No earache No hemoptysis + SOB No fever, + chills/sweats No nausea No vomiting No abdominal pain No diarrhea No urinary symptoms No skin rash + fatigue No myalgias No headache Used OTC meds without relief   Physical Exam Triage Vital Signs ED Triage Vitals  Enc Vitals Group     BP 11/17/16 1923 131/83     Pulse Rate 11/17/16 1923 84     Resp --      Temp 11/17/16 1923 98 F (36.7 C)     Temp Source 11/17/16 1923 Oral     SpO2 11/17/16 1923 99 %     Weight 11/17/16 1924 218 lb (98.9 kg)     Height 11/17/16 1924 5\' 8"  (1.727 m)     Head Circumference --      Peak Flow --      Pain Score 11/17/16 1924 4     Pain Loc --      Pain Edu? --      Excl. in [place]? --    No data found.   Updated Vital Signs BP 131/83 (BP Location: Left Arm)   Pulse 84   Temp 98 F (36.7 C) (Oral)   Ht 5\' 8"  (1.727 m)   Wt 218 lb (98.9 kg)   LMP 11/17/2016 (Exact Date)   SpO2 99%   BMI 33.15 kg/m   Visual Acuity Right Eye Distance:   Left Eye Distance:   Bilateral Distance:    Right Eye Near:   Left Eye Near:    Bilateral Near:     Physical Exam Nursing notes and Vital Signs reviewed. Appearance:  Patient appears stated age, and in no acute distress Eyes:  Pupils are equal, round, and reactive to light and accomodation.  Extraocular movement is intact.  Conjunctivae are not inflamed  Ears:  Canals normal.  Tympanic membranes normal.  Nose:  Mildly congested turbinates.  No sinus tenderness.   Pharynx:  Normal Neck:  Supple.  Enlarged posterior/lateral nodes  are palpated bilaterally, tender to palpation on the left.   Lungs:  Clear to auscultation.  Breath sounds are equal.  Moving air well. Heart:  Regular rate and rhythm without murmurs, rubs, or  gallops.  Abdomen:  Nontender without masses or hepatosplenomegaly.  Bowel sounds are present.  No CVA or flank tenderness.  Extremities:  No edema.  Skin:  No rash present.    UC Treatments / Results  Labs (all labs ordered are listed, but only abnormal results are displayed) Labs Reviewed - No data to display  EKG  EKG Interpretation None       Radiology Dg Chest 2 View  Result Date: 11/17/2016 CLINICAL DATA:  Cough x3 weeks EXAM: CHEST  2 VIEW COMPARISON:  01/17/2016 FINDINGS: The heart size and mediastinal contours are within normal limits. Mild interstitial prominence consistent chronic bronchitic change. No pulmonary edema or pneumonic consolidations. The visualized skeletal structures are unremarkable. No effusion or pneumothorax. IMPRESSION: Chronic mild bronchitic change of the lungs. Electronically Signed   By: Ashley Royalty M.D.   On: 11/17/2016 19:35    Procedures Procedures (including critical care time)  Medications Ordered in UC Medications  methylPREDNISolone sodium succinate (SOLU-MEDROL) 125 mg/2 mL injection 80 mg (80 mg Intramuscular Given 11/17/16 1959)     Initial Impression / Assessment and Plan / UC Course  I have reviewed the triage vital signs and the nursing notes.  Pertinent labs & imaging results that were available during my care of the patient were reviewed by me and considered in my medical decision making (see chart for details).    Suspect mild reactive airways disease. Administered Solumedrol 80mg . Begin doxycycline for atypical coverage. Begin prednisone burst/taper Saturday 11/18/16. Rx for Robitussin AC for night time cough.  Controlled Substance Prescriptions I have consulted the [place] Controlled Substances Registry for this patient, and feel the  risk/benefit ratio today is favorable for proceeding with this prescription for a controlled substance.  Take plain guaifenesin (1200mg  extended release tabs such as Mucinex) twice daily, with plenty of water, for cough and congestion.  Get adequate rest.    Also recommend using saline nasal spray several times daily and saline nasal irrigation (AYR is a common brand).   Try warm salt water gargles for sore throat.  Stop all antihistamines for now, and other non-prescription cough/cold preparations. Followup with Family Doctor if not improved in about 10 days.    Final Clinical Impressions(s) / UC Diagnoses   Final diagnoses:  Cough  Viral URI with cough    New Prescriptions New Prescriptions   DOXYCYCLINE (VIBRAMYCIN) 100 MG CAPSULE    Take 1 capsule (100 mg total) by mouth 2 (two) times daily. Take with food.   GUAIFENESIN-CODEINE 100-10 MG/5ML SYRUP    Take 35mL by mouth Q12hr PRN cough   PREDNISONE (DELTASONE) 20 MG TABLET    Take one tab by mouth twice daily for 5 days, then one daily. Take with food.         Kandra Nicolas, MD 11/17/16 2113

## 2016-11-17 NOTE — ED Triage Notes (Signed)
Dry cough x 1 month, sinus pain and pressure, fatigue, rt upper chest pain

## 2016-11-19 ENCOUNTER — Telehealth: Payer: Self-pay | Admitting: Emergency Medicine

## 2016-11-19 NOTE — Telephone Encounter (Signed)
TC from patient, She states that quantity was wrong on the prescription, per Dr. Burnett Harry call in Prednisone 20 mg # 15. Patient informed, Prescription called to [facility] in [place]

## 2016-12-08 ENCOUNTER — Encounter: Payer: Self-pay | Admitting: Family

## 2016-12-08 ENCOUNTER — Ambulatory Visit (INDEPENDENT_AMBULATORY_CARE_PROVIDER_SITE_OTHER): Payer: 59 | Admitting: Family

## 2016-12-08 VITALS — BP 126/57 | HR 90 | Temp 98.2°F | Resp 18 | Ht 68.0 in | Wt 221.6 lb

## 2016-12-08 DIAGNOSIS — J4520 Mild intermittent asthma, uncomplicated: Secondary | ICD-10-CM

## 2016-12-08 DIAGNOSIS — F329 Major depressive disorder, single episode, unspecified: Secondary | ICD-10-CM

## 2016-12-08 DIAGNOSIS — J45909 Unspecified asthma, uncomplicated: Secondary | ICD-10-CM | POA: Insufficient documentation

## 2016-12-08 DIAGNOSIS — F32A Depression, unspecified: Secondary | ICD-10-CM

## 2016-12-08 MED ORDER — SERTRALINE HCL 100 MG PO TABS: 100.0000 mg | ORAL_TABLET | Freq: Every day | ORAL | 3 refills | Status: DC

## 2016-12-08 MED ORDER — BUDESONIDE-FORMOTEROL FUMARATE 80-4.5 MCG/ACT IN AERO: 2.0000 | INHALATION_SPRAY | Freq: Two times a day (BID) | RESPIRATORY_TRACT | 3 refills | Status: DC

## 2016-12-08 MED ORDER — LORATADINE 10 MG PO TABS: 10.0000 mg | ORAL_TABLET | Freq: Every day | ORAL | 11 refills | Status: DC

## 2016-12-08 MED FILL — SYMBICORT 80-4.5 MCG INH: 80-4.5 | 30 days supply | Qty: 10 | Fill #0

## 2016-12-08 MED FILL — SERTRALINE HCL 100 MG TAB: 100 | 30 days supply | Qty: 30 | Fill #0

## 2016-12-08 NOTE — Assessment & Plan Note (Addendum)
Uncontrolled. Scored 10 on PHQ-9.  Will increase zoloft from 50mg  to 100mg .

## 2016-12-08 NOTE — Progress Notes (Signed)
Subjective:    Patient ID: [name], female    DOB: 11-Jul-1975, 41 y.o.   MRN: [id number]  HPI  [name] is a 41 yr old female who presents today with chief complaint of cough. Reports that she was seen in Urgent care on 11/17/16 due to cough (visit reviewed). CXR performed that day noted chronic mild bronchitic changes.  At that time she was treated with a steroid injection, oral prednisone, doxycycline and cheratussin. Initially cough seemed to improve until she completed prednisone. Reports that she has similar symptoms "every fall."  Reports that she has noted some upper airway wheezing. Denies associated fever.  Depression- reports that she has had a lot of stress lately.  Mom has parkinson's, son has seizure disorder, has another baby back in 2016.  Feels like depression has worsened.     Review of Systems    see HPI  Past Medical History:  Diagnosis Date  . Anemia   . Anxiety   . Chicken pox   . Depression   . GERD (gastroesophageal reflux disease)    tums prn-with pregnancy only  . Headache(784.0)   . IBS (irritable bowel syndrome)   . Migraines   . Placenta previa 2012   with current pregnancy-type and cross 2 Units per protocol  . PONV (postoperative nausea and vomiting)   . Shortness of breath dyspnea    with this pregnancy   . Syringomyelia, acquired [facility])      Social History   Social History  . Marital status: Married    Spouse name: N/A  . Number of children: N/A  . Years of education: N/A   Occupational History  . Not on file.   Social History Main Topics  . Smoking status: Never Smoker  . Smokeless tobacco: Never Used  . Alcohol use No     Comment: rarely, but not while preg  . Drug use: No  . Sexual activity: Yes   Other Topics Concern  . Not on file   Social History Narrative   Patient lives at home with her husband [name]). 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   Daughter born 2012   Son 2006  (special needs)   Married    Past Surgical History:  Procedure Laterality Date  . APPENDECTOMY  2005  . arthroscopic knee  1990  . CESAREAN SECTION  12/27/2010   Procedure: CESAREAN SECTION;  Surgeon: Shon Millet II;  Location: [facility];  Service: Gynecology;  Laterality: N/A;  . CESAREAN SECTION N/A 02/03/2015   Procedure: CESAREAN SECTION;  Surgeon: Everlene Farrier, MD;  Location: [facility];  Service: Obstetrics;  Laterality: N/A;  . left breast reduction  1996  . TUBAL LIGATION Bilateral 02/03/2015   Procedure: BILATERAL TUBAL LIGATION;  Surgeon: Everlene Farrier, MD;  Location: [facility];  Service: Obstetrics;  Laterality: Bilateral;  . WISDOM TOOTH EXTRACTION      Family History  Problem Relation Age of Onset  . High blood pressure Mother   . Arthritis Mother   . Hypertension Mother   . Alcohol abuse Father   . Arthritis Father   . High blood pressure Brother     Allergies  Allergen Reactions  . Topamax [Topiramate] Other (See Comments)    Pt states that she has trouble recalling words when she takes this medication.    Current Outpatient Prescriptions on File Prior to Visit  Medication Sig Dispense Refill  . ibuprofen (ADVIL,MOTRIN) 600 MG tablet  Take 1 tablet (600 mg total) by mouth every 6 (six) hours as needed. 30 tablet 1  . Prenatal Vit-Fe Fumarate-FA (PRENATAL MULTIVITAMIN) TABS tablet Take 1 tablet by mouth at bedtime.    . sertraline (ZOLOFT) 50 MG tablet Take 50 mg by mouth daily.     No current facility-administered medications on file prior to visit.     BP (!) 126/57 (BP Location: Right Arm, Cuff Size: Large)   Pulse 90   Temp 98.2 F (36.8 C) (Oral)   Resp 18   Ht 5\' 8"  (1.727 m)   Wt 221 lb 9.6 oz (100.5 kg)   LMP 11/17/2016 (Exact Date)   SpO2 100%   BMI 33.69 kg/m    Objective:   Physical Exam  Constitutional: She is oriented to person, place, and time. She appears well-developed and well-nourished.  HENT:  Head: Normocephalic and atraumatic.    Cardiovascular: Normal rate, regular rhythm and normal heart sounds.   No murmur heard. Pulmonary/Chest: Effort normal and breath sounds normal. No respiratory distress. She has no wheezes.  Musculoskeletal: She exhibits no edema.  Neurological: She is alert and oriented to person, place, and time.  Skin: Skin is warm and dry.  Psychiatric: She has a normal mood and affect. Her behavior is normal. Judgment and thought content normal.          Assessment & Plan:

## 2016-12-08 NOTE — Patient Instructions (Signed)
Please begin claritin. Begin symbicort. Call if symptoms worsen or if not improved in 1-2 weeks.

## 2016-12-08 NOTE — Assessment & Plan Note (Signed)
I think her cough is related to asthma symptoms.  Advised patient as follows:  Begin claritin once daily, add symbicort. Call if symptoms worsen or if they fail to improve.

## 2017-01-07 NOTE — Progress Notes (Signed)
Subjective:    Patient ID: [name], female    DOB: September 23, 1975, 41 y.o.   MRN: [id number]  HPI   [name] is a 41 yr old female who presents today for follow up.  Depression- noted worsening symptoms last visit. We increased zoloft from 50mg  to 100mg . Reports zero motivation, can't seem to motivate.  Reports things are not going well with her mom, holidays are stressful, son's seizures are not well controlled.  Hates his job.  Sleeping well. Appetite is fine. Denies SI/HI, + tearfulness.  Anxiety is "worse." Having constant worry.  Doesn't want to leave her house.    Cough- was felt to be related to asthma symptoms. We added claritin/symbicort last visit. Reports that after about 1.5 weeks symptoms improved.     Review of Systems See HPI Past Medical History:  Diagnosis Date  . Anemia   . Anxiety   . Chicken pox   . Depression   . GERD (gastroesophageal reflux disease)    tums prn-with pregnancy only  . Headache(784.0)   . IBS (irritable bowel syndrome)   . Migraines   . Placenta previa 2012   with current pregnancy-type and cross 2 Units per protocol  . PONV (postoperative nausea and vomiting)   . Shortness of breath dyspnea    with this pregnancy   . Syringomyelia, acquired [facility])      Social History   Socioeconomic History  . Marital status: Married    Spouse name: Not on file  . Number of children: Not on file  . Years of education: Not on file  . Highest education level: Not on file  Social Needs  . Financial resource strain: Not on file  . Food insecurity - worry: Not on file  . Food insecurity - inability: Not on file  . Transportation needs - medical: Not on file  . Transportation needs - non-medical: Not on file  Occupational History  . Not on file  Tobacco Use  . Smoking status: Never Smoker  . Smokeless tobacco: Never Used  Substance and Sexual Activity  . Alcohol use: No    Comment: rarely, but not while preg  . Drug use: No  . Sexual  activity: Yes  Other Topics Concern  . Not on file  Social History Narrative   Patient lives at home with her husband [name]). 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   Daughter born 2012   Son 2006 (special needs)   Married    Past Surgical History:  Procedure Laterality Date  . APPENDECTOMY  2005  . arthroscopic knee  1990  . BILATERAL TUBAL LIGATION Bilateral 02/03/2015   Performed by Everlene Farrier, MD at [facility]  . CESAREAN SECTION N/A 02/03/2015   Performed by Everlene Farrier, MD at [facility]  . CESAREAN SECTION N/A 12/27/2010   Performed by Allena Katz, MD at [facility]  . left breast reduction  1996  . WISDOM TOOTH EXTRACTION      Family History  Problem Relation Age of Onset  . High blood pressure Mother   . Arthritis Mother   . Hypertension Mother   . Alcohol abuse Father   . Arthritis Father   . High blood pressure Brother     Allergies  Allergen Reactions  . Topamax [Topiramate] Other (See Comments)    Pt states that she has trouble recalling words when she takes this medication.    Current Outpatient Medications on  File Prior to Visit  Medication Sig Dispense Refill  . budesonide-formoterol (SYMBICORT) 80-4.5 MCG/ACT inhaler Inhale 2 puffs into the lungs 2 (two) times daily. 1 Inhaler 3  . ibuprofen (ADVIL,MOTRIN) 600 MG tablet Take 1 tablet (600 mg total) by mouth every 6 (six) hours as needed. 30 tablet 1  . loratadine (CLARITIN) 10 MG tablet Take 1 tablet (10 mg total) by mouth daily. 30 tablet 11  . Prenatal Vit-Fe Fumarate-FA (PRENATAL MULTIVITAMIN) TABS tablet Take 1 tablet by mouth at bedtime.    . sertraline (ZOLOFT) 100 MG tablet Take 1 tablet (100 mg total) by mouth daily. 30 tablet 3   No current facility-administered medications on file prior to visit.     BP 119/74 (BP Location: Right Arm, Patient Position: Sitting, Cuff Size: Small)   Pulse 75   Temp 97.8 F (36.6 C) (Oral)   Resp 16   Ht 5\' 8"  (1.727 m)    Wt 223 lb (101.2 kg)   LMP 01/07/2017   SpO2 98%   BMI 33.91 kg/m       Objective:   Physical Exam  Constitutional: She is oriented to person, place, and time. She appears well-developed and well-nourished.  Cardiovascular: Normal rate, regular rhythm and normal heart sounds.  No murmur heard. Pulmonary/Chest: Effort normal and breath sounds normal. No respiratory distress. She has no wheezes.  Musculoskeletal: She exhibits no edema.  Neurological: She is alert and oriented to person, place, and time.  Psychiatric: Her behavior is normal. Judgment and thought content normal.  tearful          Assessment & Plan:  Cough-resolved. Continue current meds  Depression- uncontrolled. Advised pt as follows:  [phone]  Fatigue- check cbc, cmet, TSH.  Please begin effexor 1 tab once daily for 3 days, then increase to 2 tabs once daily. Call if new/worsening symptoms. Go to ER or call 911 if you develop thoughts of hurting yourself or others.  Please contact [facility] to schedule an appointment with a counselor- [phone].

## 2017-01-08 ENCOUNTER — Ambulatory Visit (INDEPENDENT_AMBULATORY_CARE_PROVIDER_SITE_OTHER): Payer: 59 | Admitting: Family

## 2017-01-08 ENCOUNTER — Encounter: Payer: Self-pay | Admitting: Family

## 2017-01-08 ENCOUNTER — Telehealth: Payer: Self-pay | Admitting: Family

## 2017-01-08 VITALS — BP 119/74 | HR 75 | Temp 97.8°F | Resp 16 | Ht 68.0 in | Wt 223.0 lb

## 2017-01-08 DIAGNOSIS — F329 Major depressive disorder, single episode, unspecified: Secondary | ICD-10-CM

## 2017-01-08 DIAGNOSIS — R5383 Other fatigue: Secondary | ICD-10-CM

## 2017-01-08 DIAGNOSIS — R059 Cough, unspecified: Secondary | ICD-10-CM

## 2017-01-08 DIAGNOSIS — F32A Depression, unspecified: Secondary | ICD-10-CM

## 2017-01-08 DIAGNOSIS — R05 Cough: Secondary | ICD-10-CM

## 2017-01-08 LAB — COMPREHENSIVE METABOLIC PANEL
ALBUMIN: 3.9 g/dL (ref 3.5–5.2)
ALK PHOS: 86 U/L (ref 39–117)
ALT: 15 U/L (ref 0–35)
AST: 15 U/L (ref 0–37)
BUN: 10 mg/dL (ref 6–23)
CALCIUM: 9.2 mg/dL (ref 8.4–10.5)
CO2: 29 mEq/L (ref 19–32)
Chloride: 104 mEq/L (ref 96–112)
Creatinine, Ser: 0.67 mg/dL (ref 0.40–1.20)
GFR: 103.65 mL/min (ref >60.00)
GLUCOSE: 97 mg/dL (ref 70–99)
POTASSIUM: 4 meq/L (ref 3.5–5.1)
Sodium: 138 mEq/L (ref 135–145)
TOTAL PROTEIN: 6.9 g/dL (ref 6.0–8.3)
Total Bilirubin: 0.4 mg/dL (ref 0.2–1.2)

## 2017-01-08 LAB — CBC WITH DIFFERENTIAL/PLATELET
Basophils Absolute: 0.1 10*3/uL (ref 0.0–0.1)
Basophils Relative: 0.7 % (ref 0.0–3.0)
EOS PCT: 1.2 % (ref 0.0–5.0)
Eosinophils Absolute: 0.1 10*3/uL (ref 0.0–0.7)
HEMATOCRIT: 36.7 % (ref 36.0–46.0)
Hemoglobin: 11.4 g/dL — ABNORMAL LOW (ref 12.0–15.0)
LYMPHS ABS: 2.3 10*3/uL (ref 0.7–4.0)
Lymphocytes Relative: 20.5 % (ref 12.0–46.0)
MCHC: 31.1 g/dL (ref 30.0–36.0)
MCV: 78.9 fl (ref 78.0–100.0)
MONOS PCT: 7.3 % (ref 3.0–12.0)
Monocytes Absolute: 0.8 10*3/uL (ref 0.1–1.0)
NEUTROS PCT: 70.3 % (ref 43.0–77.0)
Neutro Abs: 7.8 10*3/uL — ABNORMAL HIGH (ref 1.4–7.7)
Platelets: 450 10*3/uL — ABNORMAL HIGH (ref 150.0–400.0)
RBC: 4.65 Mil/uL (ref 3.87–5.11)
RDW: 15.4 % (ref 11.5–15.5)
WBC: 11.1 10*3/uL — ABNORMAL HIGH (ref 4.0–10.5)

## 2017-01-08 LAB — TSH: TSH: 2.09 u[IU]/mL (ref 0.35–4.50)

## 2017-01-08 MED ORDER — VENLAFAXINE HCL ER 37.5 MG PO CP24: 37.5000 mg | ORAL_CAPSULE | Freq: Two times a day (BID) | ORAL | 0 refills | Status: DC

## 2017-01-08 MED FILL — VENLAFAXINE HCL ER 37.5 MG: 37.5 | 30 days supply | Qty: 60 | Fill #0

## 2017-01-08 NOTE — Patient Instructions (Addendum)
Please begin effexor 1 tab once daily for 3 days, then increase to 2 tabs once daily. Call if new/worsening symptoms. Go to ER or call 911 if you develop thoughts of hurting yourself or others.  Please contact [facility] to schedule an appointment with a counselor- [phone]

## 2017-01-08 NOTE — Telephone Encounter (Signed)
See my chart message

## 2017-01-09 NOTE — Telephone Encounter (Signed)
See my chart message

## 2017-01-09 NOTE — Telephone Encounter (Signed)
Sent message to lab to request add on. Awaiting response.

## 2017-01-09 NOTE — Telephone Encounter (Signed)
Melissa-- results have been released. Please advise re: pt's other concerns.

## 2017-01-09 NOTE — Telephone Encounter (Signed)
Received call that test cannot be added to previous specimen. Please advise?

## 2017-01-09 NOTE — Telephone Encounter (Signed)
Can you please ask lab to add on peripheral smear to her cbc? Dx leukocytosis?    Let pt know I will look more closely at her blood cells as above. We can discuss further at her cpx.  I doubt cortisol levels are contributing to her elevated wbc.

## 2017-01-23 ENCOUNTER — Encounter: Payer: Self-pay | Admitting: Neurology

## 2017-01-23 ENCOUNTER — Ambulatory Visit (INDEPENDENT_AMBULATORY_CARE_PROVIDER_SITE_OTHER): Payer: 59 | Admitting: Family

## 2017-01-23 ENCOUNTER — Encounter: Payer: Self-pay | Admitting: Family

## 2017-01-23 VITALS — BP 121/64 | HR 74 | Temp 97.7°F | Resp 18 | Ht 68.0 in | Wt 221.6 lb

## 2017-01-23 DIAGNOSIS — D649 Anemia, unspecified: Secondary | ICD-10-CM | POA: Diagnosis not present

## 2017-01-23 DIAGNOSIS — R21 Rash and other nonspecific skin eruption: Secondary | ICD-10-CM | POA: Diagnosis not present

## 2017-01-23 DIAGNOSIS — F418 Other specified anxiety disorders: Secondary | ICD-10-CM

## 2017-01-23 DIAGNOSIS — G950 Syringomyelia and syringobulbia: Secondary | ICD-10-CM

## 2017-01-23 DIAGNOSIS — Z0000 Encounter for general adult medical examination without abnormal findings: Secondary | ICD-10-CM | POA: Diagnosis not present

## 2017-01-23 LAB — SEDIMENTATION RATE: Sed Rate: 20 mm/hr (ref 0–20)

## 2017-01-23 LAB — LIPID PANEL
CHOL/HDL RATIO: 4
CHOLESTEROL: 199 mg/dL (ref 0–200)
HDL: 56.3 mg/dL (ref >39.00)
LDL CALC: 126 mg/dL — AB (ref 0–99)
NonHDL: 142.84
Triglycerides: 86 mg/dL (ref 0.0–149.0)
VLDL: 17.2 mg/dL (ref 0.0–40.0)

## 2017-01-23 LAB — URINALYSIS, ROUTINE W REFLEX MICROSCOPIC
Bilirubin Urine: NEGATIVE
KETONES UR: NEGATIVE
Leukocytes, UA: NEGATIVE
NITRITE: NEGATIVE
TOTAL PROTEIN, URINE-UPE24: NEGATIVE
URINE GLUCOSE: NEGATIVE
UROBILINOGEN UA: 0.2 (ref 0.0–1.0)
pH: 5.5 (ref 5.0–8.0)

## 2017-01-23 LAB — CBC WITH DIFFERENTIAL/PLATELET
BASOS PCT: 0.8 % (ref 0.0–3.0)
Basophils Absolute: 0.1 10*3/uL (ref 0.0–0.1)
EOS PCT: 1.9 % (ref 0.0–5.0)
Eosinophils Absolute: 0.2 10*3/uL (ref 0.0–0.7)
HCT: 37.8 % (ref 36.0–46.0)
HEMOGLOBIN: 11.8 g/dL — AB (ref 12.0–15.0)
Lymphocytes Relative: 20 % (ref 12.0–46.0)
Lymphs Abs: 2.3 10*3/uL (ref 0.7–4.0)
MCHC: 31.3 g/dL (ref 30.0–36.0)
MCV: 78.8 fl (ref 78.0–100.0)
MONO ABS: 0.6 10*3/uL (ref 0.1–1.0)
Monocytes Relative: 5.3 % (ref 3.0–12.0)
NEUTROS ABS: 8.4 10*3/uL — AB (ref 1.4–7.7)
Neutrophils Relative %: 72 % (ref 43.0–77.0)
PLATELETS: 479 10*3/uL — AB (ref 150.0–400.0)
RBC: 4.8 Mil/uL (ref 3.87–5.11)
RDW: 16 % — AB (ref 11.5–15.5)
WBC: 11.6 10*3/uL — AB (ref 4.0–10.5)

## 2017-01-23 LAB — TSH: TSH: 1.84 u[IU]/mL (ref 0.35–4.50)

## 2017-01-23 LAB — FERRITIN: Ferritin: 12.8 ng/mL (ref 10.0–291.0)

## 2017-01-23 LAB — IRON: Iron: 48 ug/dL (ref 42–145)

## 2017-01-23 MED ORDER — VENLAFAXINE HCL ER 75 MG PO CP24: 75.0000 mg | ORAL_CAPSULE | Freq: Every day | ORAL | 1 refills | Status: DC

## 2017-01-23 MED FILL — VENLAFAXINE HCL ER 75 MG CA: 75 | 30 days supply | Qty: 30 | Fill #0

## 2017-01-23 NOTE — Progress Notes (Signed)
Subjective:    Patient ID: [name], female    DOB: 04-02-1975, 41 y.o.   MRN: [id number]  HPI   [name] is a 41 yr old female who presents today for cpx.  Immunizations: Tdap 2016, declines flu shot Diet: needs improvement Exercise: not exercising Pap Smear: 07/14/14 Mammogram: due later this month Vision: up to date Dental: due Wt Readings from Last 3 Encounters:  01/23/17 221 lb 9.6 oz (100.5 kg)  01/08/17 223 lb (101.2 kg)  12/08/16 221 lb 9.6 oz (100.5 kg)   Depression/anxiety- last visit we added effexor to her regimen. Only taking effexor 37.5 mg once daily, never increased to bid due to fear of sleepiness. Notes mood and anxiety remain unchanged. Very unmotivated.    Review of Systems  Constitutional: Positive for chills. Negative for unexpected weight change.  HENT: Negative for hearing loss and rhinorrhea.   Eyes: Negative for visual disturbance.  Respiratory: Negative for cough.   Cardiovascular: Negative for leg swelling.  Gastrointestinal: Negative for diarrhea.  Genitourinary: Negative for dysuria, frequency and genital sores.  Musculoskeletal:       Some left hip pain  Skin: Negative for rash.       Reports some redness on her cheeks  Neurological:       Reports that she has some numbness in her feet.     Hematological: Negative for adenopathy.  Psychiatric/Behavioral:       See HPI     Past Medical History:  Diagnosis Date  . Anemia   . Anxiety   . Chicken pox   . Depression   . GERD (gastroesophageal reflux disease)    tums prn-with pregnancy only  . Headache(784.0)   . IBS (irritable bowel syndrome)   . Migraines   . Placenta previa 2012   with current pregnancy-type and cross 2 Units per protocol  . PONV (postoperative nausea and vomiting)   . Shortness of breath dyspnea    with this pregnancy   . Syringomyelia, acquired [facility])      Social History   Socioeconomic History  . Marital status: Married    Spouse name: Not on  file  . Number of children: Not on file  . Years of education: Not on file  . Highest education level: Not on file  Social Needs  . Financial resource strain: Not on file  . Food insecurity - worry: Not on file  . Food insecurity - inability: Not on file  . Transportation needs - medical: Not on file  . Transportation needs - non-medical: Not on file  Occupational History  . Not on file  Tobacco Use  . Smoking status: Never Smoker  . Smokeless tobacco: Never Used  Substance and Sexual Activity  . Alcohol use: No    Comment: rarely, but not while preg  . Drug use: No  . Sexual activity: Yes  Other Topics Concern  . Not on file  Social History Narrative   Patient lives at home with her husband [name]). 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   Daughter born 2012   Son 2006 (special needs)   Married    Past Surgical History:  Procedure Laterality Date  . APPENDECTOMY  2005  . arthroscopic knee  1990  . CESAREAN SECTION  12/27/2010   Procedure: CESAREAN SECTION;  Surgeon: Shon Millet II;  Location: [facility];  Service: Gynecology;  Laterality: N/A;  . CESAREAN SECTION N/A 02/03/2015   Procedure:  CESAREAN SECTION;  Surgeon: Everlene Farrier, MD;  Location: [place];  Service: Obstetrics;  Laterality: N/A;  . left breast reduction  1996  . TUBAL LIGATION Bilateral 02/03/2015   Procedure: BILATERAL TUBAL LIGATION;  Surgeon: Everlene Farrier, MD;  Location: [place] [facility];  Service: Obstetrics;  Laterality: Bilateral;  . WISDOM TOOTH EXTRACTION      Family History  Problem Relation Age of Onset  . High blood pressure Mother   . Arthritis Mother   . Hypertension Mother   . Alcohol abuse Father   . Arthritis Father   . High blood pressure Brother     Allergies  Allergen Reactions  . Topamax [Topiramate] Other (See Comments)    Pt states that she has trouble recalling words when she takes this medication.    Current Outpatient Medications on File Prior to  Visit  Medication Sig Dispense Refill  . budesonide-formoterol (SYMBICORT) 80-4.5 MCG/ACT inhaler Inhale 2 puffs into the lungs 2 (two) times daily. 1 Inhaler 3  . ibuprofen (ADVIL,MOTRIN) 600 MG tablet Take 1 tablet (600 mg total) by mouth every 6 (six) hours as needed. 30 tablet 1  . loratadine (CLARITIN) 10 MG tablet Take 1 tablet (10 mg total) by mouth daily. 30 tablet 11  . Prenatal Vit-Fe Fumarate-FA (PRENATAL MULTIVITAMIN) TABS tablet Take 1 tablet by mouth at bedtime.    . sertraline (ZOLOFT) 100 MG tablet Take 1 tablet (100 mg total) by mouth daily. 30 tablet 3  . venlafaxine XR (EFFEXOR XR) 37.5 MG 24 hr capsule Take 1 capsule (37.5 mg total) 2 (two) times daily by mouth. 60 capsule 0   No current facility-administered medications on file prior to visit.     LMP 01/07/2017       Objective:   Physical Exam  Physical Exam  Constitutional: She is oriented to person, place, and time. She appears well-developed and well-nourished. No distress.  HENT:  Head: Normocephalic and atraumatic.  Right Ear: Tympanic membrane and ear canal normal.  Left Ear: Tympanic membrane and ear canal normal.  Mouth/Throat: Oropharynx is clear and moist.  Eyes: Pupils are equal, round, and reactive to light. No scleral icterus.  Neck: Normal range of motion. No thyromegaly present.  Cardiovascular: Normal rate and regular rhythm.   No murmur heard. Pulmonary/Chest: Effort normal and breath sounds normal. No respiratory distress. He has no wheezes. She has no rales. She exhibits no tenderness.  Abdominal: Soft. Bowel sounds are normal. She exhibits no distension and no mass. There is no tenderness. There is no rebound and no guarding.  Musculoskeletal: She exhibits no edema.  Lymphadenopathy:    She has no cervical adenopathy.  Neurological: She is alert and oriented to person, place, and time. She has normal patellar reflexes. She exhibits normal muscle tone. Coordination normal.  Skin: Skin is  warm and dry. + erythematous rash noted bilateral cheeks Psychiatric: She has a normal mood and affect. Her behavior is normal. Judgment and thought content normal.  Breast/pelvic: deferred         Assessment & Plan:   Preventative care- discussed healthy diet, regular exercise.  Obtain routine lab work. Mammogram later this month. Pap per gyn.   Anxiety/depression-  Will increase effexor xr to 90m once daily.   Skin rash- check ANA/esr to evaluate for lupus due to c/o fatigue, facial rash, some joint pain.   Syringomyelia- refer back to neurology due to c/o worsening numbness in her feet.   Anemia- mild anemia last visit. Repeat cbc as  well as iron studies.     Assessment & Plan:

## 2017-01-23 NOTE — Patient Instructions (Addendum)
Please complete lab work prior to leaving. We will work on scheduling follow up with Dr. Moshe Cipro. Change effexor to 75mg  xr once daily.  Schedule with a counselor.

## 2017-01-24 ENCOUNTER — Telehealth: Payer: Self-pay | Admitting: Family

## 2017-01-24 ENCOUNTER — Encounter: Payer: Self-pay | Admitting: Family

## 2017-01-24 LAB — ANA: Anti Nuclear Antibody(ANA): NEGATIVE

## 2017-01-24 NOTE — Telephone Encounter (Signed)
-----   Message from Katha Hamming sent at 01/24/2017  2:12 PM EST ----- Regarding: mammogram Patient stated she is going to have her mammogram at her OB-GYN's office in January.  Thanks, Hoyle Sauer

## 2017-02-05 MED FILL — SERTRALINE HCL 100 MG TAB: 100 | 30 days supply | Qty: 30 | Fill #1

## 2017-02-10 ENCOUNTER — Encounter (HOSPITAL_BASED_OUTPATIENT_CLINIC_OR_DEPARTMENT_OTHER): Payer: Self-pay | Admitting: Emergency Medicine

## 2017-02-10 ENCOUNTER — Emergency Department (HOSPITAL_BASED_OUTPATIENT_CLINIC_OR_DEPARTMENT_OTHER)
Admission: EM | Admit: 2017-02-10 | Discharge: 2017-02-10 | Disposition: A | Discharge status: Home / Self Care | Payer: 59 | Attending: Emergency Medicine | Admitting: Emergency Medicine

## 2017-02-10 ENCOUNTER — Other Ambulatory Visit: Payer: Self-pay

## 2017-02-10 DIAGNOSIS — J45909 Unspecified asthma, uncomplicated: Secondary | ICD-10-CM | POA: Insufficient documentation

## 2017-02-10 DIAGNOSIS — R5383 Other fatigue: Secondary | ICD-10-CM | POA: Diagnosis not present

## 2017-02-10 DIAGNOSIS — R202 Paresthesia of skin: Secondary | ICD-10-CM | POA: Insufficient documentation

## 2017-02-10 LAB — CBC WITH DIFFERENTIAL/PLATELET
BASOS PCT: 0 %
Basophils Absolute: 0.1 10*3/uL (ref 0.0–0.1)
EOS ABS: 0.2 10*3/uL (ref 0.0–0.7)
Eosinophils Relative: 1 %
HEMATOCRIT: 36.7 % (ref 36.0–46.0)
Hemoglobin: 11.4 g/dL — ABNORMAL LOW (ref 12.0–15.0)
Lymphocytes Relative: 19 %
Lymphs Abs: 2.6 10*3/uL (ref 0.7–4.0)
MCH: 24.5 pg — AB (ref 26.0–34.0)
MCHC: 31.1 g/dL (ref 30.0–36.0)
MCV: 78.8 fL (ref 78.0–100.0)
MONO ABS: 0.8 10*3/uL (ref 0.1–1.0)
MONOS PCT: 6 %
NEUTROS ABS: 9.8 10*3/uL — AB (ref 1.7–7.7)
Neutrophils Relative %: 74 %
Platelets: 453 10*3/uL — ABNORMAL HIGH (ref 150–400)
RBC: 4.66 MIL/uL (ref 3.87–5.11)
RDW: 15.2 % (ref 11.5–15.5)
WBC: 13.5 10*3/uL — ABNORMAL HIGH (ref 4.0–10.5)

## 2017-02-10 LAB — BASIC METABOLIC PANEL
Anion gap: 7 (ref 5–15)
BUN: 10 mg/dL (ref 6–20)
CALCIUM: 9 mg/dL (ref 8.9–10.3)
CO2: 26 mmol/L (ref 22–32)
CREATININE: 0.79 mg/dL (ref 0.44–1.00)
Chloride: 103 mmol/L (ref 101–111)
GFR calc Af Amer: >60 mL/min (ref >60)
GFR calc non Af Amer: >60 mL/min (ref >60)
GLUCOSE: 110 mg/dL — AB (ref 65–99)
Potassium: 3.9 mmol/L (ref 3.5–5.1)
Sodium: 136 mmol/L (ref 135–145)

## 2017-02-10 NOTE — ED Provider Notes (Signed)
Emergency Department Provider Note   I have reviewed the triage vital signs and the nursing notes.   HISTORY  Chief Complaint Numbness   HPI [name] is a 41 y.o. female resents to the emergency department for evaluation of tingling/numbness sensation to the bottom of both feet along with similar sensation in the hands.  She describes intermittent burning pain in these areas but none currently.  She feels like she is very fatigued and running on very low energy all the time.  This most recent episode started after Thanksgiving.  Patient states she has been followed by neurology in the past but felt like she was brushed off.  She spoke with her primary care physician regarding the symptoms and was referred to a neurologist but is not able to be seen until March of next year.  She denies any unilateral weakness or numbness.  No sudden severe headaches.  No vision changes or difficulty swallowing.  No difficulty walking.  Past Medical History:  Diagnosis Date  . Anemia   . Anxiety   . Chicken pox   . Depression   . GERD (gastroesophageal reflux disease)    tums prn-with pregnancy only  . Headache(784.0)   . IBS (irritable bowel syndrome)   . Migraines   . Placenta previa 2012   with current pregnancy-type and cross 2 Units per protocol  . PONV (postoperative nausea and vomiting)   . Shortness of breath dyspnea    with this pregnancy   . Syringomyelia, acquired [facility])     Patient Active Problem List   Diagnosis Date Noted  . Asthma 12/08/2016  . S/P cesarean section 02/03/2015  . Low back pain 04/24/2014  . Chest pain 01/01/2014  . Scalp lesion 01/01/2014  . Wheezing 11/19/2013  . Allergic rhinitis 11/11/2013  . Sinusitis, acute maxillary 11/11/2013  . Daytime somnolence 09/04/2013  . Abdominal pain, other specified site 08/21/2013  . Syringomyelia ([place]) 05/09/2013  . Mass 05/09/2013  . Paresthesia 07/19/2012  . Depression 07/19/2012  . Sensory disturbance  06/27/2012  . Leg weakness 06/27/2012  . Leukocytosis 06/27/2012  . Microcytic anemia 06/27/2012    Past Surgical History:  Procedure Laterality Date  . APPENDECTOMY  2005  . arthroscopic knee  1990  . CESAREAN SECTION  12/27/2010   Procedure: CESAREAN SECTION;  Surgeon: Shon Millet II;  Location: [place] [facility];  Service: Gynecology;  Laterality: N/A;  . CESAREAN SECTION N/A 02/03/2015   Procedure: CESAREAN SECTION;  Surgeon: Everlene Farrier, MD;  Location: [facility];  Service: Obstetrics;  Laterality: N/A;  . left breast reduction  1996  . TUBAL LIGATION Bilateral 02/03/2015   Procedure: BILATERAL TUBAL LIGATION;  Surgeon: Everlene Farrier, MD;  Location: [facility];  Service: Obstetrics;  Laterality: Bilateral;  . WISDOM TOOTH EXTRACTION      Current Outpatient Rx  . Order #: [id number] Class: Normal  . Order #: [id number] Class: Print  . Order #: [id number] Class: OTC  . Order #: [id number] Class: Historical Med  . Order #: [id number] Class: Normal  . Order #: [id number] Class: Normal    Allergies Topamax [topiramate]  Family History  Problem Relation Age of Onset  . High blood pressure Mother   . Arthritis Mother   . Hypertension Mother   . Other Mother        "Multisystem atrophy"  . Alcohol abuse Father   . Arthritis Father   . High blood pressure Brother     Social History Social History   Tobacco Use  .  Smoking status: Never Smoker  . Smokeless tobacco: Never Used  Substance Use Topics  . Alcohol use: No    Comment: rarely, but not while preg  . Drug use: No    Review of Systems  Constitutional: No fever/chills Eyes: No visual changes. ENT: No sore throat. Cardiovascular: Denies chest pain. Respiratory: Denies shortness of breath. Gastrointestinal: No abdominal pain. No nausea, no vomiting. No diarrhea. No constipation. Genitourinary: Negative for dysuria. Musculoskeletal: Negative for back pain. Skin: Negative for rash. Neurological: Negative for headaches, focal  weakness. Positive numbness in bilateral hands/feet.   10-point ROS otherwise negative.  ____________________________________________   PHYSICAL EXAM:  VITAL SIGNS: ED Triage Vitals [02/10/17 1418]  Enc Vitals Group     BP (!) 141/60     Pulse Rate 81     Resp 18     Temp 98.1 F (36.7 C)     Temp Source Oral     SpO2 99 %     Weight 220 lb (99.8 kg)     Height 5\' 8"  (1.727 m)   Constitutional: Alert and oriented. Well appearing and in no acute distress. Eyes: Conjunctivae are normal.  Head: Atraumatic. Nose: No congestion/rhinnorhea. Mouth/Throat: Mucous membranes are moist. Neck: No stridor.  Cardiovascular: Normal rate, regular rhythm. Good peripheral circulation. Grossly normal heart sounds.   Respiratory: Normal respiratory effort.  No retractions. Lungs CTAB. Gastrointestinal: Soft and nontender. No distention.  Musculoskeletal: No lower extremity tenderness nor edema. No gross deformities of extremities. Neurologic:  Normal speech and language. No gross focal neurologic deficits are appreciated. Normal CN exam 2-12. No pronator drift.  Skin:  Skin is warm, dry and intact. No rash noted.  ____________________________________________   LABS (all labs ordered are listed, but only abnormal results are displayed)  Labs Reviewed  BASIC METABOLIC PANEL - Abnormal; Notable for the following components:      Result Value   Glucose, Bld 110 (*)    All other components within normal limits  CBC WITH DIFFERENTIAL/PLATELET - Abnormal; Notable for the following components:   WBC 13.5 (*)    Hemoglobin 11.4 (*)    MCH 24.5 (*)    Platelets 453 (*)    Neutro Abs 9.8 (*)    All other components within normal limits   ____________________________________________  EKG   EKG Interpretation  Date/Time:  Saturday February 10 2017 15:34:45 EST Ventricular Rate:  73 PR Interval:    QRS Duration: 92 QT Interval:  424 QTC Calculation: 468 R Axis:   33 Text  Interpretation:  Sinus rhythm Low voltage, precordial leads No STEMI.  Confirmed by Nanda Quinton [phone]) on 02/10/2017 4:31:11 PM      ____________________________________________   PROCEDURES  Procedure(s) performed:   Procedures  None ____________________________________________   INITIAL IMPRESSION / ASSESSMENT AND PLAN / ED COURSE  Pertinent labs & imaging results that were available during my care of the patient were reviewed by me and considered in my medical decision making (see chart for details).  Patient presents with bilateral tingling and numbness in the hands and feet.  No unilateral symptoms.  Neurological exam not consistent with a central process.  Symptoms most consistent with a peripheral neuropathy.  Patient does not have diabetes.  Given her severe fatigue plan to obtain baseline labs and EKG.  I will provide contact information for alternative neurology practice that she can call to try and get an appointment sooner.  I do not see indication for emergent head imaging at this time.  Labs unremarkable. Provided contact information for local Neurology to see if she can get in sooner than March. No indication for MRI at this time.  At this time, I do not feel there is any life-threatening condition present. I have reviewed and discussed all results (EKG, imaging, lab, urine as appropriate), exam findings with patient. I have reviewed nursing notes and appropriate previous records.  I feel the patient is safe to be discharged home without further emergent workup. Discussed usual and customary return precautions. Patient and family (if present) verbalize understanding and are comfortable with this plan.  Patient will follow-up with their primary care provider. If they do not have a primary care provider, information for follow-up has been provided to them. All questions have been answered.  ____________________________________________  FINAL CLINICAL IMPRESSION(S) / ED  DIAGNOSES  Final diagnoses:  Paresthesia    Note:  This document was prepared using Dragon voice recognition software and may include unintentional dictation errors.  Nanda Quinton, MD Emergency Medicine    Long, Wonda [facility], MD 02/10/17 [phone]

## 2017-02-10 NOTE — ED Triage Notes (Addendum)
Patient states that she has had trouble with numbness and tingling to her bilateral feet, finger tips and lips. She reports that she has had the burning in her feet since the beginning of the month, she then started to have the finger tips earlier this week. The patient states that her movement is now worsening today  - she was trying to type and she has " to think about it more than usual" She reports that she had this sensation in her feet with her MD and she was referred to a Neuro dr, but they are unable to see her until March

## 2017-02-10 NOTE — ED Notes (Signed)
Pt reports she feels fatigued beyond her norm since thanksgiving. Denies URI/flu symptoms. Reports she was having difficulty processing info while working earlier today. Symptoms have been gradually worsening

## 2017-02-10 NOTE — ED Notes (Signed)
Pt ambulatory to d/c window with steady gait

## 2017-02-10 NOTE — ED Notes (Signed)
ED Provider at bedside. 

## 2017-02-10 NOTE — Discharge Instructions (Signed)
You were seen in the ED today with tingling/numbness in the hands and feet with some associated fatigue. Your labs and EKG are unremarkable. I have provided the contact information for two Neurology groups in your discharge paperwork. Return to the ED immediately with any weakness or numbness that is more on one side vs the other or if you develop any weakness in the face or difficulty speaking or swallowing.

## 2017-03-02 MED FILL — VENLAFAXINE HCL ER 75 MG CA: 75 | 30 days supply | Qty: 30 | Fill #1

## 2017-03-02 MED FILL — SERTRALINE HCL 100 MG TAB: 100 | 30 days supply | Qty: 30 | Fill #2

## 2017-03-06 ENCOUNTER — Ambulatory Visit: Payer: 59 | Admitting: Family

## 2017-03-30 ENCOUNTER — Telehealth: Payer: Self-pay | Admitting: Family

## 2017-03-30 ENCOUNTER — Ambulatory Visit: Payer: 59 | Admitting: Family

## 2017-03-30 ENCOUNTER — Other Ambulatory Visit: Payer: 59

## 2017-03-30 ENCOUNTER — Encounter: Payer: Self-pay | Admitting: Family

## 2017-03-30 VITALS — BP 128/70 | HR 78 | Temp 98.2°F | Resp 16 | Ht 68.0 in | Wt 226.8 lb

## 2017-03-30 DIAGNOSIS — F418 Other specified anxiety disorders: Secondary | ICD-10-CM

## 2017-03-30 DIAGNOSIS — D649 Anemia, unspecified: Secondary | ICD-10-CM

## 2017-03-30 DIAGNOSIS — R202 Paresthesia of skin: Secondary | ICD-10-CM | POA: Diagnosis not present

## 2017-03-30 DIAGNOSIS — E538 Deficiency of other specified B group vitamins: Secondary | ICD-10-CM

## 2017-03-30 LAB — CBC WITH DIFFERENTIAL/PLATELET
BASOS ABS: 113 {cells}/uL (ref 0–200)
BASOS PCT: 1.1 %
EOS ABS: 237 {cells}/uL (ref 15–500)
Eosinophils Relative: 2.3 %
HCT: 35.4 % (ref 35.0–45.0)
HEMOGLOBIN: 11.6 g/dL — AB (ref 11.7–15.5)
Lymphs Abs: 1700 cells/uL (ref 850–3900)
MCH: 24.2 pg — AB (ref 27.0–33.0)
MCHC: 32.8 g/dL (ref 32.0–36.0)
MCV: 73.9 fL — AB (ref 80.0–100.0)
MPV: 8.9 fL (ref 7.5–12.5)
Monocytes Relative: 6.7 %
Neutro Abs: 7560 cells/uL (ref 1500–7800)
Neutrophils Relative %: 73.4 %
Platelets: 467 10*3/uL — ABNORMAL HIGH (ref 140–400)
RBC: 4.79 10*6/uL (ref 3.80–5.10)
RDW: 14.9 % (ref 11.0–15.0)
TOTAL LYMPHOCYTE: 16.5 %
WBC: 10.3 10*3/uL (ref 3.8–10.8)
WBCMIX: 690 {cells}/uL (ref 200–950)

## 2017-03-30 LAB — B12 AND FOLATE PANEL
Folate: 14.9 ng/mL (ref >5.9)
Vitamin B-12: 190 pg/mL — ABNORMAL LOW (ref 211–911)

## 2017-03-30 MED ORDER — SERTRALINE HCL 50 MG PO TABS: 50.0000 mg | ORAL_TABLET | Freq: Every day | ORAL | 5 refills | Status: DC

## 2017-03-30 MED FILL — SERTRALINE HCL 50 MG TABLET: 50 | 30 days supply | Qty: 30 | Fill #0

## 2017-03-30 NOTE — Progress Notes (Signed)
Subjective:    Patient ID: [name], female    DOB: 1975-03-15, 42 y.o.   MRN: [id number]  HPI Patient is a 42 year old female who presents today for follow-up.   Depression/anxiety-last visit she described low mood and ongoing anxiety.  Was having issues with low motivation.  Advised her to increase her Effexor XR to 75 mg once daily. Reports good days and bad days.  Overall feeling better but finds herself extremely hungry.  She notes that she has a lot of stress with her mother who has "multisystem atrophy" she also has a son with special needs who has uncontrolled seizures.  She feels like she has been under chronic stress for the last 12 years.  Feels like this may be contributing to her overall health issues and mood. Wt Readings from Last 3 Encounters:  03/30/17 226 lb 12.8 oz (102.9 kg)  02/10/17 220 lb (99.8 kg)  01/23/17 221 lb 9.6 oz (100.5 kg)     Anemia-  Lab Results  Component Value Date   WBC 13.5 (H) 02/10/2017   HGB 11.4 (L) 02/10/2017   HCT 36.7 02/10/2017   MCV 78.8 02/10/2017   PLT 453 (H) 02/10/2017   Paresthesias- Since her last visit she also had a visit to the emergency department on 02/10/2017.  At this time she was experiencing bilateral tingling and numbness in the hands and feet.  ER physician thought that her symptoms were consistent with peripheral neuropathy.  She is scheduled to meet with neurology on March 13.  She reports that symptoms persist- especially in her lips which are now numb.   Review of Systems    see HPI  Past Medical History:  Diagnosis Date  . Anemia   . Anxiety   . Chicken pox   . Depression   . GERD (gastroesophageal reflux disease)    tums prn-with pregnancy only  . Headache(784.0)   . IBS (irritable bowel syndrome)   . Migraines   . Placenta previa 2012   with current pregnancy-type and cross 2 Units per protocol  . PONV (postoperative nausea and vomiting)   . Shortness of breath dyspnea    with this  pregnancy   . Syringomyelia, acquired [facility])      Social History   Socioeconomic History  . Marital status: Married    Spouse name: Not on file  . Number of children: Not on file  . Years of education: Not on file  . Highest education level: Not on file  Social Needs  . Financial resource strain: Not on file  . Food insecurity - worry: Not on file  . Food insecurity - inability: Not on file  . Transportation needs - medical: Not on file  . Transportation needs - non-medical: Not on file  Occupational History  . Not on file  Tobacco Use  . Smoking status: Never Smoker  . Smokeless tobacco: Never Used  Substance and Sexual Activity  . Alcohol use: No    Comment: rarely, but not while preg  . Drug use: No  . Sexual activity: Yes  Other Topics Concern  . Not on file  Social History Narrative   Patient lives at home with her husband [name]). 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   Daughter born 2012   Son 2006 (special needs)   Married    Past Surgical History:  Procedure Laterality Date  . APPENDECTOMY  2005  . arthroscopic knee  1990  .  CESAREAN SECTION  12/27/2010   Procedure: CESAREAN SECTION;  Surgeon: Shon Millet II;  Location: [facility];  Service: Gynecology;  Laterality: N/A;  . CESAREAN SECTION N/A 02/03/2015   Procedure: CESAREAN SECTION;  Surgeon: Everlene Farrier, MD;  Location: [facility];  Service: Obstetrics;  Laterality: N/A;  . left breast reduction  1996  . TUBAL LIGATION Bilateral 02/03/2015   Procedure: BILATERAL TUBAL LIGATION;  Surgeon: Everlene Farrier, MD;  Location: [facility];  Service: Obstetrics;  Laterality: Bilateral;  . WISDOM TOOTH EXTRACTION      Family History  Problem Relation Age of Onset  . High blood pressure Mother   . Arthritis Mother   . Hypertension Mother   . Other Mother        "Multisystem atrophy"  . Alcohol abuse Father   . Arthritis Father   . High blood pressure Brother     Allergies  Allergen  Reactions  . Topamax [Topiramate] Other (See Comments)    Pt states that she has trouble recalling words when she takes this medication.    Current Outpatient Medications on File Prior to Visit  Medication Sig Dispense Refill  . budesonide-formoterol (SYMBICORT) 80-4.5 MCG/ACT inhaler Inhale 2 puffs into the lungs 2 (two) times daily. 1 Inhaler 3  . ibuprofen (ADVIL,MOTRIN) 600 MG tablet Take 1 tablet (600 mg total) by mouth every 6 (six) hours as needed. 30 tablet 1  . loratadine (CLARITIN) 10 MG tablet Take 1 tablet (10 mg total) by mouth daily. 30 tablet 11  . Multiple Vitamin (MULTIVITAMIN) tablet Take 1 tablet by mouth daily.    Marland Kitchen venlafaxine XR (EFFEXOR XR) 75 MG 24 hr capsule Take 1 capsule (75 mg total) by mouth daily with breakfast. 30 capsule 1   No current facility-administered medications on file prior to visit.     BP 128/70 (BP Location: Right Arm, Patient Position: Sitting, Cuff Size: Large)   Pulse 78   Temp 98.2 F (36.8 C) (Oral)   Resp 16   Ht 5\' 8"  (1.727 m)   Wt 226 lb 12.8 oz (102.9 kg)   SpO2 99%   BMI 34.48 kg/m    Objective:   Physical Exam  Constitutional: She is oriented to person, place, and time. She appears well-developed and well-nourished.  Cardiovascular: Normal rate, regular rhythm and normal heart sounds.  No murmur heard. Pulmonary/Chest: Effort normal and breath sounds normal. No respiratory distress. She has no wheezes.  Musculoskeletal: She exhibits no edema.  Neurological: She is alert and oriented to person, place, and time. She displays no atrophy. No cranial nerve deficit. She exhibits normal muscle tone.  Reflex Scores:      Patellar reflexes are 2+ on the right side and 2+ on the left side. Skin: Skin is warm and dry.  Psychiatric: Her behavior is normal. Judgment and thought content normal.  Tearful upon discussion of her son's and mother's health          Assessment & Plan:  Paresthesias-we will check B12 and folate level.   She would also like to check vitamin D level. I have advised her to keep her upcoming appointment with neurology.  Depression/anxiety-this is improved.  She is concerned about increased hunger and she has had some weight gain.  I will decrease her Zoloft to 50 mg once daily and plan follow-up in 6 weeks.  If things are stable we can consider further taper off of Zoloft.  If deterioration in her symptoms may consider increasing Effexor.  Anemia-we will check follow-up CBC her iron studies were normal.  It will be interesting to see if her B12 and folate might be low.

## 2017-03-30 NOTE — Patient Instructions (Addendum)
Please complete lab work prior to leaving. Decrease zoloft from 100mg  to 50mg . Continue effexor. Please keep your upcoming appointment with neurology.

## 2017-03-30 NOTE — Telephone Encounter (Signed)
Please contact patient and let her know that her B12 level is low.  This very well could explain her numbness and tingling that she has been experiencing.  I would like for her to start B12 injections 1000 mcg IM once weekly for 4 weeks then once monthly.

## 2017-04-02 ENCOUNTER — Encounter: Payer: Self-pay | Admitting: Family

## 2017-04-02 NOTE — Telephone Encounter (Signed)
Patient notified and verbalized understanding. First injection scheduled for 04/04/17.

## 2017-04-03 LAB — VITAMIN D 1,25 DIHYDROXY
Vitamin D 1, 25 (OH)2 Total: 63 pg/mL (ref 18–72)
Vitamin D2 1, 25 (OH)2: <8 pg/mL
Vitamin D3 1, 25 (OH)2: 63 pg/mL

## 2017-04-04 ENCOUNTER — Ambulatory Visit (INDEPENDENT_AMBULATORY_CARE_PROVIDER_SITE_OTHER): Payer: 59

## 2017-04-04 DIAGNOSIS — E538 Deficiency of other specified B group vitamins: Secondary | ICD-10-CM | POA: Diagnosis not present

## 2017-04-04 MED ORDER — CYANOCOBALAMIN 1000 MCG/ML IJ SOLN
1000.0000 ug | Freq: Once | INTRAMUSCULAR | Status: AC
  Administered 2017-04-04: 1000 ug via INTRAMUSCULAR

## 2017-04-04 NOTE — Progress Notes (Signed)
Pt came in today for a B12 injection. Received in Right arm at 2563 without complication. Pt states she will schedule follow up accordingly .

## 2017-04-11 ENCOUNTER — Ambulatory Visit (INDEPENDENT_AMBULATORY_CARE_PROVIDER_SITE_OTHER): Payer: 59 | Admitting: *Deleted

## 2017-04-11 DIAGNOSIS — E538 Deficiency of other specified B group vitamins: Secondary | ICD-10-CM | POA: Diagnosis not present

## 2017-04-11 MED ORDER — CYANOCOBALAMIN 1000 MCG/ML IJ SOLN
1000.0000 ug | Freq: Once | INTRAMUSCULAR | Status: AC
  Administered 2017-04-11: 1000 ug via INTRAMUSCULAR

## 2017-04-11 NOTE — Patient Instructions (Signed)
Please return on 04/19/17 at 9:15am for your 3rd weekly B12 injection.

## 2017-04-11 NOTE — Progress Notes (Signed)
Pre visit review using our clinic review tool, if applicable. No additional management support is needed unless otherwise documented below in the visit note.  Pt here for 2nd weekly B12 injection 1029mcg/mL.  Notes that she had a little burning in her stomach after last injection and will notify us if it becomes more persistent or reoccurs.  Injection given in left deltoid and pt tolerated well. Appointment given for next injection on 04/19/17 at 9:15am.

## 2017-04-11 NOTE — Progress Notes (Signed)
Noted and agree. 

## 2017-04-17 ENCOUNTER — Encounter: Payer: Self-pay | Admitting: Family

## 2017-04-18 ENCOUNTER — Telehealth: Payer: Self-pay

## 2017-04-18 NOTE — Telephone Encounter (Signed)
Please advise 

## 2017-04-18 NOTE — Telephone Encounter (Signed)
ok 

## 2017-04-18 NOTE — Telephone Encounter (Signed)
Called pt and the first new pt appt I could get with Dr. Lorelei Pont is in July. Pt is not willing to wait this long and wants to know could she be seen sooner since she is not exactly a new pt to our office? She said she is willing to keep appt with Melissa on 05/11/17 but she will have to find a new pcp if she is not seen sooner than July. Please advise.

## 2017-04-18 NOTE — Telephone Encounter (Signed)
OK with me.

## 2017-04-18 NOTE — Telephone Encounter (Signed)
Okay to schedule transfer of care w/ Dr. Lorelei Pont.

## 2017-04-18 NOTE — Telephone Encounter (Signed)
Copied from [place] [id number]. Topic: Appointment Scheduling - Scheduling Inquiry for Clinic >> Apr 18, 2017  1:16 PM Synthia Innocent wrote: Patient is requesting to transfer from [facility] to Dr Lorelei Pont. Mother, Daphene Jaeger, was in seeing Dr Lorelei Pont, states she told patient she would accept her as a new patient. Please advise

## 2017-04-19 ENCOUNTER — Ambulatory Visit (INDEPENDENT_AMBULATORY_CARE_PROVIDER_SITE_OTHER): Payer: 59

## 2017-04-19 DIAGNOSIS — E538 Deficiency of other specified B group vitamins: Secondary | ICD-10-CM | POA: Diagnosis not present

## 2017-04-19 MED ORDER — CYANOCOBALAMIN 1000 MCG/ML IJ SOLN
1000.0000 ug | Freq: Once | INTRAMUSCULAR | Status: AC
  Administered 2017-04-19: 1000 ug via INTRAMUSCULAR

## 2017-04-19 NOTE — Telephone Encounter (Signed)
Okay to schedule sooner since she is not a true "new patient" she is a transfer.

## 2017-04-19 NOTE — Progress Notes (Signed)
Pre visit review using our clinic review tool, if applicable. No additional management support is needed unless otherwise documented below in the visit note.  Pt here today for weekly B12 injection #3. 73mL injected into L deltoid. Pt tolerated injection well.   Pt to return in 1 week for B12 injection #4.

## 2017-04-20 ENCOUNTER — Other Ambulatory Visit: Payer: Self-pay | Admitting: Family

## 2017-04-20 MED FILL — VENLAFAXINE HCL ER 75 MG CA: 75 | 30 days supply | Qty: 30 | Fill #0

## 2017-04-21 NOTE — Progress Notes (Signed)
Noted and agree.  Raye Wiens S O'Sullivan NP 

## 2017-04-27 ENCOUNTER — Ambulatory Visit (INDEPENDENT_AMBULATORY_CARE_PROVIDER_SITE_OTHER): Payer: 59 | Admitting: Emergency Medicine

## 2017-04-27 ENCOUNTER — Ambulatory Visit: Payer: 59

## 2017-04-27 DIAGNOSIS — E538 Deficiency of other specified B group vitamins: Secondary | ICD-10-CM

## 2017-04-27 MED ORDER — CYANOCOBALAMIN 1000 MCG/ML IJ SOLN
1000.0000 ug | Freq: Once | INTRAMUSCULAR | Status: AC
  Administered 2017-04-27: 1000 ug via INTRAMUSCULAR

## 2017-04-27 NOTE — Progress Notes (Signed)
Pre visit review using our clinic review tool, if applicable. No additional management support is needed unless otherwise documented below in the visit note.  Patient came in clinic for weekly B12 injection #4. IM injection given in right deltoid. Pt tolerated injection well.   Next appointment scheduled for April 5th at 9:45 am.

## 2017-05-02 ENCOUNTER — Ambulatory Visit: Payer: 59 | Admitting: Neurology

## 2017-05-02 ENCOUNTER — Other Ambulatory Visit: Payer: 59

## 2017-05-02 ENCOUNTER — Encounter: Payer: Self-pay | Admitting: Neurology

## 2017-05-02 VITALS — BP 120/80 | HR 79 | Ht 68.0 in | Wt 228.4 lb

## 2017-05-02 DIAGNOSIS — E538 Deficiency of other specified B group vitamins: Secondary | ICD-10-CM

## 2017-05-02 DIAGNOSIS — Q064 Hydromyelia: Secondary | ICD-10-CM | POA: Diagnosis not present

## 2017-05-02 DIAGNOSIS — R202 Paresthesia of skin: Secondary | ICD-10-CM

## 2017-05-02 NOTE — Progress Notes (Signed)
[facility] Clinic Note - Initial Visit   Date: 05/02/17  [name] MRN: [id number] DOB: 15-Jul-1975   Dear [name]:  Thank you for your kind referral of [name] for consultation of generalized paresthesias. Although her history is well known to you, please allow [name] to reiterate it for the purpose of our medical record. The patient was accompanied to the clinic by self.   History of Present Illness: [name] is a 42 y.o. right-handed Caucasian female with depression and thoracic hydromyelia presenting for evaluation of generalized paresthesias.    Starting in November 2018, she began having generalized muscle pain, burning pain in the soles of her feet, as well as tingling in her hands, and fatigue.  In December, she began having tingling over her lips and difficulty typing, so went to the ER who told her to follow-up with neurology.   She was found to have vitamin B12 deficiency and started weekly injections, which has helped her lip and hand paresthesias.  She has not appreciated any improvement in her overall fatigue.  She does not have any dietary restrictions.   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  She was evaluated by Dr. Moshe Cipro neurology at [facility] who suggested they could place a shunt, but did not pursue this as her symptoms were not severe.    Out-side paper records, electronic medical record, and images have been reviewed where available and summarized as:  Lab Results  Component Value Date   FOLATE 14.9 03/30/2017   Lab Results  Component Value Date   VITAMINB12 190 (L) 03/30/2017   Lab Results  Component Value Date   HGBA1C 5.5 06/27/2012   Lab Results  Component Value Date   TSH 1.84 01/23/2017     Past Medical History:  Diagnosis Date  . Anemia   . Anxiety   . Chicken pox   . Depression   . GERD  (gastroesophageal reflux disease)    tums prn-with pregnancy only  . Headache(784.0)   . IBS (irritable bowel syndrome)   . Migraines   . Placenta previa 2012   with current pregnancy-type and cross 2 Units per protocol  . PONV (postoperative nausea and vomiting)   . Shortness of breath dyspnea    with this pregnancy   . Syringomyelia, acquired [facility])     Past Surgical History:  Procedure Laterality Date  . APPENDECTOMY  2005  . arthroscopic knee  1990  . CESAREAN SECTION  12/27/2010   Procedure: CESAREAN SECTION;  Surgeon: Shon Millet II;  Location: [facility];  Service: Gynecology;  Laterality: N/A;  . CESAREAN SECTION N/A 02/03/2015   Procedure: CESAREAN SECTION;  Surgeon: Everlene Farrier, MD;  Location: [facility];  Service: Obstetrics;  Laterality: N/A;  . left breast reduction  1996  . TUBAL LIGATION Bilateral 02/03/2015   Procedure: BILATERAL TUBAL LIGATION;  Surgeon: Everlene Farrier, MD;  Location: [facility];  Service: Obstetrics;  Laterality: Bilateral;  . WISDOM TOOTH EXTRACTION       Medications:  Outpatient Encounter Medications as of 05/02/2017  Medication Sig  . budesonide-formoterol (SYMBICORT) 80-4.5 MCG/ACT inhaler Inhale 2 puffs into the lungs 2 (two) times daily.  Marland Kitchen ibuprofen (ADVIL,MOTRIN) 600 MG tablet Take 1 tablet (600 mg total) by mouth every 6 (six) hours as needed.  . loratadine (CLARITIN) 10 MG tablet Take 1 tablet (10 mg total) by mouth daily.  Marland Kitchen  Multiple Vitamin (MULTIVITAMIN) tablet Take 1 tablet by mouth daily.  . sertraline (ZOLOFT) 50 MG tablet Take 1 tablet (50 mg total) by mouth daily.  Marland Kitchen venlafaxine XR (EFFEXOR-XR) 75 MG 24 hr capsule TAKE 1 CAPSULE (75 MG TOTAL) BY MOUTH DAILY WITH BREAKFAST.   No facility-administered encounter medications on file as of 05/02/2017.      Allergies:  Allergies  Allergen Reactions  . Topamax [Topiramate] Other (See Comments)    Pt states that she has trouble recalling words when she takes this medication.    Family  History: Family History  Problem Relation Age of Onset  . High blood pressure Mother   . Arthritis Mother   . Hypertension Mother   . Other Mother        "Multisystem atrophy"  . Alcohol abuse Father   . Arthritis Father   . High blood pressure Brother     Social History: Social History   Tobacco Use  . Smoking status: Never Smoker  . Smokeless tobacco: Never Used  Substance Use Topics  . Alcohol use: No    Comment: rarely, but not while preg  . Drug use: No   Social History   Social History Narrative   Patient lives at home with her husband [name]). 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   Daughter born 2012   Son 2006 (special needs)- has uncontrolled seizures   Married   Therapist, sports    Review of Systems:  CONSTITUTIONAL: No fevers, chills, night sweats, or weight loss.   EYES: No visual changes or eye pain ENT: No hearing changes.  No history of nose bleeds.   RESPIRATORY: No cough, wheezing and shortness of breath.   CARDIOVASCULAR: Negative for chest pain, and palpitations.   GI: Negative for abdominal discomfort, blood in stools or black stools.  No recent change in bowel habits.   GU:  No history of incontinence.   MUSCLOSKELETAL: No history of joint pain or swelling.  No myalgias.   SKIN: Negative for lesions, rash, and itching.   HEMATOLOGY/ONCOLOGY: Negative for prolonged bleeding, bruising easily, and swollen nodes.  No history of cancer.   ENDOCRINE: Negative for cold or heat intolerance, polydipsia or goiter.   PSYCH:  No depression or anxiety symptoms.   NEURO: As Above.   Vital Signs:  BP 120/80   Pulse 79   Ht 5\' 8"  (1.727 m)   Wt 228 lb 6 oz (103.6 kg)   SpO2 98%   BMI 34.72 kg/m    General Medical Exam:   General:  Well appearing, comfortable.   Eyes/ENT: see cranial nerve examination.   Neck: No masses appreciated.  Full range of motion without tenderness.  No carotid bruits. Respiratory:  Clear to auscultation, good  air entry bilaterally.   Cardiac:  Regular rate and rhythm, no murmur.   Extremities:  No deformities, edema, or skin discoloration.  Skin:  No rashes or lesions.  Neurological Exam: MENTAL STATUS including orientation to time, place, person, recent and remote memory, attention span and concentration, language, and fund of knowledge is normal.  Speech is not dysarthric.  CRANIAL NERVES: II:  No visual field defects.  Unremarkable fundi.   III-IV-VI: Pupils equal round and reactive to light.  Normal conjugate, extra-ocular eye movements in all directions of gaze.  No nystagmus.  No ptosis.   V:  Normal facial sensation   VII:  Normal facial symmetry and movements.    VIII:  Normal hearing  and vestibular function.   IX-X:  Normal palatal movement.   XI:  Normal shoulder shrug and head rotation.   XII:  Normal tongue strength and range of motion, no deviation or fasciculation.  MOTOR:  No atrophy, fasciculations or abnormal movements.  No pronator drift.  Tone is normal.    Right Upper Extremity:    Left Upper Extremity:    Deltoid  5/5   Deltoid  5/5   Biceps  5/5   Biceps  5/5   Triceps  5/5   Triceps  5/5   Wrist extensors  5/5   Wrist extensors  5/5   Wrist flexors  5/5   Wrist flexors  5/5   Finger extensors  5/5   Finger extensors  5/5   Finger flexors  5/5   Finger flexors  5/5   Dorsal interossei  5/5   Dorsal interossei  5/5   Abductor pollicis  5/5   Abductor pollicis  5/5   Tone (Ashworth scale)  0  Tone (Ashworth scale)  0   Right Lower Extremity:    Left Lower Extremity:    Hip flexors  5/5   Hip flexors  5/5   Hip extensors  5/5   Hip extensors  5/5   Knee flexors  5/5   Knee flexors  5/5   Knee extensors  5/5   Knee extensors  5/5   Dorsiflexors  5/5   Dorsiflexors  5/5   Plantarflexors  5/5   Plantarflexors  5/5   Toe extensors  5/5   Toe extensors  5/5   Toe flexors  5/5   Toe flexors  5/5   Tone (Ashworth scale)  0  Tone (Ashworth scale)  0   MSRs:  Right                                                                  Left brachioradialis 2+  brachioradialis 2+  biceps 2+  biceps 2+  triceps 2+  triceps 2+  patellar 3+  patellar 3++  ankle jerk 2+  ankle jerk 2+  Hoffman no  Hoffman no  plantar response down  plantar response down   SENSORY:  Vibration, temperature, and pin prick is reduced distal to ankles bilaterally and over the forearms and hands.  Romberg's sign absent.   COORDINATION/GAIT: Normal finger-to- nose-finger.  Intact rapid alternating movements bilaterally.  Able to rise from a chair without using arms.  Gait narrow based and stable. Tandem and stressed gait intact.    IMPRESSION: Generalized paresthesias, most likely due to vitamin B12 deficiency.  Because she also has stocking-glove distribution of sensory loss on exam, I will order NCS/EMG of the right arm and leg to better characterize the nature of her symptoms.  Recommend that she continue weekly vitamin B12 injections for one more month, then transition to monthly.  Check vitamin B1, intrinsic factor antibodies, copper.    Known thoracic hydromyelia, doubt this is contributing to her current symptomology.  If she develops myelopathic findings, repeat imaging can be ordered.   Further recommendations pending results.   Thank you for allowing me to participate in patient's care.  If I can answer any additional questions, I would be pleased to do so.  Sincerely,    Aeryn Medici K. Posey Pronto, DO

## 2017-05-02 NOTE — Progress Notes (Addendum)
[facility] [address], [place], [place] [address] [phone]  Date:  05/03/2017   Name:  [name]   DOB:  Feb 11, 1976   MRN:  [id number]  PCP:  Darreld Mclean, MD    Chief Complaint: Establish Care (Transfer from [facility]. )   History of Present Illness:  [name] is a 42 y.o. very pleasant female patient who presents with the following:  Seeing me as a new patient today although she is not new to the practice  History of b12 def, depression, IBS, migraine  She had spoken to me about various concerns about her health- see ER note from January: [name] is a 42 y.o. female resents to the emergency department for evaluation of tingling/numbness sensation to the bottom of both feet along with similar sensation in the hands.  She describes intermittent burning pain in these areas but none currently.  She feels like she is very fatigued and running on very low energy all the time.  This most recent episode started after Thanksgiving.  Patient states she has been followed by neurology in the past but felt like she was brushed off.  She spoke with her primary care physician regarding the symptoms and was referred to a neurologist but is not able to be seen until March of next year.  She denies any unilateral weakness or numbness.  No sudden severe headaches.  No vision changes or difficulty swallowing.  No difficulty walking.  She was actually just seen by Narda Amber yesterday who thinks her sx are likely from her  b12 def. She is doing EMG of the right arm and advised regarding her B12 supplementation.  Some other labs are pending from her office including B1, intrinsic factor ab and copper  Her last B12 in February was 190  Here today with her 2yo son [name]  She is seeing improvement in her low b12 sx  They will do weekly B12 shots- she is not sure how long this will be the plan and when they  will change to monthly  She notes that she "continues to have just horrible fatigue" Her numbness and tingling are getting better with her B12 treatment However she feels like she cannot sense temperatures like she should be able to- hard for her to determine the temperature of the bath water.    She also notes that she is having "low grade fevers" of 99.9 to 100.2 at home over the last month or two She notes this every day over the last month.  We have never seen an elevated temperature here   She does not feel like her fatigue is getting better with her B12 treatment   Lab Results  Component Value Date   TSH 1.84 01/23/2017   She is s/p BTL and her husband had a vasectomy She is minimally anemic- hg of 11.6 at last visit,  MCV on the low side, most recent ferritin low at 12.8  She does have menses on a regular basis.  She does not really feel like she bleeds heavily She notes that she is under "high levels of stress for 12 years with my oldest son."  He has uncontrolled epilepsy.  This is hard, esp as he is getting older and wants to be more independent.    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   Her husband is supportive.  Her oldest has some support at home However she admits she is under a lot of stress and may have some depression.  No SI however  She does feel like she suffers from anxiety a good bit  Her Zoloft has been decreased form 100 to 50, added effexor in January  She is just on 75 mg of effexor now  She does feel like adding the effexor has helped her some . Patient Active Problem List   Diagnosis Date Noted  . B12 deficiency 03/30/2017  . Asthma 12/08/2016  . S/P cesarean section 02/03/2015  . Low back pain 04/24/2014  . Chest pain 01/01/2014  . Scalp  lesion 01/01/2014  . Wheezing 11/19/2013  . Allergic rhinitis 11/11/2013  . Sinusitis, acute maxillary 11/11/2013  . Daytime somnolence 09/04/2013  . Abdominal pain, other specified site 08/21/2013  . Syringomyelia ([place]) 05/09/2013  . Mass 05/09/2013  . Paresthesia 07/19/2012  . Depression 07/19/2012  . Sensory disturbance 06/27/2012  . Leg weakness 06/27/2012  . Leukocytosis 06/27/2012  . Microcytic anemia 06/27/2012    Past Medical History:  Diagnosis Date  . Anemia   . Anxiety   . Chicken pox   . Depression   . GERD (gastroesophageal reflux disease)    tums prn-with pregnancy only  . Headache(784.0)   . IBS (irritable bowel syndrome)   . Migraines   . Placenta previa 2012   with current pregnancy-type and cross 2 Units per protocol  . PONV (postoperative nausea and vomiting)   . Shortness of breath dyspnea    with this pregnancy   . Syringomyelia, acquired [facility])     Past Surgical History:  Procedure Laterality Date  . APPENDECTOMY  2005  . arthroscopic knee  1990  . CESAREAN SECTION  12/27/2010   Procedure: CESAREAN SECTION;  Surgeon: Shon Millet II;  Location: [facility];  Service: Gynecology;  Laterality: N/A;  . CESAREAN SECTION N/A 02/03/2015   Procedure: CESAREAN SECTION;  Surgeon: Everlene Farrier, MD;  Location: [facility];  Service: Obstetrics;  Laterality: N/A;  . left breast reduction  1996  . TUBAL LIGATION Bilateral 02/03/2015   Procedure: BILATERAL TUBAL LIGATION;  Surgeon: Everlene Farrier, MD;  Location: [facility];  Service: Obstetrics;  Laterality: Bilateral;  . WISDOM TOOTH EXTRACTION      Social History   Tobacco Use  . Smoking status: Never Smoker  . Smokeless tobacco: Never Used  Substance Use Topics  . Alcohol use: No    Comment: rarely, but not while preg  . Drug use: No    Family History  Problem Relation Age of Onset  . High blood pressure Mother   . Arthritis Mother   . Hypertension Mother   . Other Mother        "Multisystem atrophy"   . Alcohol abuse Father   . Arthritis Father   . High blood pressure Brother     Allergies  Allergen Reactions  . Topamax [Topiramate] Other (See Comments)    Pt states that she has trouble recalling words when she takes this medication.    Medication list has been reviewed and updated.  Current Outpatient Medications on File Prior to Visit  Medication Sig Dispense Refill  . budesonide-formoterol (SYMBICORT) 80-4.5 MCG/ACT inhaler Inhale 2  puffs into the lungs 2 (two) times daily. 1 Inhaler 3  . ibuprofen (ADVIL,MOTRIN) 600 MG tablet Take 1 tablet (600 mg total) by mouth every 6 (six) hours as needed. 30 tablet 1  . loratadine (CLARITIN) 10 MG tablet Take 1 tablet (10 mg total) by mouth daily. 30 tablet 11  . Multiple Vitamin (MULTIVITAMIN) tablet Take 1 tablet by mouth daily.    . sertraline (ZOLOFT) 50 MG tablet Take 1 tablet (50 mg total) by mouth daily. 30 tablet 5  . venlafaxine XR (EFFEXOR-XR) 75 MG 24 hr capsule TAKE 1 CAPSULE (75 MG TOTAL) BY MOUTH DAILY WITH BREAKFAST. 30 capsule 2   No current facility-administered medications on file prior to visit.     Review of Systems:  As per HPI- otherwise negative.   Physical Examination: Vitals:   05/03/17 1036  BP: 124/82  Pulse: 73  Temp: 97.6 F (36.4 C)  SpO2: 98%   Vitals:   05/03/17 1036  Weight: 230 lb 12.8 oz (104.7 kg)  Height: 5\' 8"  (1.727 m)   Body mass index is 35.09 kg/m. Ideal Body Weight: Weight in (lb) to have BMI = 25: 164.1  GEN: WDWN, NAD, Non-toxic, A & O x 3, obese, looks well. Is a bit tearful during visit today HEENT: Atraumatic, Normocephalic. Neck supple. No masses, No LAD.  Bilateral TM wnl, oropharynx normal.  PEERL,EOMI.   Ears and Nose: No external deformity. CV: RRR, No M/G/R. No JVD. No thrill. No extra heart sounds. PULM: CTA B, no wheezes, crackles, rhonchi. No retractions. No resp. distress. No accessory muscle use. ABD: S, NT, ND EXTR: No c/c/e NEURO Normal gait.  PSYCH:  Normally interactive. Conversant. Not depressed or anxious appearing.  Calm demeanor.    Assessment and Plan: Low grade fever - Plan: CBC with Differential/Platelet, Pathologist smear review, HIV antibody, QuantiFERON-TB Gold Plus  Situational stress  B12 deficiency  Microcytic anemia - Plan: CBC with Differential/Platelet, Ferritin  Medication monitoring encounter - Plan: Basic metabolic panel  Following up on some persistent sx today Will do eval for low grade fevers as above Known b12 def- ?low iron as well Will taper off zoloft and increase effexor once off zoloft for 48 hours.  May add wellbutrin later on  Will plan further follow- up pending labs.   Signed Lamar Blinks, MD  Received her labs 3/16, message to pt:  Metabolic profile is normal HIV and TB screening negative as expected Your iron is on the low side of normal, and your blood counts do suggest low iron may be an issue for you.  Please start on an OTC iron supplement as directed on the package.  Doing some of your cooking in a cast iron skillet is another good way to increase your iron intake.  I would like to repeat your iron level in 8 weeks  Results for orders placed or performed in visit on 05/03/17  CBC with Differential/Platelet  Result Value Ref Range   WBC 11.7 (H) 4.0 - 10.5 K/uL   RBC 4.67 3.87 - 5.11 Mil/uL   Hemoglobin 11.1 (L) 12.0 - 15.0 g/dL   HCT 35.5 (L) 36.0 - 46.0 %   MCV 76.1 (L) 78.0 - 100.0 fl   MCHC 31.1 30.0 - 36.0 g/dL   RDW 15.9 (H) 11.5 - 15.5 %   Platelets 469.0 (H) 150.0 - 400.0 K/uL   Neutrophils Relative % 66.4 43.0 - 77.0 %   Lymphocytes Relative 23.6 12.0 - 46.0 %   Monocytes  Relative 6.3 3.0 - 12.0 %   Eosinophils Relative 2.8 0.0 - 5.0 %   Basophils Relative 0.9 0.0 - 3.0 %   Neutro Abs 7.8 (H) 1.4 - 7.7 K/uL   Lymphs Abs 2.8 0.7 - 4.0 K/uL   Monocytes Absolute 0.7 0.1 - 1.0 K/uL   Eosinophils Absolute 0.3 0.0 - 0.7 K/uL   Basophils Absolute 0.1 0.0 - 0.1 K/uL   Pathologist smear review  Result Value Ref Range   Path Review    Ferritin  Result Value Ref Range   Ferritin 15.3 10.0 - 291.0 ng/mL  HIV antibody  Result Value Ref Range   HIV 1&2 Ab, 4th Generation NON-REACTIVE NON-REACTI  QuantiFERON-TB Gold Plus  Result Value Ref Range   QuantiFERON-TB Gold Plus NEGATIVE NEGATIVE   NIL 0.02 IU/mL   Mitogen-NIL >10.00 IU/mL   TB1-NIL 0.00 IU/mL   TB2-NIL 0.00 IU/mL  Basic metabolic panel  Result Value Ref Range   Sodium 135 135 - 145 mEq/L   Potassium 3.9 3.5 - 5.1 mEq/L   Chloride 103 96 - 112 mEq/L   CO2 27 19 - 32 mEq/L   Glucose, Bld 95 70 - 99 mg/dL   BUN 9 6 - 23 mg/dL   Creatinine, Ser 0.67 0.40 - 1.20 mg/dL   Calcium 9.1 8.4 - 10.5 mg/dL   GFR 103.49 >60.00 mL/min   Path Review   Comment: Leukocytosis. Myeloid population consists predominantly of mature  segmented neutrophils with reactive changes.  No immature cells are identified. RBCs appear to be microcytic and  hypochromic on smear  review. Suggest evaluation for iron deficiency, if  clinically indicated.  Microcytosis and concurrent relative erythrocytosis  is suggestive of thalassemia, in the absence of iron  deficiency.  Elliptocytes, Target Cells and The overall findings in this smear are  consistent with  a reactive thrombocytosis. Clinical correlation is  recommended.

## 2017-05-02 NOTE — Patient Instructions (Signed)
Check labs  ELECTROMYOGRAM AND NERVE CONDUCTION STUDIES (EMG/NCS) INSTRUCTIONS  How to Prepare The neurologist conducting the EMG will need to know if you have certain medical conditions. Tell the neurologist and other EMG lab personnel if you: Have a pacemaker or any other electrical medical device Take blood-thinning medications Have hemophilia, a blood-clotting disorder that causes prolonged bleeding Bathing Take a shower or bath shortly before your exam in order to remove oils from your skin. Don't apply lotions or creams before the exam.  What to Expect You'll likely be asked to change into a hospital gown for the procedure and lie down on an examination table. The following explanations can help you understand what will happen during the exam.  Electrodes. The neurologist or a technician places surface electrodes at various locations on your skin depending on where you're experiencing symptoms. Or the neurologist may insert needle electrodes at different sites depending on your symptoms.  Sensations. The electrodes will at times transmit a tiny electrical current that you may feel as a twinge or spasm. The needle electrode may cause discomfort or pain that usually ends shortly after the needle is removed. If you are concerned about discomfort or pain, you may want to talk to the neurologist about taking a short break during the exam.  Instructions. During the needle EMG, the neurologist will assess whether there is any spontaneous electrical activity when the muscle is at rest - activity that isn't present in healthy muscle tissue - and the degree of activity when you slightly contract the muscle.  He or she will give you instructions on resting and contracting a muscle at appropriate times. Depending on what muscles and nerves the neurologist is examining, he or she may ask you to change positions during the exam.  After your EMG You may experience some temporary, minor bruising where the  needle electrode was inserted into your muscle. This bruising should fade within several days. If it persists, contact your primary care doctor.   

## 2017-05-03 ENCOUNTER — Ambulatory Visit: Payer: 59 | Admitting: Family Medicine

## 2017-05-03 ENCOUNTER — Encounter: Payer: Self-pay | Admitting: Family Medicine

## 2017-05-03 VITALS — BP 124/82 | HR 73 | Temp 97.6°F | Ht 68.0 in | Wt 230.8 lb

## 2017-05-03 DIAGNOSIS — F439 Reaction to severe stress, unspecified: Secondary | ICD-10-CM

## 2017-05-03 DIAGNOSIS — Z5181 Encounter for therapeutic drug level monitoring: Secondary | ICD-10-CM | POA: Diagnosis not present

## 2017-05-03 DIAGNOSIS — E538 Deficiency of other specified B group vitamins: Secondary | ICD-10-CM | POA: Diagnosis not present

## 2017-05-03 DIAGNOSIS — D509 Iron deficiency anemia, unspecified: Secondary | ICD-10-CM | POA: Diagnosis not present

## 2017-05-03 DIAGNOSIS — R509 Fever, unspecified: Secondary | ICD-10-CM

## 2017-05-03 LAB — BASIC METABOLIC PANEL
BUN: 9 mg/dL (ref 6–23)
CALCIUM: 9.1 mg/dL (ref 8.4–10.5)
CO2: 27 mEq/L (ref 19–32)
Chloride: 103 mEq/L (ref 96–112)
Creatinine, Ser: 0.67 mg/dL (ref 0.40–1.20)
GFR: 103.49 mL/min (ref >60.00)
Glucose, Bld: 95 mg/dL (ref 70–99)
POTASSIUM: 3.9 meq/L (ref 3.5–5.1)
SODIUM: 135 meq/L (ref 135–145)

## 2017-05-03 LAB — CBC WITH DIFFERENTIAL/PLATELET
BASOS ABS: 0.1 10*3/uL (ref 0.0–0.1)
BASOS PCT: 0.9 % (ref 0.0–3.0)
EOS ABS: 0.3 10*3/uL (ref 0.0–0.7)
Eosinophils Relative: 2.8 % (ref 0.0–5.0)
HCT: 35.5 % — ABNORMAL LOW (ref 36.0–46.0)
Hemoglobin: 11.1 g/dL — ABNORMAL LOW (ref 12.0–15.0)
LYMPHS ABS: 2.8 10*3/uL (ref 0.7–4.0)
Lymphocytes Relative: 23.6 % (ref 12.0–46.0)
MCHC: 31.1 g/dL (ref 30.0–36.0)
MCV: 76.1 fl — ABNORMAL LOW (ref 78.0–100.0)
Monocytes Absolute: 0.7 10*3/uL (ref 0.1–1.0)
Monocytes Relative: 6.3 % (ref 3.0–12.0)
NEUTROS ABS: 7.8 10*3/uL — AB (ref 1.4–7.7)
NEUTROS PCT: 66.4 % (ref 43.0–77.0)
PLATELETS: 469 10*3/uL — AB (ref 150.0–400.0)
RBC: 4.67 Mil/uL (ref 3.87–5.11)
RDW: 15.9 % — AB (ref 11.5–15.5)
WBC: 11.7 10*3/uL — ABNORMAL HIGH (ref 4.0–10.5)

## 2017-05-03 LAB — FERRITIN: Ferritin: 15.3 ng/mL (ref 10.0–291.0)

## 2017-05-03 NOTE — Patient Instructions (Signed)
It was nice to see you today- I am sorry that you are having such a hard time! It sounds like you are under a lot of stress   Please decrease your Zoloft to 1/2 tablet (25 mg) for one week, then stop taking it.  When you come off the Zoloft, then increase your Effexor to 2 pills daily  We can change you to the 150 mg effexor when you are due for a refill next We may eventually add some wellbutrin to your regimen as well  I am going to look for any potential cause of your low grade fevers today, and will also check your iron Will be in touch with you asap

## 2017-05-05 ENCOUNTER — Encounter: Payer: Self-pay | Admitting: Family Medicine

## 2017-05-05 LAB — QUANTIFERON-TB GOLD PLUS
NIL: 0.02 [IU]/mL
QuantiFERON-TB Gold Plus: NEGATIVE
TB1-NIL: 0 [IU]/mL
TB2-NIL: 0 [IU]/mL

## 2017-05-05 LAB — PATHOLOGIST SMEAR REVIEW

## 2017-05-05 LAB — HIV ANTIBODY (ROUTINE TESTING W REFLEX): HIV 1&2 Ab, 4th Generation: NONREACTIVE

## 2017-05-06 LAB — COPPER, SERUM: Copper: 147 ug/dL (ref 70–175)

## 2017-05-06 LAB — VITAMIN B1: Vitamin B1 (Thiamine): 7 nmol/L — ABNORMAL LOW (ref 8–30)

## 2017-05-06 LAB — INTRINSIC FACTOR ANTIBODIES: Intrinsic Factor: NEGATIVE

## 2017-05-08 ENCOUNTER — Telehealth: Payer: Self-pay | Admitting: *Deleted

## 2017-05-08 NOTE — Telephone Encounter (Signed)
Patient given results and instructions.   

## 2017-05-08 NOTE — Telephone Encounter (Signed)
-----   Message from Alda Berthold, DO sent at 05/07/2017  8:05 AM EDT ----- Please inform patient that her vitamin B1 is also low.  Recommend starting vitamin B1 (thiamine) 100mg  daily.  The remaining labs look good. Thanks.

## 2017-05-10 ENCOUNTER — Ambulatory Visit (INDEPENDENT_AMBULATORY_CARE_PROVIDER_SITE_OTHER): Payer: 59 | Admitting: Neurology

## 2017-05-10 ENCOUNTER — Encounter: Payer: Self-pay | Admitting: Family Medicine

## 2017-05-10 DIAGNOSIS — R202 Paresthesia of skin: Secondary | ICD-10-CM | POA: Diagnosis not present

## 2017-05-10 DIAGNOSIS — E538 Deficiency of other specified B group vitamins: Secondary | ICD-10-CM

## 2017-05-10 DIAGNOSIS — D649 Anemia, unspecified: Secondary | ICD-10-CM

## 2017-05-10 NOTE — Procedures (Signed)
[facility]  [place], [address]  [place],  [address] Tel: [phone] Fax:  [phone] Test Date:  05/10/2017  Patient: [name] DOB: 1975/11/27 Physician: Narda Amber, DO  Sex: Female Height: 5\' 8"  Ref Phys: Narda Amber, DO  ID#: [id number] Temp: 36.1C Technician:    Patient Complaints: This is a 42 year old female referred for evaluation of generalized paresthesias of the arms and legs.  NCV & EMG Findings: Extensive electrodiagnostic testing of the right upper and lower extremity shows:  1. All sensory responses including the right median, ulnar, mixed palmer, sural, and superficial peroneal sensory responses are within normal limits. 2. All motor responses including the right median, ulnar, peroneal, and tibial nerves are within normal limits. 3. Right tibial H reflex study is within normal limits. 4. There is no evidence of active or chronic motor axon loss changes affecting any of the tested muscles. Motor unit configuration and recruitment pattern is within normal limits.  Impression: This is a normal study of the right side. In particular, there is no evidence of a sensorimotor polyneuropathy or cervical/lumbosacral radiculopathy.   ___________________________ Narda Amber, DO    Nerve Conduction Studies Anti Sensory Summary Table   Site NR Peak (ms) Norm Peak (ms) P-T Amp (V) Norm P-T Amp  Right Median Anti Sensory (2nd Digit)  36.1C  Wrist    2.5 <3.4 62.6 >20  Right Sup Peroneal Anti Sensory (Ant Lat Mall)  36.1C  12 cm    2.5 <4.5 14.8 >5  Right Sural Anti Sensory (Lat Mall)  36.1C  Calf    2.6 <4.5 23.4 >5  Right Ulnar Anti Sensory (5th Digit)  36.1C  Wrist    2.0 <3.1 51.0 >12   Motor Summary Table   Site NR Onset (ms) Norm Onset (ms) O-P Amp (mV) Norm O-P Amp Site1 Site2 Delta-0 (ms) Dist (cm) Vel (m/s) Norm Vel (m/s)  Right Median Motor (Abd Poll Brev)  36.1C  Wrist    2.5 <3.9 11.4 >6 Elbow Wrist 4.2 28.0 67  >50  Elbow    6.7  9.7         Right Peroneal Motor (Ext Dig Brev)  36.1C  Ankle    2.8 <5.5 10.3 >3 B Fib Ankle 7.6 38.0 50 >40  B Fib    10.4  9.2  Poplt B Fib 1.1 9.0 82 >40  Poplt    11.5  9.1         Right Tibial Motor (Abd Hall Brev)  36.1C  Ankle    2.7 <6.0 17.4 >8 Knee Ankle 8.2 39.0 48 >40  Knee    10.9  13.4         Right Ulnar Motor (Abd Dig Minimi)  36.1C  Wrist    1.8 <3.1 9.9 >7 B Elbow Wrist 3.6 24.0 67 >50  B Elbow    5.4  9.2  A Elbow B Elbow 1.3 10.0 77 >50  A Elbow    6.7  9.0          Comparison Summary Table   Site NR Peak (ms) Norm Peak (ms) P-T Amp (V) Site1 Site2 Delta-P (ms) Norm Delta (ms)  Right Median/Ulnar Palm Comparison (Wrist - 8cm)  36.1C  Median Palm    1.5 <2.2 84.9 Median Palm Ulnar Palm 0.2   Ulnar Palm    1.3 <2.2 28.9       H Reflex Studies   NR H-Lat (ms) Lat Norm (ms) L-R H-Lat (ms)  Right Tibial (Gastroc)  36.1C     31.84 <35    EMG   Side Muscle Ins Act Fibs Psw Fasc Number Recrt Dur Dur. Amp Amp. Poly Poly. Comment  Right AntTibialis Nml Nml Nml Nml Nml Nml Nml Nml Nml Nml Nml Nml N/A  Right Gastroc Nml Nml Nml Nml Nml Nml Nml Nml Nml Nml Nml Nml N/A  Right Flex Dig Long Nml Nml Nml Nml Nml Nml Nml Nml Nml Nml Nml Nml N/A  Right RectFemoris Nml Nml Nml Nml Nml Nml Nml Nml Nml Nml Nml Nml N/A  Right GluteusMed Nml Nml Nml Nml Nml Nml Nml Nml Nml Nml Nml Nml N/A  Right 1stDorInt Nml Nml Nml Nml Nml Nml Nml Nml Nml Nml Nml Nml N/A  Right PronatorTeres Nml Nml Nml Nml Nml Nml Nml Nml Nml Nml Nml Nml N/A  Right Biceps Nml Nml Nml Nml Nml Nml Nml Nml Nml Nml Nml Nml N/A  Right Deltoid Nml Nml Nml Nml Nml Nml Nml Nml Nml Nml Nml Nml N/A  Right Triceps Nml Nml Nml Nml Nml Nml Nml Nml Nml Nml Nml Nml N/A      Waveforms:

## 2017-05-11 ENCOUNTER — Ambulatory Visit: Payer: 59 | Admitting: Family

## 2017-05-11 NOTE — Telephone Encounter (Signed)
Copied from [place] [id number]. Topic: Appointment Scheduling - Scheduling Inquiry for Clinic >> May 11, 2017  1:35 PM Burnis Medin, NT wrote: Reason for CRM: Patient called and said the doctor told her she is supposed to start B12 injections weekly. Patient wanted to  know if she had to wait til 4/5 or if she can be scheduled immediately. Pt would like a call back

## 2017-05-21 ENCOUNTER — Encounter: Payer: Self-pay | Admitting: Nurse Practitioner

## 2017-05-21 ENCOUNTER — Ambulatory Visit (INDEPENDENT_AMBULATORY_CARE_PROVIDER_SITE_OTHER): Payer: Self-pay | Admitting: Nurse Practitioner

## 2017-05-21 VITALS — BP 120/80 | HR 88 | Temp 98.4°F | Resp 16 | Wt 227.2 lb

## 2017-05-21 DIAGNOSIS — J029 Acute pharyngitis, unspecified: Secondary | ICD-10-CM

## 2017-05-21 LAB — POCT RAPID STREP A (OFFICE): Rapid Strep A Screen: NEGATIVE

## 2017-05-21 MED ORDER — AMOXICILLIN 875 MG PO TABS: 875.0000 mg | ORAL_TABLET | Freq: Two times a day (BID) | ORAL | 0 refills | Status: AC

## 2017-05-21 MED ORDER — BENZONATATE 100 MG PO CAPS: 100.0000 mg | ORAL_CAPSULE | Freq: Three times a day (TID) | ORAL | 0 refills | Status: AC | PRN

## 2017-05-21 MED ORDER — MAGIC MOUTHWASH W/LIDOCAINE: 5.0000 mL | Freq: Three times a day (TID) | ORAL | 0 refills | Status: AC | PRN

## 2017-05-21 MED FILL — BENZONATATE 100 MG CAPSULE: 100 | 10 days supply | Qty: 30 | Fill #0

## 2017-05-21 MED FILL — AMOXICILLIN 875 MG TABLET: 875 | 10 days supply | Qty: 20 | Fill #0

## 2017-05-21 MED FILL — MAGIC MOUTHWASH W/LIDO 1:1: 8 days supply | Qty: 120 | Fill #0

## 2017-05-21 NOTE — Progress Notes (Signed)
Subjective:     [name] is a 42 y.o. female who presents for evaluation of sore throat. Associated symptoms include chills, hoarseness, productive cough, sore throat, swollen glands and wheezing. Onset of symptoms was 1 day ago, and have been gradually worsening since that time. She is drinking moderate amounts of fluids. She has not had a recent close exposure to someone with proven streptococcal pharyngitis.  The following portions of the patient's history were reviewed and updated as appropriate: allergies, current medications and past medical history.  Review of Systems Constitutional: positive for anorexia, chills and fatigue, negative for fevers, malaise and sweats Eyes: negative Ears, nose, mouth, throat, and face: positive for hoarseness, nasal congestion and sore throat, negative for ear drainage and earaches Respiratory: positive for cough, sputum and wheezing, negative for asthma, chronic bronchitis, dyspnea on exertion, pneumonia and stridor Cardiovascular: negative Gastrointestinal: positive for decreased appetite, negative for abdominal pain, change in bowel habits, constipation, diarrhea, nausea and vomiting Neurological: negative    Objective:    BP 120/80 (BP Location: Right Arm, Patient Position: Sitting, Cuff Size: Normal)   Pulse 88   Temp 98.4 F (36.9 C) (Oral)   Resp 16   Wt 227 lb 3.2 oz (103.1 kg)   SpO2 98%   BMI 34.55 kg/m  General appearance: alert, cooperative and mild distress Head: Normocephalic, without obvious abnormality, atraumatic Eyes: conjunctivae/corneas clear. PERRL, EOM's intact. Fundi benign. Ears: normal TM's and external ear canals both ears Nose: no discharge, turbinates swollen, inflamed, no sinus tenderness Throat: abnormal findings: exudates present and moderate oropharyngeal erythema Lungs: clear to auscultation bilaterally Heart: regular rate and rhythm, S1, S2 normal, no murmur, click, rub or gallop Pulses: 2+ and  symmetric Skin: Skin color, texture, turgor normal. No rashes or lesions Lymph nodes: cervical lymphadenopathy bilaterally Neurologic: Grossly normal  Laboratory Strep test done. Results:negative.    Assessment:   Acute Pharyngitis   Plan:    Patient placed on antibiotics. Use of OTC analgesics recommended as well as salt water gargles. Patient advised of the risk of peritonsillar abscess formation. Patient advised that he will be infectious for 24 hours after starting antibiotics. Follow up as needed.  Ibuprofen or Tylenol for pain, fever or general discomfort.  Discussed treatment with patient based on clinical presentation despite negative strep.  Patient to go to ER if difficulty swallowing, difficulty breathing or other concerns.  Patient verbalizes understanding and has no questions at time of discharge. Meds ordered this encounter  Medications  . amoxicillin (AMOXIL) 875 MG tablet    Sig: Take 1 tablet (875 mg total) by mouth 2 (two) times daily for 10 days.    Dispense:  20 tablet    Refill:  0    Order Specific Question:   Supervising Provider    Answer:   Ricard Dillon [[id number]]  . magic mouthwash w/lidocaine SOLN    Sig: Take 5 mLs by mouth 3 (three) times daily as needed for up to 7 days for mouth pain.    Dispense:  120 mL    Refill:  0    1:1 ratio    Order Specific Question:   Supervising Provider    Answer:   Ricard Dillon [[id number]]  . benzonatate (TESSALON PERLES) 100 MG capsule    Sig: Take 1 capsule (100 mg total) by mouth 3 (three) times daily as needed for up to 10 days for cough.    Dispense:  30 capsule    Refill:  0  Order Specific Question:   Supervising Provider    Answer:   Ricard Dillon [phone]

## 2017-05-21 NOTE — Patient Instructions (Signed)

## 2017-05-25 ENCOUNTER — Ambulatory Visit (INDEPENDENT_AMBULATORY_CARE_PROVIDER_SITE_OTHER): Payer: 59 | Admitting: Emergency Medicine

## 2017-05-25 DIAGNOSIS — E538 Deficiency of other specified B group vitamins: Secondary | ICD-10-CM | POA: Diagnosis not present

## 2017-05-25 MED ORDER — CYANOCOBALAMIN 1000 MCG/ML IJ SOLN
1000.0000 ug | Freq: Once | INTRAMUSCULAR | Status: AC
  Administered 2017-05-25: 1000 ug via INTRAMUSCULAR

## 2017-05-25 NOTE — Progress Notes (Signed)
Pre visit review using our clinic review tool, if applicable. No additional management support is needed unless otherwise documented below in the visit note.   Pt came in clinic for monthly B12 injection. IM injection given in left deltoid. Patient tolerated it well.   Pt wanted Dr. Lorelei Pont to know that Dr. Posey Pronto with [facility] suggest that pt start back doing weekly B12 injections. Message sent to Dr. Lorelei Pont to get approval to order weekly B12 injections for pt. Will schedule appt once Dr. Lorelei Pont approves.

## 2017-06-01 ENCOUNTER — Telehealth: Payer: Self-pay

## 2017-06-01 ENCOUNTER — Ambulatory Visit: Payer: 59

## 2017-06-01 ENCOUNTER — Ambulatory Visit (INDEPENDENT_AMBULATORY_CARE_PROVIDER_SITE_OTHER): Payer: 59

## 2017-06-01 DIAGNOSIS — E538 Deficiency of other specified B group vitamins: Secondary | ICD-10-CM | POA: Diagnosis not present

## 2017-06-01 MED ORDER — CYANOCOBALAMIN 1000 MCG/ML IJ SOLN: 1000.0000 ug | INTRAMUSCULAR | 2 refills | Status: DC

## 2017-06-01 MED ORDER — "SYRINGE/NEEDLE (DISP) 23G X 1"" 3 ML MISC": 1 refills | Status: DC

## 2017-06-01 MED ORDER — "NEEDLE (DISP) 25G X 1"" MISC": 1 refills | Status: DC

## 2017-06-01 MED ORDER — CYANOCOBALAMIN 1000 MCG/ML IJ SOLN
1000.0000 ug | Freq: Once | INTRAMUSCULAR | Status: AC
  Administered 2017-06-01: 1000 ug via INTRAMUSCULAR

## 2017-06-01 MED FILL — SAFETY-LOK 3 ML SYRINGE: 23G X 1" | 50 days supply | Qty: 50 | Fill #0

## 2017-06-01 MED FILL — CYANOCOBALAMIN 1,000 MCG/ML: 1000 | 28 days supply | Qty: 4 | Fill #0

## 2017-06-01 MED FILL — BD PRECISIONGLIDE NDL 25G: 25G X 1" | 50 days supply | Qty: 50 | Fill #0

## 2017-06-01 NOTE — Progress Notes (Signed)
Pre visit review using our clinic review tool, if applicable. No additional management support is needed unless otherwise documented below in the visit note.  Pt here today for B12 injections. She requested to have supplies for B12 injections and needles sent to pharmacy where she can perform B12 shots at home (she is an Therapist, sports for [employer]). Supplies and medication sent to [facility].   41mL injected into L deltoid. Pt tolerated injection well.   She will start to perform weekly B12 injections at home herself. Nurse visit not scheduled for next B12.

## 2017-06-04 ENCOUNTER — Encounter: Payer: Self-pay | Admitting: Family Medicine

## 2017-06-04 MED ORDER — VENLAFAXINE HCL ER 150 MG PO CP24: 150.0000 mg | ORAL_CAPSULE | Freq: Every day | ORAL | 3 refills | Status: DC

## 2017-06-04 MED FILL — VENLAFAXINE HCL ER 150 MG C: 150 | 90 days supply | Qty: 90 | Fill #0

## 2017-06-21 ENCOUNTER — Other Ambulatory Visit: Payer: Self-pay | Admitting: Family

## 2017-06-21 DIAGNOSIS — D649 Anemia, unspecified: Secondary | ICD-10-CM

## 2017-06-22 ENCOUNTER — Encounter: Payer: Self-pay | Admitting: Internal Medicine

## 2017-06-22 ENCOUNTER — Encounter: Payer: Self-pay | Admitting: Family

## 2017-06-22 ENCOUNTER — Inpatient Hospital Stay: Payer: 59 | Attending: Family | Admitting: Family

## 2017-06-22 ENCOUNTER — Telehealth: Payer: Self-pay | Admitting: Family Medicine

## 2017-06-22 ENCOUNTER — Other Ambulatory Visit: Payer: Self-pay

## 2017-06-22 ENCOUNTER — Encounter: Payer: Self-pay | Admitting: Family Medicine

## 2017-06-22 ENCOUNTER — Inpatient Hospital Stay: Payer: 59

## 2017-06-22 VITALS — BP 130/75 | HR 78 | Temp 98.1°F | Resp 16 | Wt 230.0 lb

## 2017-06-22 DIAGNOSIS — E538 Deficiency of other specified B group vitamins: Secondary | ICD-10-CM | POA: Insufficient documentation

## 2017-06-22 DIAGNOSIS — R002 Palpitations: Secondary | ICD-10-CM | POA: Diagnosis not present

## 2017-06-22 DIAGNOSIS — D509 Iron deficiency anemia, unspecified: Secondary | ICD-10-CM | POA: Diagnosis not present

## 2017-06-22 DIAGNOSIS — R42 Dizziness and giddiness: Secondary | ICD-10-CM | POA: Insufficient documentation

## 2017-06-22 DIAGNOSIS — R6883 Chills (without fever): Secondary | ICD-10-CM | POA: Insufficient documentation

## 2017-06-22 DIAGNOSIS — R109 Unspecified abdominal pain: Secondary | ICD-10-CM | POA: Insufficient documentation

## 2017-06-22 DIAGNOSIS — K625 Hemorrhage of anus and rectum: Secondary | ICD-10-CM | POA: Insufficient documentation

## 2017-06-22 DIAGNOSIS — F5089 Other specified eating disorder: Secondary | ICD-10-CM | POA: Diagnosis not present

## 2017-06-22 DIAGNOSIS — R200 Anesthesia of skin: Secondary | ICD-10-CM | POA: Diagnosis not present

## 2017-06-22 DIAGNOSIS — R5383 Other fatigue: Secondary | ICD-10-CM | POA: Diagnosis not present

## 2017-06-22 DIAGNOSIS — L299 Pruritus, unspecified: Secondary | ICD-10-CM | POA: Insufficient documentation

## 2017-06-22 DIAGNOSIS — Z79899 Other long term (current) drug therapy: Secondary | ICD-10-CM | POA: Diagnosis not present

## 2017-06-22 DIAGNOSIS — R201 Hypoesthesia of skin: Secondary | ICD-10-CM | POA: Insufficient documentation

## 2017-06-22 DIAGNOSIS — R140 Abdominal distension (gaseous): Secondary | ICD-10-CM | POA: Insufficient documentation

## 2017-06-22 DIAGNOSIS — Z0000 Encounter for general adult medical examination without abnormal findings: Secondary | ICD-10-CM

## 2017-06-22 DIAGNOSIS — D649 Anemia, unspecified: Secondary | ICD-10-CM

## 2017-06-22 LAB — FERRITIN: FERRITIN: 17 ng/mL (ref 9–269)

## 2017-06-22 LAB — CMP (CANCER CENTER ONLY)
ALBUMIN: 3.8 g/dL (ref 3.5–5.0)
ALT: 24 U/L (ref 0–55)
AST: 19 U/L (ref 5–34)
Alkaline Phosphatase: 110 U/L (ref 40–150)
Anion gap: 7 (ref 3–11)
BUN: 7 mg/dL (ref 7–26)
CHLORIDE: 103 mmol/L (ref 98–109)
CO2: 28 mmol/L (ref 22–29)
CREATININE: 0.75 mg/dL (ref 0.60–1.10)
Calcium: 9.5 mg/dL (ref 8.4–10.4)
GFR, Est AFR Am: >60 mL/min (ref >60)
GFR, Estimated: >60 mL/min (ref >60)
GLUCOSE: 93 mg/dL (ref 70–140)
Potassium: 3.7 mmol/L (ref 3.5–5.1)
SODIUM: 138 mmol/L (ref 136–145)
Total Bilirubin: 0.4 mg/dL (ref 0.2–1.2)
Total Protein: 7.3 g/dL (ref 6.4–8.3)

## 2017-06-22 LAB — CBC WITH DIFFERENTIAL (CANCER CENTER ONLY)
BASOS ABS: 0.1 10*3/uL (ref 0.0–0.1)
BASOS PCT: 0 %
Eosinophils Absolute: 0.2 10*3/uL (ref 0.0–0.5)
Eosinophils Relative: 2 %
HCT: 37.9 % (ref 34.8–46.6)
Hemoglobin: 11.7 g/dL (ref 11.6–15.9)
LYMPHS PCT: 22 %
Lymphs Abs: 2.5 10*3/uL (ref 0.9–3.3)
MCH: 24.3 pg — ABNORMAL LOW (ref 26.0–34.0)
MCHC: 30.9 g/dL — ABNORMAL LOW (ref 32.0–36.0)
MCV: 78.6 fL — AB (ref 81.0–101.0)
MONO ABS: 0.7 10*3/uL (ref 0.1–0.9)
Monocytes Relative: 6 %
Neutro Abs: 8 10*3/uL — ABNORMAL HIGH (ref 1.5–6.5)
Neutrophils Relative %: 70 %
Platelet Count: 447 10*3/uL — ABNORMAL HIGH (ref 145–400)
RBC: 4.82 MIL/uL (ref 3.70–5.32)
RDW: 15.5 % (ref 11.1–15.7)
WBC: 11.5 10*3/uL — AB (ref 3.9–10.0)

## 2017-06-22 LAB — IRON AND TIBC
IRON: 40 ug/dL — AB (ref 41–142)
SATURATION RATIOS: 12 % — AB (ref 21–57)
TIBC: 324 ug/dL (ref 236–444)
UIBC: 284 ug/dL

## 2017-06-22 LAB — VITAMIN B12: Vitamin B-12: 490 pg/mL (ref 180–914)

## 2017-06-22 LAB — LACTATE DEHYDROGENASE: LDH: 217 U/L (ref 125–245)

## 2017-06-22 LAB — SAVE SMEAR

## 2017-06-22 LAB — RETICULOCYTES
RBC.: 4.81 MIL/uL (ref 3.70–5.45)
Retic Count, Absolute: 77 10*3/uL (ref 33.7–90.7)
Retic Ct Pct: 1.6 % (ref 0.7–2.1)

## 2017-06-22 NOTE — Telephone Encounter (Signed)
-----   Message from Eliezer Bottom, NP sent at 06/22/2017 12:48 PM EDT ----- Regarding: GI bleed I saw this sweet lady today and she said since last Friday she has had 4-5 episodes of bright red blood in her stool when having a BM. She denies straining or constipation. There is no family history of colon cancer but it would be nice to have her checked out.  Would you mind putting in a referral to GI? We are having issues with some insurances kicking back specialist to specialist referrals.  Thanks so much for your help! Hope you have a wonderful weekend.   Judson Roch

## 2017-06-22 NOTE — Progress Notes (Signed)
Hematology/Oncology Consultation   Name: [name]      MRN: [id number]    Location: Room/bed info not found  Date: 06/22/2017 Time:10:41 AM   REFERRING PHYSICIAN: Silvestre Mesi, MD  REASON FOR CONSULT: Anemia   DIAGNOSIS:  Iron deficiency anemia  HISTORY OF PRESENT ILLNESS: [name] is a very pleasant 42 yo caucasian female with history of anemia. She is symptomatic with fatigue, dizziness, chills, palpitations and chewing ice.  She has also noticed itching along her arms. No raised rash noted on exam.  She did become dizzy while in her chair in the exam room during our visit. This improved once she drank some apple juice.  Hgb is 11.7, MCV 78, platelet count is 447.  She is taking an oral iron supplement daily but has not been able to notice much difference.  She has history of iron deficiency with all her pregnancies and had heavy cycles after her last pregnancy requiring a D&C in April 2018.  Her cycles are regular and not heavy now.  She has noticed 4-5 episodes of bright red blood on her toilet tissue since Friday. She denies straining or having history of hemorrhoids.  She denies taking aspirin or NSAIDs.   No other bleeding noted, no bruising or petechiae.  She also has B 12 deficiency and is currently on weekly injections.  No family history of anemia that she knows of.  Her mother has a history of PE and a clotting issue.  The patient has 3 children and history of 1 miscarriage prior to 8 weeks. No personal cancer history. Family history includes: maternal grandfather - prostate and 1 maternal aunts - breast.  No fever, n/v, cough, SOB, chest pain or changes in bladder habits.  She has noted some bloating of the abdomen and an intermittent pain in the right flank below the rib cage.  No personal or family history of cirrhosis, NASH, or liver cancer.  She has puffiness in the right hand that comes and goes. She has noticed that the numbness and tingling in her hands  and feet has improved since she started the weekly B 12 injections 6 weeks ago.  She has a good appetite and is hydrating well. Her weight is stable.  She does not smoke. She rarely has an alcoholic beverage socially.  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   ROS: All other 10 point review of systems is negative.   PAST MEDICAL HISTORY:   Past Medical History:  Diagnosis Date  . Anemia   . Anxiety   . Chicken pox   . Depression   . GERD (gastroesophageal reflux disease)    tums prn-with pregnancy only  . Headache(784.0)   . IBS (irritable bowel syndrome)   . Migraines   . Placenta previa 2012   with current pregnancy-type and cross 2 Units per protocol  . PONV (postoperative nausea and vomiting)   . Shortness of breath dyspnea    with this pregnancy   . Syringomyelia, acquired ([place])     ALLERGIES: Allergies  Allergen Reactions  . Topamax [Topiramate] Other (See Comments)    Pt states that she has trouble recalling words when she takes this medication.      MEDICATIONS:  Current Outpatient Medications on File Prior to Visit  Medication Sig Dispense Refill  . budesonide-formoterol (SYMBICORT) 80-4.5 MCG/ACT inhaler Inhale 2 puffs into the lungs 2 (two) times daily. (Patient not taking:  Reported on 05/21/2017) 1 Inhaler 3  . cyanocobalamin (,VITAMIN B-12,) 1000 MCG/ML injection Inject 1 mL (1,000 mcg total) into the muscle once a week. 10 mL 2  . ibuprofen (ADVIL,MOTRIN) 600 MG tablet Take 1 tablet (600 mg total) by mouth every 6 (six) hours as needed. 30 tablet 1  . lidocaine (XYLOCAINE) 2 % solution     . loratadine (CLARITIN) 10 MG tablet Take 1 tablet (10 mg total) by mouth daily. (Patient not taking: Reported on 05/21/2017) 30 tablet 11  . magic mouthwash w/lidocaine SOLN   0  . Multiple Vitamin (MULTIVITAMIN) tablet Take 1 tablet by mouth daily.    Marland Kitchen NEEDLE, DISP, 25 G (B-D DISP NEEDLE 25GX1") 25G X 1" MISC To  use w/ B12 injection 50 each 1  . sertraline (ZOLOFT) 50 MG tablet Take 1 tablet (50 mg total) by mouth daily. (Patient not taking: Reported on 05/21/2017) 30 tablet 5  . SYRINGE-NEEDLE, DISP, 3 ML (BD ECLIPSE SYRINGE) 23G X 1" 3 ML MISC To use w/ B12 injection 50 each 1  . venlafaxine XR (EFFEXOR-XR) 150 MG 24 hr capsule Take 1 capsule (150 mg total) by mouth daily with breakfast. 90 capsule 3   No current facility-administered medications on file prior to visit.      PAST SURGICAL HISTORY Past Surgical History:  Procedure Laterality Date  . APPENDECTOMY  2005  . arthroscopic knee  1990  . CESAREAN SECTION  12/27/2010   Procedure: CESAREAN SECTION;  Surgeon: Shon Millet II;  Location: [facility];  Service: Gynecology;  Laterality: N/A;  . CESAREAN SECTION N/A 02/03/2015   Procedure: CESAREAN SECTION;  Surgeon: Everlene Farrier, MD;  Location: [facility];  Service: Obstetrics;  Laterality: N/A;  . left breast reduction  1996  . TUBAL LIGATION Bilateral 02/03/2015   Procedure: BILATERAL TUBAL LIGATION;  Surgeon: Everlene Farrier, MD;  Location: [facility];  Service: Obstetrics;  Laterality: Bilateral;  . WISDOM TOOTH EXTRACTION      FAMILY HISTORY: Family History  Problem Relation Age of Onset  . High blood pressure Mother   . Arthritis Mother   . Hypertension Mother   . Other Mother        "Multisystem atrophy"  . Alcohol abuse Father   . Arthritis Father   . High blood pressure Brother     SOCIAL HISTORY:  reports that she has never smoked. She has never used smokeless tobacco. She reports that she does not drink alcohol or use drugs.  PERFORMANCE STATUS: The patient's performance status is 1 - Symptomatic but completely ambulatory  PHYSICAL EXAM: Most Recent Vital Signs: currently breastfeeding. BP 130/75 (BP Location: Left Arm, Patient Position: Sitting)   Pulse 78   Temp 98.1 F (36.7 C) (Oral)   Resp 16   Wt 230 lb (104.3 kg)   SpO2 99%   BMI 34.97 kg/m   General Appearance:     Alert, cooperative, no distress, appears stated age  Head:    Normocephalic, without obvious abnormality, atraumatic  Eyes:    PERRL, conjunctiva/corneas clear, EOM's intact, fundi    benign, both eyes        Throat:   Lips, mucosa, and tongue normal; teeth and gums normal  Neck:   Supple, symmetrical, trachea midline, no adenopathy;    thyroid:  no enlargement/tenderness/nodules; no carotid   bruit or JVD  Back:     Symmetric, no curvature, ROM normal, no CVA tenderness  Lungs:     Clear to auscultation bilaterally,  respirations unlabored  Chest Wall:    No tenderness or deformity   Heart:    Regular rate and rhythm, S1 and S2 normal, no murmur, rub   or gallop     Abdomen:     Soft, non-tender, bowel sounds active all four quadrants,    no masses, no organomegaly        Extremities:   Extremities normal, atraumatic, no cyanosis or edema  Pulses:   2+ and symmetric all extremities  Skin:   Skin color, texture, turgor normal, no rashes or lesions  Lymph nodes:   Cervical, supraclavicular, and axillary nodes normal  Neurologic:   CNII-XII intact, normal strength, sensation and reflexes    throughout    LABORATORY DATA:  Results for orders placed or performed in visit on 06/22/17 (from the past 48 hour(s))  CBC with Differential (Cancer Center Only)     Status: Abnormal   Collection Time: 06/22/17 10:10 AM  Result Value Ref Range   WBC Count 11.5 (H) 3.9 - 10.0 K/uL   RBC 4.82 3.70 - 5.32 MIL/uL   Hemoglobin 11.7 11.6 - 15.9 g/dL   HCT 37.9 34.8 - 46.6 %   MCV 78.6 (L) 81.0 - 101.0 fL   MCH 24.3 (L) 26.0 - 34.0 pg   MCHC 30.9 (L) 32.0 - 36.0 g/dL   RDW 15.5 11.1 - 15.7 %   Platelet Count 447 (H) 145 - 400 K/uL   Neutrophils Relative % 70 %   Neutro Abs 8.0 (H) 1.5 - 6.5 K/uL   Lymphocytes Relative 22 %   Lymphs Abs 2.5 0.9 - 3.3 K/uL   Monocytes Relative 6 %   Monocytes Absolute 0.7 0.1 - 0.9 K/uL   Eosinophils Relative 2 %   Eosinophils Absolute 0.2 0.0 - 0.5 K/uL    Basophils Relative 0 %   Basophils Absolute 0.1 0.0 - 0.1 K/uL    Comment: Performed at [facility] at [facility], [address], [place], [place] [address]  Save smear     Status: None   Collection Time: 06/22/17 10:11 AM  Result Value Ref Range   Smear Review SMEAR STAINED AND AVAILABLE FOR REVIEW     Comment: Performed at [facility] at [facility], [address] [place], [place] [address]      RADIOGRAPHY: No results found.     PATHOLOGY: None   ASSESSMENT/PLAN: Ms. Danford is a very pleasant 42 yo caucasian female with history of iron deficiency anemia. She is symptomatic as mentioned above on a daily oral iron supplement.   Hgb is 11.9 and MCV is down at 78. Patelet count is 447.  With this recent rectal bleeding we will recommend to her PCP that she be referred to GI for further work up.  We will see what her iron studies show and bring her back in for infusion if needed.  B 12 and Vitamin D were also rechecked today and results are pending.   All questions were answered and she is in agreement with the plan. We will follow-up with her once all of her lab work is available. She will contact our office with any questions or concerns. We can certainly see her sooner if needed.  She was discussed with and also seen by Dr. Marin Olp and he is in agreement with the aforementioned.   Laverna Peace     Addendum: I saw and examined Ms. Naaman Plummer with Judson Roch.  I agree with her above assessment.  I looked at her blood smear under the microscope.  Her blood smear shows some hypochromic and microcytic red blood cells.  She had no target cells.  I saw no teardrop cells.  She had no rouleaux formation.  There were no inclusion bodies.  White blood cells appeared normal in morphology and maturation.  Platelets looked okay.  Platelets look mature with good granulation.  Her iron studies show her ferritin of 17 with iron saturation of  12%.  Her vitamin B12 level is 490.  She had a normal methylmalonic acid level of 111.  I totally agree that she needs to see her gastroenterologist because of the rectal bleeding.  We will go ahead and get her back for some IV iron.  I know that this will help.  We spent about 40 minutes with her.  All of the time was spent face-to-face.  Lattie Haw, MD

## 2017-06-23 LAB — VITAMIN D 25 HYDROXY (VIT D DEFICIENCY, FRACTURES): Vit D, 25-Hydroxy: 20.4 ng/mL — ABNORMAL LOW (ref 30.0–100.0)

## 2017-06-23 LAB — ERYTHROPOIETIN: Erythropoietin: 26.2 m[IU]/mL — ABNORMAL HIGH (ref 2.6–18.5)

## 2017-06-25 DIAGNOSIS — Z803 Family history of malignant neoplasm of breast: Secondary | ICD-10-CM | POA: Diagnosis not present

## 2017-06-25 DIAGNOSIS — Z8042 Family history of malignant neoplasm of prostate: Secondary | ICD-10-CM | POA: Diagnosis not present

## 2017-06-25 DIAGNOSIS — N939 Abnormal uterine and vaginal bleeding, unspecified: Secondary | ICD-10-CM | POA: Diagnosis not present

## 2017-06-25 DIAGNOSIS — Z6835 Body mass index (BMI) 35.0-35.9, adult: Secondary | ICD-10-CM | POA: Diagnosis not present

## 2017-06-25 DIAGNOSIS — Z01419 Encounter for gynecological examination (general) (routine) without abnormal findings: Secondary | ICD-10-CM | POA: Diagnosis not present

## 2017-06-25 DIAGNOSIS — N921 Excessive and frequent menstruation with irregular cycle: Secondary | ICD-10-CM | POA: Diagnosis not present

## 2017-06-25 DIAGNOSIS — Z808 Family history of malignant neoplasm of other organs or systems: Secondary | ICD-10-CM | POA: Diagnosis not present

## 2017-06-25 DIAGNOSIS — Z1231 Encounter for screening mammogram for malignant neoplasm of breast: Secondary | ICD-10-CM | POA: Diagnosis not present

## 2017-06-25 LAB — METHYLMALONIC ACID, SERUM: METHYLMALONIC ACID, QUANTITATIVE: 111 nmol/L (ref 0–378)

## 2017-06-26 ENCOUNTER — Other Ambulatory Visit: Payer: Self-pay | Admitting: Family

## 2017-06-26 DIAGNOSIS — D500 Iron deficiency anemia secondary to blood loss (chronic): Secondary | ICD-10-CM | POA: Insufficient documentation

## 2017-06-26 DIAGNOSIS — D509 Iron deficiency anemia, unspecified: Secondary | ICD-10-CM | POA: Insufficient documentation

## 2017-06-26 DIAGNOSIS — H5213 Myopia, bilateral: Secondary | ICD-10-CM | POA: Diagnosis not present

## 2017-06-26 DIAGNOSIS — E559 Vitamin D deficiency, unspecified: Secondary | ICD-10-CM

## 2017-06-26 DIAGNOSIS — D508 Other iron deficiency anemias: Secondary | ICD-10-CM

## 2017-06-26 MED ORDER — ERGOCALCIFEROL 1.25 MG (50000 UT) PO CAPS: 50000.0000 [IU] | ORAL_CAPSULE | ORAL | 6 refills | Status: DC

## 2017-06-27 ENCOUNTER — Encounter: Payer: Self-pay | Admitting: Family

## 2017-07-02 ENCOUNTER — Inpatient Hospital Stay: Payer: 59

## 2017-07-02 VITALS — BP 132/80 | HR 90 | Temp 97.7°F | Resp 16

## 2017-07-02 DIAGNOSIS — L299 Pruritus, unspecified: Secondary | ICD-10-CM | POA: Diagnosis not present

## 2017-07-02 DIAGNOSIS — D509 Iron deficiency anemia, unspecified: Secondary | ICD-10-CM | POA: Diagnosis not present

## 2017-07-02 DIAGNOSIS — R6883 Chills (without fever): Secondary | ICD-10-CM | POA: Diagnosis not present

## 2017-07-02 DIAGNOSIS — R5383 Other fatigue: Secondary | ICD-10-CM | POA: Diagnosis not present

## 2017-07-02 DIAGNOSIS — D500 Iron deficiency anemia secondary to blood loss (chronic): Secondary | ICD-10-CM

## 2017-07-02 DIAGNOSIS — R002 Palpitations: Secondary | ICD-10-CM | POA: Diagnosis not present

## 2017-07-02 DIAGNOSIS — E538 Deficiency of other specified B group vitamins: Secondary | ICD-10-CM | POA: Diagnosis not present

## 2017-07-02 DIAGNOSIS — F5089 Other specified eating disorder: Secondary | ICD-10-CM | POA: Diagnosis not present

## 2017-07-02 DIAGNOSIS — R42 Dizziness and giddiness: Secondary | ICD-10-CM | POA: Diagnosis not present

## 2017-07-02 DIAGNOSIS — K625 Hemorrhage of anus and rectum: Secondary | ICD-10-CM | POA: Diagnosis not present

## 2017-07-02 MED ORDER — SODIUM CHLORIDE 0.9 % IV SOLN
510.0000 mg | Freq: Once | INTRAVENOUS | Status: AC
  Administered 2017-07-02: 510 mg via INTRAVENOUS
  Filled 2017-07-02: qty 17

## 2017-07-02 MED ORDER — SODIUM CHLORIDE 0.9 % IV SOLN
Freq: Once | INTRAVENOUS | Status: AC
  Administered 2017-07-02: 14:00:00 via INTRAVENOUS

## 2017-07-02 NOTE — Patient Instructions (Signed)

## 2017-07-10 MED FILL — CYANOCOBALAMIN 1,000 MCG/ML: 1000 | 28 days supply | Qty: 4 | Fill #1

## 2017-07-11 DIAGNOSIS — L408 Other psoriasis: Secondary | ICD-10-CM | POA: Diagnosis not present

## 2017-07-11 MED FILL — CLOBETASOL PROPIONATE 0.05: 0.05 | 30 days supply | Qty: 125 | Fill #0

## 2017-07-27 ENCOUNTER — Other Ambulatory Visit: Payer: Self-pay | Admitting: Family

## 2017-07-27 DIAGNOSIS — D500 Iron deficiency anemia secondary to blood loss (chronic): Secondary | ICD-10-CM

## 2017-07-27 DIAGNOSIS — E559 Vitamin D deficiency, unspecified: Secondary | ICD-10-CM

## 2017-07-30 ENCOUNTER — Other Ambulatory Visit: Payer: Self-pay

## 2017-07-30 ENCOUNTER — Inpatient Hospital Stay: Payer: 59

## 2017-07-30 ENCOUNTER — Inpatient Hospital Stay: Payer: 59 | Attending: Family | Admitting: Family

## 2017-07-30 ENCOUNTER — Encounter: Payer: Self-pay | Admitting: Family

## 2017-07-30 VITALS — BP 134/67 | HR 78 | Temp 98.0°F | Resp 16 | Wt 230.0 lb

## 2017-07-30 DIAGNOSIS — R0609 Other forms of dyspnea: Secondary | ICD-10-CM | POA: Diagnosis not present

## 2017-07-30 DIAGNOSIS — R202 Paresthesia of skin: Secondary | ICD-10-CM | POA: Diagnosis not present

## 2017-07-30 DIAGNOSIS — R42 Dizziness and giddiness: Secondary | ICD-10-CM | POA: Diagnosis not present

## 2017-07-30 DIAGNOSIS — R200 Anesthesia of skin: Secondary | ICD-10-CM | POA: Insufficient documentation

## 2017-07-30 DIAGNOSIS — R5383 Other fatigue: Secondary | ICD-10-CM | POA: Diagnosis not present

## 2017-07-30 DIAGNOSIS — D500 Iron deficiency anemia secondary to blood loss (chronic): Secondary | ICD-10-CM

## 2017-07-30 DIAGNOSIS — Z79899 Other long term (current) drug therapy: Secondary | ICD-10-CM | POA: Diagnosis not present

## 2017-07-30 DIAGNOSIS — R002 Palpitations: Secondary | ICD-10-CM | POA: Insufficient documentation

## 2017-07-30 DIAGNOSIS — R6883 Chills (without fever): Secondary | ICD-10-CM | POA: Insufficient documentation

## 2017-07-30 DIAGNOSIS — E559 Vitamin D deficiency, unspecified: Secondary | ICD-10-CM

## 2017-07-30 DIAGNOSIS — R531 Weakness: Secondary | ICD-10-CM | POA: Insufficient documentation

## 2017-07-30 DIAGNOSIS — N920 Excessive and frequent menstruation with regular cycle: Secondary | ICD-10-CM | POA: Diagnosis not present

## 2017-07-30 LAB — CBC WITH DIFFERENTIAL (CANCER CENTER ONLY)
BASOS ABS: 0 10*3/uL (ref 0.0–0.1)
Basophils Relative: 0 %
EOS ABS: 0.2 10*3/uL (ref 0.0–0.5)
EOS PCT: 2 %
HCT: 39.9 % (ref 34.8–46.6)
Hemoglobin: 12.4 g/dL (ref 11.6–15.9)
LYMPHS ABS: 2.1 10*3/uL (ref 0.9–3.3)
LYMPHS PCT: 22 %
MCH: 25.7 pg (ref 25.1–34.0)
MCHC: 31.1 g/dL — ABNORMAL LOW (ref 31.5–36.0)
MCV: 82.6 fL (ref 79.5–101.0)
Monocytes Absolute: 0.5 10*3/uL (ref 0.1–0.9)
Monocytes Relative: 5 %
NEUTROS PCT: 71 %
Neutro Abs: 7.1 10*3/uL — ABNORMAL HIGH (ref 1.5–6.5)
PLATELETS: 403 10*3/uL — AB (ref 145–400)
RBC: 4.83 MIL/uL (ref 3.70–5.45)
RDW: 18 % — ABNORMAL HIGH (ref 11.2–14.5)
WBC: 9.8 10*3/uL (ref 3.9–10.3)

## 2017-07-30 LAB — FERRITIN: Ferritin: 80 ng/mL (ref 9–269)

## 2017-07-30 LAB — RETICULOCYTES
RBC.: 4.86 MIL/uL (ref 3.70–5.45)
RETIC COUNT ABSOLUTE: 72.9 10*3/uL (ref 33.7–90.7)
Retic Ct Pct: 1.5 % (ref 0.7–2.1)

## 2017-07-30 LAB — IRON AND TIBC
IRON: 35 ug/dL — AB (ref 41–142)
SATURATION RATIOS: 13 % — AB (ref 21–57)
TIBC: 273 ug/dL (ref 236–444)
UIBC: 238 ug/dL

## 2017-07-30 NOTE — Progress Notes (Signed)
Hematology and Oncology Follow Up Visit  [name] [id number] [birth year] 42 y.o. 42 y.o. 07/30/2017   Principle Diagnosis:  Iron deficiency anemia Vitamin D deficiency   Current Therapy:   IV iron as indicated - last received in May 2018 Vitamin D 50,000 units PO once a week   Interim History:  [name] is here today for follow-up. She received IV iron in early May and has not been able to tell a difference.  She had a "very heavy" cycle after she received iron. No other episodes of bleeding, no bruising or petechiae.  She verbalized that she is taking her vitamin D supplement PO once a week as prescribed.  She states that she is symptomatic with fatigue, weakness, lightheadedness, SOB with exertion, chills, palpitations and numbness and tingling in her hands and feet.  No swelling or tenderness in her extremities. No c/o pain. No lymphadenopathy noted on exam.  She has maintained a good appetite and is staying well hydrated. Her weight is stable.    ECOG Performance Status: 1 - Symptomatic but completely ambulatory  Medications:  Allergies as of 07/30/2017      Reactions   Topamax [topiramate] Other (See Comments)   Pt states that she has trouble recalling words when she takes this medication.      Medication List        Accurate as of 07/30/17 11:45 AM. Always use your most recent med list.          budesonide-formoterol 80-4.5 MCG/ACT inhaler Commonly known as:  SYMBICORT Inhale 2 puffs into the lungs 2 (two) times daily.   cyanocobalamin 1000 MCG/ML injection Commonly known as:  (VITAMIN B-12) Inject 1 mL (1,000 mcg total) into the muscle once a week.   ergocalciferol 50000 units capsule Commonly known as:  VITAMIN D2 Take 1 capsule (50,000 Units total) by mouth once a week.   ibuprofen 600 MG tablet Commonly known as:  ADVIL,MOTRIN Take 1 tablet (600 mg total) by mouth every 6 (six) hours as needed.   lidocaine 2 % solution Commonly known as:   XYLOCAINE   loratadine 10 MG tablet Commonly known as:  CLARITIN Take 1 tablet (10 mg total) by mouth daily.   multivitamin tablet Take 1 tablet by mouth daily.   OTEZLA 30 MG Tabs Generic drug:  Apremilast Take 30 mg by mouth daily.   venlafaxine XR 150 MG 24 hr capsule Commonly known as:  EFFEXOR-XR Take 1 capsule (150 mg total) by mouth daily with breakfast.       Allergies:  Allergies  Allergen Reactions  . Topamax [Topiramate] Other (See Comments)    Pt states that she has trouble recalling words when she takes this medication.    Past Medical History, Surgical history, Social history, and Family History were reviewed and updated.  Review of Systems: All other 10 point review of systems is negative.   Physical Exam:  weight is 230 lb (104.3 kg). Her oral temperature is 98 F (36.7 C). Her blood pressure is 134/67 and her pulse is 78. Her respiration is 16 and oxygen saturation is 100%.   Wt Readings from Last 3 Encounters:  07/30/17 230 lb (104.3 kg)  06/22/17 230 lb (104.3 kg)  05/21/17 227 lb 3.2 oz (103.1 kg)    Ocular: Sclerae unicteric, pupils equal, round and reactive to light Ear-nose-throat: Oropharynx clear, dentition fair Lymphatic: No cervical, supraclavicular or axillary adenopathy Lungs no rales or rhonchi, good excursion bilaterally Heart regular rate and rhythm, no murmur  appreciated Abd soft, nontender, positive bowel sounds, no liver or spleen tip palpated on exam, no fluid wave MSK no focal spinal tenderness, no joint edema Neuro: non-focal, well-oriented, appropriate affect Breasts: Deferred   Lab Results  Component Value Date   WBC 11.5 (H) 06/22/2017   HGB 11.7 06/22/2017   HCT 37.9 06/22/2017   MCV 78.6 (L) 06/22/2017   PLT 447 (H) 06/22/2017   Lab Results  Component Value Date   FERRITIN 17 06/22/2017   IRON 40 (L) 06/22/2017   TIBC 324 06/22/2017   UIBC 284 06/22/2017   IRONPCTSAT 12 (L) 06/22/2017   Lab Results   Component Value Date   RETICCTPCT 1.6 06/22/2017   RBC 4.82 06/22/2017   RBC 4.81 06/22/2017   No results found for: Nils Pyle [facility] Lab Results  Component Value Date   IGGSERUM 1,160 06/27/2012   IGA 253 06/27/2012   IGMSERUM 69 06/27/2012   Lab Results  Component Value Date   TOTALPROTELP 7.0 06/27/2012     Chemistry      Component Value Date/Time   NA 138 06/22/2017 1010   K 3.7 06/22/2017 1010   CL 103 06/22/2017 1010   CO2 28 06/22/2017 1010   BUN 7 06/22/2017 1010   CREATININE 0.75 06/22/2017 1010      Component Value Date/Time   CALCIUM 9.5 06/22/2017 1010   ALKPHOS 110 06/22/2017 1010   AST 19 06/22/2017 1010   ALT 24 06/22/2017 1010   BILITOT 0.4 06/22/2017 1010      Impression and Plan: [name] is a very pleasant 42 yo caucasian female with iron deficiency anemia secondary to heavy cycles as well as vitamin D deficiency.  She is still quite symptomatic as mentioned above. We will see what her iron studies show and bring her back in for infusion if needed.  We will plan to see her back in another 6 weeks for follow-up.  She will contact our office with any questions or concerns. We can certainly see her sooner if need be.   Laverna Peace, NP 6/10/201911:45 AM

## 2017-07-31 LAB — VITAMIN D 25 HYDROXY (VIT D DEFICIENCY, FRACTURES): Vit D, 25-Hydroxy: 24.6 ng/mL — ABNORMAL LOW (ref 30.0–100.0)

## 2017-08-01 ENCOUNTER — Encounter: Payer: Self-pay | Admitting: Family

## 2017-08-07 MED FILL — CYANOCOBALAMIN 1,000 MCG/ML: 1000 | 28 days supply | Qty: 4 | Fill #2

## 2017-08-08 ENCOUNTER — Inpatient Hospital Stay: Payer: 59

## 2017-08-08 VITALS — BP 132/66 | HR 83 | Temp 98.1°F | Resp 20

## 2017-08-08 DIAGNOSIS — D500 Iron deficiency anemia secondary to blood loss (chronic): Secondary | ICD-10-CM | POA: Diagnosis not present

## 2017-08-08 DIAGNOSIS — R6883 Chills (without fever): Secondary | ICD-10-CM | POA: Diagnosis not present

## 2017-08-08 DIAGNOSIS — R002 Palpitations: Secondary | ICD-10-CM | POA: Diagnosis not present

## 2017-08-08 DIAGNOSIS — N920 Excessive and frequent menstruation with regular cycle: Secondary | ICD-10-CM | POA: Diagnosis not present

## 2017-08-08 DIAGNOSIS — E559 Vitamin D deficiency, unspecified: Secondary | ICD-10-CM | POA: Diagnosis not present

## 2017-08-08 DIAGNOSIS — R42 Dizziness and giddiness: Secondary | ICD-10-CM | POA: Diagnosis not present

## 2017-08-08 DIAGNOSIS — R0609 Other forms of dyspnea: Secondary | ICD-10-CM | POA: Diagnosis not present

## 2017-08-08 DIAGNOSIS — R531 Weakness: Secondary | ICD-10-CM | POA: Diagnosis not present

## 2017-08-08 DIAGNOSIS — R5383 Other fatigue: Secondary | ICD-10-CM | POA: Diagnosis not present

## 2017-08-08 MED ORDER — SODIUM CHLORIDE 0.9 % IV SOLN
510.0000 mg | Freq: Once | INTRAVENOUS | Status: AC
  Administered 2017-08-08: 510 mg via INTRAVENOUS
  Filled 2017-08-08: qty 17

## 2017-08-08 MED ORDER — SODIUM CHLORIDE 0.9 % IV SOLN
Freq: Once | INTRAVENOUS | Status: AC
  Administered 2017-08-08: 15:00:00 via INTRAVENOUS

## 2017-08-08 NOTE — Patient Instructions (Signed)

## 2017-08-13 DIAGNOSIS — Z809 Family history of malignant neoplasm, unspecified: Secondary | ICD-10-CM | POA: Diagnosis not present

## 2017-08-13 DIAGNOSIS — N921 Excessive and frequent menstruation with irregular cycle: Secondary | ICD-10-CM | POA: Diagnosis not present

## 2017-08-15 ENCOUNTER — Encounter: Payer: Self-pay | Admitting: Internal Medicine

## 2017-08-15 ENCOUNTER — Other Ambulatory Visit (INDEPENDENT_AMBULATORY_CARE_PROVIDER_SITE_OTHER): Payer: 59

## 2017-08-15 ENCOUNTER — Inpatient Hospital Stay: Payer: 59

## 2017-08-15 ENCOUNTER — Ambulatory Visit: Payer: 59 | Admitting: Internal Medicine

## 2017-08-15 VITALS — BP 129/80

## 2017-08-15 VITALS — BP 100/72 | HR 96 | Ht 68.0 in | Wt 231.0 lb

## 2017-08-15 DIAGNOSIS — E538 Deficiency of other specified B group vitamins: Secondary | ICD-10-CM | POA: Diagnosis not present

## 2017-08-15 DIAGNOSIS — D500 Iron deficiency anemia secondary to blood loss (chronic): Secondary | ICD-10-CM | POA: Diagnosis not present

## 2017-08-15 DIAGNOSIS — R6883 Chills (without fever): Secondary | ICD-10-CM | POA: Diagnosis not present

## 2017-08-15 DIAGNOSIS — D509 Iron deficiency anemia, unspecified: Secondary | ICD-10-CM

## 2017-08-15 DIAGNOSIS — R42 Dizziness and giddiness: Secondary | ICD-10-CM | POA: Diagnosis not present

## 2017-08-15 DIAGNOSIS — R002 Palpitations: Secondary | ICD-10-CM | POA: Diagnosis not present

## 2017-08-15 DIAGNOSIS — R197 Diarrhea, unspecified: Secondary | ICD-10-CM

## 2017-08-15 DIAGNOSIS — R0609 Other forms of dyspnea: Secondary | ICD-10-CM | POA: Diagnosis not present

## 2017-08-15 DIAGNOSIS — R531 Weakness: Secondary | ICD-10-CM | POA: Diagnosis not present

## 2017-08-15 DIAGNOSIS — N920 Excessive and frequent menstruation with regular cycle: Secondary | ICD-10-CM | POA: Diagnosis not present

## 2017-08-15 DIAGNOSIS — E559 Vitamin D deficiency, unspecified: Secondary | ICD-10-CM | POA: Diagnosis not present

## 2017-08-15 DIAGNOSIS — R5383 Other fatigue: Secondary | ICD-10-CM | POA: Diagnosis not present

## 2017-08-15 LAB — IGA: IgA: 266 mg/dL (ref 68–378)

## 2017-08-15 MED ORDER — NA SULFATE-K SULFATE-MG SULF 17.5-3.13-1.6 GM/177ML PO SOLN
1.0000 | Freq: Once | ORAL | 0 refills | Status: AC
Start: 2017-08-15 — End: 2017-08-15

## 2017-08-15 MED ORDER — SODIUM CHLORIDE 0.9 % IV SOLN
INTRAVENOUS | Status: DC
  Administered 2017-08-15: 14:00:00 via INTRAVENOUS

## 2017-08-15 MED ORDER — SODIUM CHLORIDE 0.9 % IV SOLN
510.0000 mg | Freq: Once | INTRAVENOUS | Status: AC
  Administered 2017-08-15: 510 mg via INTRAVENOUS
  Filled 2017-08-15: qty 17

## 2017-08-15 MED FILL — SUPREP BOWEL PREP KIT: 17.5-3.13-1 | 1 days supply | Qty: 354 | Fill #0

## 2017-08-15 NOTE — Progress Notes (Signed)
HISTORY OF PRESENT ILLNESS:  [name] is a 42 y.o. 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 Lorelei Pont regarding iron deficiency anemia, B12 and vitamin D deficiency, and chronic intermittent diarrhea. Patient reports problems dating back to her teenage years when she had issues with postprandial urgency and loose stools. Typically 2 loose bowel movements and then she was okay. No issues with constipation. She continues to have similar problems though less frequent. She was noted to have iron deficiency anemia within the past 6 months. Hemoglobin 11.1. Iron saturation low. Ferritin low-normal. In February B12 level CXC. Decreased vitamin D as well. Now on supplementation. She denies melena or hematochezia. She did have some problems with reflux as manifested by heartburn with pregnancy but nothing currently. On no acid suppressive medication. She does have bloating and nausea without vomiting. Occasional minor rectal bleeding. She does use NSAIDs such as ibuprofen. No family history of colon cancer or inflammatory bowel disease. She has not been tested for celiac disease. Most recent CBC revealed hemoglobin of 12.4 with MCV 82.6. Last ferritin level at that time was 80 with iron saturation 13%. Intrinsic factor antibodies were negative. Abdominal ultrasound in 2017 revealed negative right upper quadrant including liver. CT scan of the abdomen and pelvis in 2015 to evaluate lower abdominal pain was unremarkable. Of importance, the patient does have regular and heavy menstrual periods. She does not donate blood.  REVIEW OF SYSTEMS:  All non-GI ROS negative unless otherwise stated in the history of present illness except for anxiety, fatigue, fevers, headaches, itching, menstrual pain, shortness of breath, sleeping problems  Past Medical History:  Diagnosis Date  . Anemia   . Anxiety   .  Chicken pox   . Depression   . GERD (gastroesophageal reflux disease)    tums prn-with pregnancy only  . Headache(784.0)   . IBS (irritable bowel syndrome)   . Migraines   . Placenta previa 2012   with current pregnancy-type and cross 2 Units per protocol  . PONV (postoperative nausea and vomiting)   . Shortness of breath dyspnea    with this pregnancy   . Syringomyelia, acquired [facility])     Past Surgical History:  Procedure Laterality Date  . APPENDECTOMY  2005  . arthroscopic knee  1990  . CESAREAN SECTION  12/27/2010   Procedure: CESAREAN SECTION;  Surgeon: Shon Millet II;  Location: [facility];  Service: Gynecology;  Laterality: N/A;  . CESAREAN SECTION N/A 02/03/2015   Procedure: CESAREAN SECTION;  Surgeon: Everlene Farrier, MD;  Location: [facility];  Service: Obstetrics;  Laterality: N/A;  . left breast reduction  1996  . TUBAL LIGATION Bilateral 02/03/2015   Procedure: BILATERAL TUBAL LIGATION;  Surgeon: Everlene Farrier, MD;  Location: [facility];  Service: Obstetrics;  Laterality: Bilateral;  . WISDOM TOOTH EXTRACTION      Social History [name]  reports that she has never smoked. She has never used smokeless tobacco. She reports that she does not drink alcohol or use drugs.  family history includes Alcohol abuse in her father; Arthritis in her father and mother; Clotting disorder in her mother; Colon polyps in her father; High blood pressure in her brother and mother; Hypertension in her mother; Irritable bowel syndrome in her mother; Other in her mother; Prostate cancer in her maternal grandfather.  Allergies  Allergen Reactions  . Topamax [Topiramate] Other (See  Comments)    Pt states that she has trouble recalling words when she takes this medication.       PHYSICAL EXAMINATION: Vital signs: BP 100/72   Pulse 96   Ht 5\' 8"  (1.727 m)   Wt 231 lb (104.8 kg)   BMI 35.12 kg/m   Constitutional: pleasant,generally well-appearing, no acute distress Psychiatric: alert  and oriented x3, cooperative Eyes: extraocular movements intact, anicteric, conjunctiva pink Mouth: oral pharynx moist, no lesions Neck: supple no lymphadenopathy Cardiovascular: heart regular rate and rhythm, no murmur Lungs: clear to auscultation bilaterally Abdomen: soft,obese, nontender, nondistended, no obvious ascites, no peritoneal signs, normal bowel sounds, no organomegaly Rectal:deferred until colonoscopy Extremities: no clubbing, cyanosis, or lower extremity edema bilaterally Skin: no lesions on visible extremities Neuro: No focal deficits. Normal DTRs. Cranial nerves intact  ASSESSMENT:  #1. Iron deficiency anemia. Rule out GI mucosal lesion with associated blood loss. Rule out celiac disease. Rule out menstrual blood loss. #2. B12 deficiency. No evidence for pernicious anemia. Other causes include pancreatic insufficiency, bacterial overgrowth, ileal disease such as Crohn's #3. Chronic intermittent problems with postprandial urgency and loose stools. Rule out IBD. Rule out IBS. Rule out microscopic colitis. Rule out celiac disease   PLAN:  #1. Screening serologies for celiac disease #2. Colonoscopy with biopsies and ileal intubation to evaluate iron deficiency anemia and chronic intermittent diarrhea.The nature of the procedure, as well as the risks, benefits, and alternatives were carefully and thoroughly reviewed with the patient. Ample time for discussion and questions allowed. The patient understood, was satisfied, and agreed to proceed. #3. Upper endoscopy with biopsies to evaluate iron deficiency anemia, vitamin deficiencies, and diarrhea.The nature of the procedure, as well as the risks, benefits, and alternatives were carefully and thoroughly reviewed with the patient. Ample time for discussion and questions allowed. The patient understood, was satisfied, and agreed to proceed. #4. Continue vitamin and mineral replacement with PCP  A copy of this consultation note has  been sent to Dr. Lorelei Pont

## 2017-08-15 NOTE — Patient Instructions (Signed)

## 2017-08-15 NOTE — Patient Instructions (Signed)
Your provider has requested that you go to the basement level for lab work before leaving today. Press "B" on the elevator. The lab is located at the first door on the left as you exit the elevator.  You have been scheduled for an endoscopy. Please follow written instructions given to you at your visit today. If you use inhalers (even only as needed), please bring them with you on the day of your procedure. Your physician has requested that you go to www.[email or web address].com and enter the access code given to you at your visit today. This web site gives a general overview about your procedure. However, you should still follow specific instructions given to you by our office regarding your preparation for the procedure.

## 2017-08-16 LAB — TISSUE TRANSGLUTAMINASE, IGA: (tTG) Ab, IgA: 1 U/mL

## 2017-09-10 ENCOUNTER — Inpatient Hospital Stay: Payer: 59

## 2017-09-10 ENCOUNTER — Inpatient Hospital Stay: Payer: 59 | Attending: Family | Admitting: Family

## 2017-09-10 ENCOUNTER — Encounter: Payer: Self-pay | Admitting: Family

## 2017-09-10 ENCOUNTER — Other Ambulatory Visit: Payer: Self-pay

## 2017-09-10 VITALS — BP 125/78 | HR 70 | Temp 98.0°F | Resp 16 | Wt 231.0 lb

## 2017-09-10 DIAGNOSIS — D500 Iron deficiency anemia secondary to blood loss (chronic): Secondary | ICD-10-CM | POA: Insufficient documentation

## 2017-09-10 DIAGNOSIS — Z79899 Other long term (current) drug therapy: Secondary | ICD-10-CM | POA: Diagnosis not present

## 2017-09-10 DIAGNOSIS — K921 Melena: Secondary | ICD-10-CM | POA: Diagnosis not present

## 2017-09-10 DIAGNOSIS — R5383 Other fatigue: Secondary | ICD-10-CM | POA: Insufficient documentation

## 2017-09-10 DIAGNOSIS — R0609 Other forms of dyspnea: Secondary | ICD-10-CM | POA: Insufficient documentation

## 2017-09-10 DIAGNOSIS — E559 Vitamin D deficiency, unspecified: Secondary | ICD-10-CM | POA: Diagnosis not present

## 2017-09-10 DIAGNOSIS — N920 Excessive and frequent menstruation with regular cycle: Secondary | ICD-10-CM | POA: Insufficient documentation

## 2017-09-10 LAB — CBC WITH DIFFERENTIAL (CANCER CENTER ONLY)
BASOS ABS: 0 10*3/uL (ref 0.0–0.1)
Basophils Relative: 0 %
EOS PCT: 1 %
Eosinophils Absolute: 0.1 10*3/uL (ref 0.0–0.5)
HEMATOCRIT: 43 % (ref 34.8–46.6)
Hemoglobin: 14.3 g/dL (ref 11.6–15.9)
LYMPHS PCT: 20 %
Lymphs Abs: 2 10*3/uL (ref 0.9–3.3)
MCH: 28.1 pg (ref 26.0–34.0)
MCHC: 33.3 g/dL (ref 32.0–36.0)
MCV: 84.6 fL (ref 81.0–101.0)
MONOS PCT: 7 %
Monocytes Absolute: 0.7 10*3/uL (ref 0.1–0.9)
Neutro Abs: 7 10*3/uL — ABNORMAL HIGH (ref 1.5–6.5)
Neutrophils Relative %: 72 %
PLATELETS: 333 10*3/uL (ref 145–400)
RBC: 5.08 MIL/uL (ref 3.70–5.32)
RDW: 17.4 % — AB (ref 11.1–15.7)
WBC Count: 9.8 10*3/uL (ref 3.9–10.0)

## 2017-09-10 LAB — RETICULOCYTES
RBC.: 5.01 MIL/uL (ref 3.70–5.45)
RETIC COUNT ABSOLUTE: 60.1 10*3/uL (ref 33.7–90.7)
Retic Ct Pct: 1.2 % (ref 0.7–2.1)

## 2017-09-10 MED FILL — VENLAFAXINE HCL ER 150 MG C: 150 | 90 days supply | Qty: 90 | Fill #1

## 2017-09-10 NOTE — Progress Notes (Signed)
Hematology and Oncology Follow Up Visit  [name] [id number] [birth year] 42 y.o. 09/10/2017   Principle Diagnosis:  Iron deficiency anemia Vitamin D deficiency   Current Therapy:   IV iron as indicated - last received in May 2018 Vitamin D 50,000 units PO once a week   Interim History:  [name] is here today for follow-up. She is symptomatic with fatigue, SOB with exertion and needing to nap more.  She had 2 episodes of blood in her stool while at the beach. Her cycle remains heavy as well.  No other bleeding, no bruising or petechiae.  She is scheduled for a colonoscopy with [name] in August.  No fever, chills, n/v, cough, rash, chest pain, palpitations, abdominal pain or changes in bowel or bladder habits.  No swelling or tenderness in her extremities. She has numbness and tingling in her feet at times.  No lymphadenopathy noted on exam.  She has a good appetite and is staying well hydrated. Her weight is stable.   ECOG Performance Status: 1 - Symptomatic but completely ambulatory  Medications:  Allergies as of 09/10/2017      Reactions   Topamax [topiramate] Other (See Comments)   Pt states that she has trouble recalling words when she takes this medication.      Medication List        Accurate as of 09/10/17 12:47 PM. Always use your most recent med list.          budesonide-formoterol 80-4.5 MCG/ACT inhaler Commonly known as:  SYMBICORT Inhale 2 puffs into the lungs 2 (two) times daily.   cyanocobalamin 1000 MCG/ML injection Commonly known as:  (VITAMIN B-12) Inject 1 mL (1,000 mcg total) into the muscle once a week.   ergocalciferol 50000 units capsule Commonly known as:  VITAMIN D2 Take 1 capsule (50,000 Units total) by mouth once a week.   ibuprofen 600 MG tablet Commonly known as:  ADVIL,MOTRIN Take 1 tablet (600 mg total) by mouth every 6 (six) hours as needed.   loratadine 10 MG tablet Commonly known as:  CLARITIN Take 1 tablet (10 mg  total) by mouth daily.   multivitamin tablet Take 1 tablet by mouth daily.   OTEZLA 30 MG Tabs Generic drug:  Apremilast Take 30 mg by mouth 2 (two) times daily.   venlafaxine XR 150 MG 24 hr capsule Commonly known as:  EFFEXOR-XR Take 1 capsule (150 mg total) by mouth daily with breakfast.       Allergies:  Allergies  Allergen Reactions  . Topamax [Topiramate] Other (See Comments)    Pt states that she has trouble recalling words when she takes this medication.    Past Medical History, Surgical history, Social history, and Family History were reviewed and updated.  Review of Systems: All other 10 point review of systems is negative.   Physical Exam:  weight is 231 lb (104.8 kg). Her oral temperature is 98 F (36.7 C). Her blood pressure is 125/78 and her pulse is 70. Her respiration is 16 and oxygen saturation is 100%.   Wt Readings from Last 3 Encounters:  09/10/17 231 lb (104.8 kg)  08/15/17 231 lb (104.8 kg)  07/30/17 230 lb (104.3 kg)    Ocular: Sclerae unicteric, pupils equal, round and reactive to light Ear-nose-throat: Oropharynx clear, dentition fair Lymphatic: No cervical, supraclavicular or axillary adenopathy Lungs no rales or rhonchi, good excursion bilaterally Heart regular rate and rhythm, no murmur appreciated Abd soft, nontender, positive bowel sounds, no liver or  spleen tip palpated on exam, no fluid wave  MSK no focal spinal tenderness, no joint edema Neuro: non-focal, well-oriented, appropriate affect Breasts: Deferred   Lab Results  Component Value Date   WBC 9.8 09/10/2017   HGB 14.3 09/10/2017   HCT 43.0 09/10/2017   MCV 84.6 09/10/2017   PLT 333 09/10/2017   Lab Results  Component Value Date   FERRITIN 80 07/30/2017   IRON 35 (L) 07/30/2017   TIBC 273 07/30/2017   UIBC 238 07/30/2017   IRONPCTSAT 13 (L) 07/30/2017   Lab Results  Component Value Date   RETICCTPCT 1.5 07/30/2017   RBC 5.08 09/10/2017   No results found for:  [facility] Lab Results  Component Value Date   IGGSERUM 1,160 06/27/2012   IGA 266 08/15/2017   IGMSERUM 69 06/27/2012   Lab Results  Component Value Date   TOTALPROTELP 7.0 06/27/2012     Chemistry      Component Value Date/Time   NA 138 06/22/2017 1010   K 3.7 06/22/2017 1010   CL 103 06/22/2017 1010   CO2 28 06/22/2017 1010   BUN 7 06/22/2017 1010   CREATININE 0.75 06/22/2017 1010      Component Value Date/Time   CALCIUM 9.5 06/22/2017 1010   ALKPHOS 110 06/22/2017 1010   AST 19 06/22/2017 1010   ALT 24 06/22/2017 1010   BILITOT 0.4 06/22/2017 1010      Impression and Plan: [name] is a very pleasant 42 yo caucasian female with iron deficiency anemia secondary to heavy cycles and Vitamin D deficiency.  She is symptomatic as mentioned above. We will see what her iron studies show and bring her back in for infusion if needed.  We will see what her iron studies show and then schedule her follow-up.  She will contact our office with any questions or concerns. We can certainly see her sooner if need be.   Laverna Peace, NP 7/22/201912:47 PM

## 2017-09-11 LAB — IRON AND TIBC
IRON: 73 ug/dL (ref 41–142)
Saturation Ratios: 29 % (ref 21–57)
TIBC: 254 ug/dL (ref 236–444)
UIBC: 181 ug/dL

## 2017-09-11 LAB — VITAMIN D 25 HYDROXY (VIT D DEFICIENCY, FRACTURES): Vit D, 25-Hydroxy: 24.9 ng/mL — ABNORMAL LOW (ref 30.0–100.0)

## 2017-09-11 LAB — FERRITIN: Ferritin: 293 ng/mL (ref 11–307)

## 2017-09-13 ENCOUNTER — Telehealth: Payer: Self-pay | Admitting: *Deleted

## 2017-09-13 NOTE — Telephone Encounter (Addendum)
Patient is aware of results. She will begin  Puncturing the tablet and placing oil under her tongue.   ----- Message from Eliezer Bottom, NP sent at 09/12/2017  9:36 AM EDT ----- Regarding: Vit D Iron studies look good. Vit D is still low. Is she taking her supplement? If so is she puncturing the capsule and putting under tongue?  Sarah  ----- Message ----- From: Buel Ream, Lab In [place] Sent: 09/10/2017  11:27 AM To: Eliezer Bottom, NP

## 2017-09-18 MED FILL — CYANOCOBALAMIN 1,000 MCG/ML: 1000 | 28 days supply | Qty: 4 | Fill #3

## 2017-10-03 ENCOUNTER — Encounter: Payer: Self-pay | Admitting: Internal Medicine

## 2017-10-10 ENCOUNTER — Ambulatory Visit (AMBULATORY_SURGERY_CENTER): Payer: 59 | Admitting: Internal Medicine

## 2017-10-10 ENCOUNTER — Encounter: Payer: Self-pay | Admitting: Internal Medicine

## 2017-10-10 VITALS — BP 117/67 | HR 65 | Temp 98.9°F | Resp 22 | Ht 68.0 in | Wt 231.0 lb

## 2017-10-10 DIAGNOSIS — D509 Iron deficiency anemia, unspecified: Secondary | ICD-10-CM | POA: Diagnosis not present

## 2017-10-10 DIAGNOSIS — R197 Diarrhea, unspecified: Secondary | ICD-10-CM | POA: Diagnosis not present

## 2017-10-10 DIAGNOSIS — Z1211 Encounter for screening for malignant neoplasm of colon: Secondary | ICD-10-CM | POA: Diagnosis not present

## 2017-10-10 MED ORDER — SODIUM CHLORIDE 0.9 % IV SOLN: 500.0000 mL | Freq: Once | INTRAVENOUS | Status: DC

## 2017-10-10 NOTE — Patient Instructions (Signed)
TAKE A DAILY IRON SUPPLEMENT  YOU HAD AN ENDOSCOPIC PROCEDURE TODAY AT [facility]:   Refer to the procedure report that was given to you for any specific questions about what was found during the examination.  If the procedure report does not answer your questions, please call your gastroenterologist to clarify.  If you requested that your care partner not be given the details of your procedure findings, then the procedure report has been included in a sealed envelope for you to review at your convenience later.  YOU SHOULD EXPECT: Some feelings of bloating in the abdomen. Passage of more gas than usual.  Walking can help get rid of the air that was put into your GI tract during the procedure and reduce the bloating. If you had a lower endoscopy (such as a colonoscopy or flexible sigmoidoscopy) you may notice spotting of blood in your stool or on the toilet paper. If you underwent a bowel prep for your procedure, you may not have a normal bowel movement for a few days.  Please Note:  You might notice some irritation and congestion in your nose or some drainage.  This is from the oxygen used during your procedure.  There is no need for concern and it should clear up in a day or so.  SYMPTOMS TO REPORT IMMEDIATELY:   Following lower endoscopy (colonoscopy or flexible sigmoidoscopy):  Excessive amounts of blood in the stool  Significant tenderness or worsening of abdominal pains  Swelling of the abdomen that is new, acute  Fever of 100F or higher   Following upper endoscopy (EGD)  Vomiting of blood or coffee ground material  New chest pain or pain under the shoulder blades  Painful or persistently difficult swallowing  New shortness of breath  Fever of 100F or higher  Black, tarry-looking stools  For urgent or emergent issues, a gastroenterologist can be reached at any hour by calling [phone].   DIET:  We do recommend a small meal at first, but then you may  proceed to your regular diet.  Drink plenty of fluids but you should avoid alcoholic beverages for 24 hours.  ACTIVITY:  You should plan to take it easy for the rest of today and you should NOT DRIVE or use heavy machinery until tomorrow (because of the sedation medicines used during the test).    FOLLOW UP: Our staff will call the number listed on your records the next business day following your procedure to check on you and address any questions or concerns that you may have regarding the information given to you following your procedure. If we do not reach you, we will leave a message.  However, if you are feeling well and you are not experiencing any problems, there is no need to return our call.  We will assume that you have returned to your regular daily activities without incident.  If any biopsies were taken you will be contacted by phone or by letter within the next 1-3 weeks.  Please call us at [phone] if you have not heard about the biopsies in 3 weeks.    SIGNATURES/CONFIDENTIALITY: You and/or your care partner have signed paperwork which will be entered into your electronic medical record.  These signatures attest to the fact that that the information above on your After Visit Summary has been reviewed and is understood.  Full responsibility of the confidentiality of this discharge information lies with you and/or your care-partner.

## 2017-10-10 NOTE — Progress Notes (Signed)
A and O x3. Report to RN. Tolerated MAC anesthesia well.Teeth unchanged after procedure.

## 2017-10-10 NOTE — Op Note (Signed)
[facility] Patient Name: [name] Procedure Date: 10/10/2017 2:39 PM MRN: [id number] Endoscopist: Docia Chuck. Henrene Pastor , MD Age: 42 Referring MD:  Date of Birth: [birth year] Gender: Female Account #: [id number] Procedure:                Colonoscopy Indications:              Iron deficiency anemia Medicines:                Monitored Anesthesia Care Procedure:                Pre-Anesthesia Assessment:                           - Prior to the procedure, a History and Physical                            was performed, and patient medications and                            allergies were reviewed. The patient's tolerance of                            previous anesthesia was also reviewed. The risks                            and benefits of the procedure and the sedation                            options and risks were discussed with the patient.                            All questions were answered, and informed consent                            was obtained. Prior Anticoagulants: The patient has                            taken no previous anticoagulant or antiplatelet                            agents. ASA Grade Assessment: II - A patient with                            mild systemic disease. After reviewing the risks                            and benefits, the patient was deemed in                            satisfactory condition to undergo the procedure.                           After obtaining informed consent, the colonoscope  was passed under direct vision. Throughout the                            procedure, the patient's blood pressure, pulse, and                            oxygen saturations were monitored continuously. The                            Model CF-HQ190L [id number]) scope was introduced                            through the anus and advanced to the the cecum,                            identified by appendiceal orifice and  ileocecal                            valve. The terminal ileum, ileocecal valve,                            appendiceal orifice, and rectum were photographed.                            The quality of the bowel preparation was excellent.                            The colonoscopy was performed without difficulty.                            The patient tolerated the procedure well. The bowel                            preparation used was SUPREP. Scope In: 2:43:18 PM Scope Out: 2:57:51 PM Scope Withdrawal Time: 0 hours 11 minutes 14 seconds  Total Procedure Duration: 0 hours 14 minutes 33 seconds  Findings:                 The terminal ileum appeared normal.                           The entire examined colon appeared normal on direct                            and retroflexion views. Complications:            No immediate complications. Estimated blood loss:                            None. Estimated Blood Loss:     Estimated blood loss: none. Impression:               - The examined portion of the ileum was normal.                           - The  entire examined colon is normal on direct and                            retroflexion views.                           - No specimens collected. Recommendation:           - Repeat colonoscopy in 10 years for screening                            purposes.                           - Patient has a contact number available for                            emergencies. The signs and symptoms of potential                            delayed complications were discussed with the                            patient. Return to normal activities tomorrow.                            Written discharge instructions were provided to the                            patient.                           - Resume previous diet.                           - Continue present medications.                           - EGD today. Please see report Docia Chuck. Henrene Pastor,  MD 10/10/2017 3:01:11 PM This report has been signed electronically.

## 2017-10-10 NOTE — Op Note (Signed)
[facility] Patient Name: [name] Procedure Date: 10/10/2017 2:38 PM MRN: [id number] Endoscopist: Docia Chuck. Henrene Pastor , MD Age: 42 Referring MD:  Date of Birth: 02-Apr-1975 Gender: Female Account #: [id number] Procedure:                Upper GI endoscopy Indications:              Iron deficiency anemia Medicines:                Monitored Anesthesia Care Procedure:                Pre-Anesthesia Assessment:                           - Prior to the procedure, a History and Physical                            was performed, and patient medications and                            allergies were reviewed. The patient's tolerance of                            previous anesthesia was also reviewed. The risks                            and benefits of the procedure and the sedation                            options and risks were discussed with the patient.                            All questions were answered, and informed consent                            was obtained. Prior Anticoagulants: The patient has                            taken no previous anticoagulant or antiplatelet                            agents. ASA Grade Assessment: II - A patient with                            mild systemic disease. After reviewing the risks                            and benefits, the patient was deemed in                            satisfactory condition to undergo the procedure.                           After obtaining informed consent, the endoscope was  passed under direct vision. Throughout the                            procedure, the patient's blood pressure, pulse, and                            oxygen saturations were monitored continuously. The                            Endoscope was introduced through the mouth, and                            advanced to the second part of duodenum. The upper                            GI endoscopy was accomplished  without difficulty.                            The patient tolerated the procedure well. Scope In: Scope Out: Findings:                 The esophagus was normal.                           The stomach was normal.                           The examined duodenum was normal.                           The cardia and gastric fundus were normal on                            retroflexion. Complications:            No immediate complications. Estimated Blood Loss:     Estimated blood loss: none. Impression:               - Normal esophagus.                           - Normal stomach.                           - Normal examined duodenum.                           - No specimens collected. Recommendation:           - Patient has a contact number available for                            emergencies. The signs and symptoms of potential                            delayed complications were discussed with the  patient. Return to normal activities tomorrow.                            Written discharge instructions were provided to the                            patient.                           - Resume previous diet.                           - Continue present medications.                           - Recommend daily iron supplementation with ongoing                            menstruation                           - To the care of Dr. Chales Salmon. Henrene Pastor, MD 10/10/2017 3:12:18 PM This report has been signed electronically.

## 2017-10-11 ENCOUNTER — Telehealth: Payer: Self-pay | Admitting: *Deleted

## 2017-10-11 NOTE — Telephone Encounter (Signed)
  Follow up Call-  Call back number 10/10/2017  Post procedure Call Back phone  # [phone]  Permission to leave phone message Yes  Some recent data might be hidden     Patient questions:  Do you have a fever, pain , or abdominal swelling? No. Pain Score  0 *  Have you tolerated food without any problems? Yes.    Have you been able to return to your normal activities? Yes.    Do you have any questions about your discharge instructions: Diet   No. Medications  No. Follow up visit  No.  Do you have questions or concerns about your Care? No.  Actions: * If pain score is 4 or above: No action needed, pain <4.

## 2017-10-15 ENCOUNTER — Telehealth: Payer: 59 | Admitting: Family

## 2017-10-15 DIAGNOSIS — H65191 Other acute nonsuppurative otitis media, right ear: Secondary | ICD-10-CM

## 2017-10-15 MED ORDER — CIPROFLOXACIN HCL 500 MG PO TABS: 500.0000 mg | ORAL_TABLET | Freq: Two times a day (BID) | ORAL | 0 refills | Status: DC

## 2017-10-15 NOTE — Progress Notes (Signed)
E Visit for Swimmer's Ear  We are sorry that you are not feeling well. Here is how we plan to help!  Based on what you have shared with me it looks like you have swimmers ear. Swimmer's ear is a redness or swelling, irritation, or infection of your outer ear canal.  These symptoms usually occur within a few days of swimming.  Your ear canal is a tube that goes from the opening of the ear to the eardrum.  When water stays in your ear canal, germs can grow.  This is a painful condition that often happens to children and swimmers of all ages.  It is not contagious and oral antibiotics are not required to treat uncomplicated swimmer's ear.  The usual symptoms include: Itching inside the ear, Redness or a sense of swelling in the ear, Pain when the ear is tugged on when pressure is placed on the ear, Pus draining from the infected ear.  Cipro 500mg , one tablet my mouth twice a day for 10 days  In certain cases swimmer's ear may progress to a more serious bacterial infection of the middle or inner ear.  If you have a fever 102 and up and significantly worsening symptoms, this could indicate a more serious infection moving to the middle/inner and needs face to face evaluation in an office by a provider.  Your symptoms should improve over the next 3 days and should resolve in about 7 days.  HOME CARE:   Wash your hands frequently.  Do not place the tip of the bottle on your ear or touch it with your fingers.  You can take Acetominophen 650 mg every 4-6 hours as needed for pain.  If pain is severe or moderate, you can apply a heating pad (set on low) or hot water bottle (wrapped in a towel) to outer ear for 20 minutes.  This will also increase drainage.  Avoid ear plugs  Do not use Q-tips  After showers, help the water run out by tilting your head to one side.  GET HELP RIGHT AWAY IF:   Fever is over 102.2 degrees.  You develop progressive ear pain or hearing loss.  Ear symptoms persist  longer than 3 days after treatment.  MAKE SURE YOU:   Understand these instructions.  Will watch your condition.  Will get help right away if you are not doing well or get worse.  TO PREVENT SWIMMER'S EAR:  Use a bathing cap or custom fitted swim molds to keep your ears dry.  Towel off after swimming to dry your ears.  Tilt your head or pull your earlobes to allow the water to escape your ear canal.  If there is still water in your ears, consider using a hairdryer on the lowest setting.  Thank you for choosing an e-visit. Your e-visit answers were reviewed by a board certified advanced clinical practitioner to complete your personal care plan. Depending upon the condition, your plan could have included both over the counter or prescription medications. Please review your pharmacy choice. Be sure that the pharmacy you have chosen is open so that you can pick up your prescription now.  If there is a problem you may message your provider in [place] to have the prescription routed to another pharmacy. Your safety is important to [place]. If you have drug allergies check your prescription carefully.  For the next 24 hours, you can use MyChart to ask questions about today's visit, request a non-urgent call back, or ask for  a work or school excuse from your e-visit provider. You will get an email in the next two days asking about your experience. I hope that your e-visit has been valuable and will speed your recovery.

## 2017-10-17 DIAGNOSIS — L408 Other psoriasis: Secondary | ICD-10-CM | POA: Diagnosis not present

## 2017-10-17 DIAGNOSIS — L700 Acne vulgaris: Secondary | ICD-10-CM | POA: Diagnosis not present

## 2017-10-17 DIAGNOSIS — D225 Melanocytic nevi of trunk: Secondary | ICD-10-CM | POA: Diagnosis not present

## 2017-10-17 MED FILL — CLINDAMYCIN PH 1% SOLUTION: 1 | 30 days supply | Qty: 60 | Fill #0

## 2017-10-18 MED FILL — TRETINOIN 0.025% CREAM: 0.025 | 30 days supply | Qty: 45 | Fill #0

## 2017-11-06 ENCOUNTER — Encounter: Payer: Self-pay | Admitting: Family Medicine

## 2017-11-12 MED FILL — CYANOCOBALAMIN 1,000 MCG/ML: 1000 | 28 days supply | Qty: 4 | Fill #4

## 2017-11-27 ENCOUNTER — Telehealth: Payer: Self-pay | Admitting: Pharmacist

## 2017-11-27 MED FILL — OTEZLA 30 MG TABS: 30 | 30 days supply | Qty: 60 | Fill #0

## 2017-11-27 NOTE — Telephone Encounter (Signed)
Called patient to schedule an appointment for the [facility]. I was unable to reach the patient so I left a HIPAA-compliant message requesting that the patient return my call.   

## 2017-12-04 NOTE — Telephone Encounter (Signed)
Called patient to schedule an appointment for the [facility]. I was unable to reach the patient so I left a HIPAA-compliant message requesting that the patient return my call. Second attempt.  

## 2017-12-18 NOTE — Telephone Encounter (Signed)
Called patient to schedule an appointment for the [facility]. I was unable to reach the patient so I left a HIPAA-compliant message requesting that the patient return my call. Third attempt

## 2017-12-25 ENCOUNTER — Ambulatory Visit (INDEPENDENT_AMBULATORY_CARE_PROVIDER_SITE_OTHER): Payer: 59 | Admitting: Psychology

## 2017-12-25 DIAGNOSIS — F4323 Adjustment disorder with mixed anxiety and depressed mood: Secondary | ICD-10-CM | POA: Diagnosis not present

## 2018-01-08 MED FILL — VENLAFAXINE HCL ER 150 MG C: 150 | 90 days supply | Qty: 90 | Fill #2

## 2018-01-25 ENCOUNTER — Ambulatory Visit: Payer: 59 | Admitting: Psychology

## 2018-01-25 DIAGNOSIS — F4323 Adjustment disorder with mixed anxiety and depressed mood: Secondary | ICD-10-CM

## 2018-02-06 ENCOUNTER — Ambulatory Visit: Payer: 59 | Admitting: Psychology

## 2018-02-19 ENCOUNTER — Emergency Department
Admission: EM | Admit: 2018-02-19 | Discharge: 2018-02-19 | Disposition: A | Discharge status: Home / Self Care | Payer: 59 | Source: Home / Self Care

## 2018-02-19 ENCOUNTER — Telehealth: Payer: Self-pay | Admitting: Emergency Medicine

## 2018-02-19 ENCOUNTER — Other Ambulatory Visit: Payer: Self-pay

## 2018-02-19 ENCOUNTER — Encounter: Payer: Self-pay | Admitting: *Deleted

## 2018-02-19 DIAGNOSIS — R05 Cough: Secondary | ICD-10-CM | POA: Diagnosis not present

## 2018-02-19 DIAGNOSIS — R059 Cough, unspecified: Secondary | ICD-10-CM

## 2018-02-19 DIAGNOSIS — R058 Other specified cough: Secondary | ICD-10-CM

## 2018-02-19 MED ORDER — HYDROCODONE-HOMATROPINE 5-1.5 MG PO TABS: ORAL_TABLET | ORAL | 0 refills | Status: DC

## 2018-02-19 MED ORDER — AZITHROMYCIN 250 MG PO TABS: ORAL_TABLET | ORAL | 0 refills | Status: DC

## 2018-02-19 MED ORDER — PREDNISONE 20 MG PO TABS: ORAL_TABLET | ORAL | 0 refills | Status: DC

## 2018-02-19 NOTE — Discharge Instructions (Addendum)
Drink plenty of fluids and get enough rest  Take the prednisone tapered dose 60 mg for 2 days 40 mg for 2 days 20 mg for 2 days.  Use the Hycodan cough syrup if needed for bad coughing and chest aching.  You can use DM-containing cough syrups in the daytime which are probably a little less sedating than the Hycodan.  Once again I urged you to get your flu shot.  If you are not improving over the next several days get the prescription for the azithromycin filled and take it.  If you do not use it please destroy it.  It is not good to keep antibiotics around for optional use.

## 2018-02-19 NOTE — ED Provider Notes (Signed)
[facility]    CSN: [id number] Arrival date & time: 02/19/18  1422     History   Chief Complaint Chief Complaint  Patient presents with  . Cough    HPI [name] is a 42 y.o. female.   HPI Patient is here complaining of a cough that is been going on since early November.  She has been taking OTC medications but it is not getting better.  The last couple of days seem a little worse.  She has had these kind of problems often in the wintertime.  She says usually takes course of prednisone to break the cycle.  She has not been wheezing a lot.  She does not smoke.  No ear pain.  Minimal sore throat.  Minimal nasal congestion/sinus pressure.  No fever.  No nausea or vomiting.  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  She has a husband and 3 children and they have had some little things but nothing quite like this.  She did not get her flu shot.  She does not take flu shots she says.  Urged her to do so. Past Medical History:  Diagnosis Date  . Anemia   . Anxiety   . Chicken pox   . Depression   . GERD (gastroesophageal reflux disease)    tums prn-with pregnancy only  . Headache(784.0)   . IBS (irritable bowel syndrome)   . Migraines   . Placenta previa 2012   with current pregnancy-type and cross 2 Units per protocol  . PONV (postoperative nausea and vomiting)   . Shortness of breath dyspnea    with this pregnancy   . Syringomyelia, acquired [facility])     Patient Active Problem List   Diagnosis Date Noted  . IDA (iron deficiency anemia) 06/26/2017  . Iron deficiency anemia due to chronic blood loss 06/26/2017  . B12 deficiency 03/30/2017  . Asthma 12/08/2016  . S/P cesarean section 02/03/2015  . Low back pain 04/24/2014  . Chest pain 01/01/2014  . Scalp lesion 01/01/2014  . Wheezing 11/19/2013  . Allergic rhinitis 11/11/2013  . Sinusitis, acute maxillary 11/11/2013  . Daytime somnolence 09/04/2013  . Abdominal pain, other specified  site 08/21/2013  . Syringomyelia ([place]) 05/09/2013  . Mass 05/09/2013  . Paresthesia 07/19/2012  . Depression 07/19/2012  . Sensory disturbance 06/27/2012  . Leg weakness 06/27/2012  . Leukocytosis 06/27/2012  . Microcytic anemia 06/27/2012    Past Surgical History:  Procedure Laterality Date  . APPENDECTOMY  2005  . arthroscopic knee  1990  . CESAREAN SECTION  12/27/2010   Procedure: CESAREAN SECTION;  Surgeon: Shon Millet II;  Location: [facility];  Service: Gynecology;  Laterality: N/A;  . CESAREAN SECTION N/A 02/03/2015   Procedure: CESAREAN SECTION;  Surgeon: Everlene Farrier, MD;  Location: [facility];  Service: Obstetrics;  Laterality: N/A;  . left breast reduction  1996  . TUBAL LIGATION Bilateral 02/03/2015   Procedure: BILATERAL TUBAL LIGATION;  Surgeon: Everlene Farrier, MD;  Location: [facility];  Service: Obstetrics;  Laterality: Bilateral;  . WISDOM TOOTH EXTRACTION      OB History    Gravida  3   Para  3   Term  2   Preterm  1   AB      Living  3     SAB      TAB      Ectopic      Multiple  0   Live Births  3            Home Medications    Prior to Admission medications   Medication Sig Start Date End Date Taking? Authorizing Provider  Apremilast (OTEZLA) 30 MG TABS Take 30 mg by mouth 2 (two) times daily.     [provider]  azithromycin (ZITHROMAX) 250 MG tablet Take 2 tabs PO x 1 dose, then 1 tab PO QD x 4 days 02/19/18   Posey Boyer, MD  budesonide-formoterol [facility]) 80-4.5 MCG/ACT inhaler Inhale 2 puffs into the lungs 2 (two) times daily. 12/08/16   Debbrah Alar, NP  cyanocobalamin (,VITAMIN B-12,) 1000 MCG/ML injection Inject 1 mL (1,000 mcg total) into the muscle once a week. 06/01/17   Copland, Gay Filler, MD  ergocalciferol (VITAMIN D2) 50000 units capsule Take 1 capsule (50,000 Units total) by mouth once a week. 06/26/17   [place], Holli Humbles, NP  HYDROcodone-Homatropine 5-1.5 MG TABS Take 1 teaspoon (5 mL's) every 4-6  hours as needed for cough. 02/19/18   Posey Boyer, MD  ibuprofen (ADVIL,MOTRIN) 600 MG tablet Take 1 tablet (600 mg total) by mouth every 6 (six) hours as needed. 02/06/15   Molli Posey, MD  loratadine (CLARITIN) 10 MG tablet Take 1 tablet (10 mg total) by mouth daily. 12/08/16   Debbrah Alar, NP  Multiple Vitamin (MULTIVITAMIN) tablet Take 1 tablet by mouth daily.    [provider]  predniSONE (DELTASONE) 20 MG tablet Take 3 pills daily for 2 days, then 2 daily for 2 days, then 1 daily for 2 days for lung inflammation 02/19/18   Posey Boyer, MD  venlafaxine XR (EFFEXOR-XR) 150 MG 24 hr capsule Take 1 capsule (150 mg total) by mouth daily with breakfast. 06/04/17   Copland, Gay Filler, MD    Family History Family History  Problem Relation Age of Onset  . High blood pressure Mother   . Arthritis Mother   . Hypertension Mother   . Other Mother        "Multisystem atrophy"  . Clotting disorder Mother   . Irritable bowel syndrome Mother   . Alcohol abuse Father   . Arthritis Father   . Colon polyps Father   . High blood pressure Brother   . Prostate cancer Maternal Grandfather   . Colon cancer Maternal Uncle   . Stomach cancer Neg Hx     Social History Social History   Tobacco Use  . Smoking status: Never Smoker  . Smokeless tobacco: Never Used  Substance Use Topics  . Alcohol use: No    Comment: rarely, but not while preg  . Drug use: No     Allergies   Topamax [topiramate]   Review of Systems Review of Systems Constitutional: Unremarkable Respiratory: As noted above HEENT: As noted above Cardiovascular: Unremarkable GI: Unremarkable  Physical Exam Triage Vital Signs ED Triage Vitals  Enc Vitals Group     BP 02/19/18 1455 135/86     Pulse Rate 02/19/18 1455 91     Resp 02/19/18 1455 18     Temp 02/19/18 1455 98.3 F (36.8 C)     Temp Source 02/19/18 1455 Oral     SpO2 02/19/18 1455 98 %     Weight 02/19/18 1456 226 lb (102.5 kg)       Height 02/19/18 1456 5\' 8"  (1.727 m)     Head Circumference --      Peak Flow --      Pain Score 02/19/18 1456 0  Pain Loc --      Pain Edu? --      Excl. in [place]? --    No data found.  Updated Vital Signs BP 135/86 (BP Location: Right Arm)   Pulse 91   Temp 98.3 F (36.8 C) (Oral)   Resp 18   Ht 5\' 8"  (1.727 m)   Wt 102.5 kg   LMP 02/15/2018   SpO2 98%   BMI 34.36 kg/m   Visual Acuity Right Eye Distance:   Left Eye Distance:   Bilateral Distance:    Right Eye Near:   Left Eye Near:    Bilateral Near:     Physical Exam Pleasant lady, alert and oriented.  TMs normal.  Nose does not seem terribly congested.  Throat clear.  Neck supple without nodes.  Chest clear to auscultation.  Heart regular without murmur.  UC Treatments / Results  Labs (all labs ordered are listed, but only abnormal results are displayed) Labs Reviewed - No data to display  EKG None  Radiology No results found.  Procedures Procedures (including critical care time)  Medications Ordered in UC Medications - No data to display  Initial Impression / Assessment and Plan / UC Course  I have reviewed the triage vital signs and the nursing notes.  Pertinent labs & imaging results that were available during my care of the patient were reviewed by me and considered in my medical decision making (see chart for details).     Post viral cough syndrome.  See instructions. Final Clinical Impressions(s) / UC Diagnoses   Final diagnoses:  Post-viral cough syndrome  Cough     Discharge Instructions     Drink plenty of fluids and get enough rest  Take the prednisone tapered dose 60 mg for 2 days 40 mg for 2 days 20 mg for 2 days.  Use the Hycodan cough syrup if needed for bad coughing and chest aching.  You can use DM-containing cough syrups in the daytime which are probably a little less sedating than the Hycodan.  Once again I urged you to get your flu shot.  If you are not  improving over the next several days get the prescription for the azithromycin filled and take it.  If you do not use it please destroy it.  It is not good to keep antibiotics around for optional use.    ED Prescriptions    Medication Sig Dispense Auth. Provider   azithromycin (ZITHROMAX) 250 MG tablet Take 2 tabs PO x 1 dose, then 1 tab PO QD x 4 days 6 tablet Posey Boyer, MD   HYDROcodone-Homatropine 5-1.5 MG TABS Take 1 teaspoon (5 mL's) every 4-6 hours as needed for cough. 56 each Posey Boyer, MD   predniSONE (DELTASONE) 20 MG tablet Take 3 pills daily for 2 days, then 2 daily for 2 days, then 1 daily for 2 days for lung inflammation 12 tablet Posey Boyer, MD     Controlled Substance Prescriptions [place] Controlled Substance Registry consulted? No   Posey Boyer, MD 02/19/18 [phone]

## 2018-02-19 NOTE — ED Triage Notes (Signed)
Pt c/o intermittent cough x 2 mths, worse x 1 wk. She now c/o non-productive cough, chest hurts, and sinus pain. She has taken Delsym, Flonase and Sudafed.

## 2018-03-05 NOTE — Progress Notes (Addendum)
[facility] [address], [place], [place] [address] [phone]  Date:  03/07/2018   Name:  [name]   DOB:  1975/03/16   MRN:  [id number]  PCP:  Darreld Mclean, MD    Chief Complaint: Cough (started october, prednisone, zpack, chest and back pain on right side, fatigue)   History of Present Illness:  [name] is a 43 y.o. very pleasant female patient who presents with the following:  So far I have seen this patient once previously, in March 2019.  She has history of B12 deficiency, depression, IBS, and migraine. She is a neurology patient for her migraine headaches  Here today to discuss a cough with back pain She notes a cough since the start of November, this tends to happen every fall.   Recently seen at urgent care by Dr. Linna Darner, on December 31.  She was diagnosed with a post viral cough syndrome, and was given a prednisone taper and Hycodan syrup.  Dr. Linna Darner encouraged her to get her flu shot, and gave her a prescription for azithromycin to use if she did not get better. She did end up taking the zpack 4 days later.   The cough is a bit better, but still "lingering" and she notes pain in her right chest and right upper mid back with cough  No fever noted She feels tired She may feel hot and hold sometimes  She was on symbicort a while ago but is not taking it now  She has tried claritin Never been to see pulmonology  She notes a "lingering bronchitis every fall that lasts forever" She does not feel like the pred really helped at all this time No wheezing noted   She is s/p BTL  She is on otezla for her psoriasis and it does help Last iron infusion was done in June of 2019  Her OBG is [facility]. They want to do a hyst she is not a good ablation candidate -apparently she has some abnormal cells.  Heidi has not had time to undergo a hysterectomy yet, but knows that this will probably be  necessary due to her menorrhagia  Lab Results  Component Value Date   WBC 9.8 09/10/2017   HGB 14.3 09/10/2017   HCT 43.0 09/10/2017   MCV 84.6 09/10/2017   PLT 333 09/10/2017     Patient Active Problem List   Diagnosis Date Noted  . IDA (iron deficiency anemia) 06/26/2017  . Iron deficiency anemia due to chronic blood loss 06/26/2017  . B12 deficiency 03/30/2017  . Asthma 12/08/2016  . Scalp lesion 01/01/2014  . Allergic rhinitis 11/11/2013  . Daytime somnolence 09/04/2013  . Syringomyelia ([place]) 05/09/2013  . Mass 05/09/2013  . Paresthesia 07/19/2012  . Depression 07/19/2012  . Sensory disturbance 06/27/2012  . Leg weakness 06/27/2012  . Leukocytosis 06/27/2012  . Microcytic anemia 06/27/2012    Past Medical History:  Diagnosis Date  . Anemia   . Anxiety   . Chicken pox   . Depression   . GERD (gastroesophageal reflux disease)    tums prn-with pregnancy only  . Headache(784.0)   . IBS (irritable bowel syndrome)   . Migraines   . Placenta previa 2012   with current pregnancy-type and cross 2 Units per protocol  . PONV (postoperative nausea and vomiting)   . Shortness of breath dyspnea    with this pregnancy   . Syringomyelia, acquired (  [facility])     Past Surgical History:  Procedure Laterality Date  . APPENDECTOMY  2005  . arthroscopic knee  1990  . CESAREAN SECTION  12/27/2010   Procedure: CESAREAN SECTION;  Surgeon: Shon Millet II;  Location: [facility];  Service: Gynecology;  Laterality: N/A;  . CESAREAN SECTION N/A 02/03/2015   Procedure: CESAREAN SECTION;  Surgeon: Everlene Farrier, MD;  Location: [facility];  Service: Obstetrics;  Laterality: N/A;  . left breast reduction  1996  . TUBAL LIGATION Bilateral 02/03/2015   Procedure: BILATERAL TUBAL LIGATION;  Surgeon: Everlene Farrier, MD;  Location: [facility];  Service: Obstetrics;  Laterality: Bilateral;  . WISDOM TOOTH EXTRACTION      Social History   Tobacco Use  . Smoking status: Never Smoker  . Smokeless  tobacco: Never Used  Substance Use Topics  . Alcohol use: No    Comment: rarely, but not while preg  . Drug use: No    Family History  Problem Relation Age of Onset  . High blood pressure Mother   . Arthritis Mother   . Hypertension Mother   . Other Mother        "Multisystem atrophy"  . Clotting disorder Mother   . Irritable bowel syndrome Mother   . Alcohol abuse Father   . Arthritis Father   . Colon polyps Father   . High blood pressure Brother   . Prostate cancer Maternal Grandfather   . Colon cancer Maternal Uncle   . Stomach cancer Neg Hx     Allergies  Allergen Reactions  . Topamax [Topiramate] Other (See Comments)    Pt states that she has trouble recalling words when she takes this medication.    Medication list has been reviewed and updated.  Current Outpatient Medications on File Prior to Visit  Medication Sig Dispense Refill  . Apremilast (OTEZLA) 30 MG TABS Take 30 mg by mouth 2 (two) times daily.     . budesonide-formoterol (SYMBICORT) 80-4.5 MCG/ACT inhaler Inhale 2 puffs into the lungs 2 (two) times daily. 1 Inhaler 3  . cyanocobalamin (,VITAMIN B-12,) 1000 MCG/ML injection Inject 1 mL (1,000 mcg total) into the muscle once a week. 10 mL 2  . ergocalciferol (VITAMIN D2) 50000 units capsule Take 1 capsule (50,000 Units total) by mouth once a week. 8 capsule 6  . HYDROcodone-Homatropine 5-1.5 MG TABS Take 1 teaspoon (5 mL's) every 4-6 hours as needed for cough. 56 each 0  . ibuprofen (ADVIL,MOTRIN) 600 MG tablet Take 1 tablet (600 mg total) by mouth every 6 (six) hours as needed. 30 tablet 1  . Multiple Vitamin (MULTIVITAMIN) tablet Take 1 tablet by mouth daily.    Marland Kitchen venlafaxine XR (EFFEXOR-XR) 150 MG 24 hr capsule Take 1 capsule (150 mg total) by mouth daily with breakfast. 90 capsule 3  . loratadine (CLARITIN) 10 MG tablet Take 1 tablet (10 mg total) by mouth daily. (Patient not taking: Reported on 03/07/2018) 30 tablet 11   No current facility-administered  medications on file prior to visit.     Review of Systems:  As per HPI- otherwise negative.  No exertional chest pain, or abnormal shortness of breath.   No vomiting or diarrhea She has noted a likely incisional hernia just above the umbilicus.  This is not painful, but she just noticed it.  She had incision here for appendectomy Physical Examination: Vitals:   03/07/18 1028  BP: 118/80  Pulse: 88  Resp: 16  Temp: 98.2 F (36.8 C)  SpO2:  98%   Vitals:   03/07/18 1028  Weight: 229 lb (103.9 kg)  Height: 5\' 8"  (1.727 m)   Body mass index is 34.82 kg/m. Ideal Body Weight: Weight in (lb) to have BMI = 25: 164.1  GEN: WDWN, NAD, Non-toxic, A & O x 3, obese, otherwise looks well HEENT: Atraumatic, Normocephalic. Neck supple. No masses, No LAD. Bilateral TM wnl, oropharynx normal.  PEERL,EOMI.   Ears and Nose: No external deformity. CV: RRR, No M/G/R. No JVD. No thrill. No extra heart sounds. PULM: CTA B, no wheezes, crackles, rhonchi. No retractions. No resp. distress. No accessory muscle use. ABD: S, NT, ND, +BS. No rebound. No HSM.  Easily reducible incisional hernia superior to the umbilicus.  Nontender EXTR: No c/c/e NEURO Normal gait.  PSYCH: Normally interactive. Conversant. Not depressed or anxious appearing.  Calm demeanor.   Dg Chest 2 View  Result Date: 03/07/2018 CLINICAL DATA:  Cough with right-sided chest and back pain for 2 months EXAM: CHEST - 2 VIEW COMPARISON:  This 11/17/2016 FINDINGS: Minimal scarring or atelectasis on the left. There is no edema, consolidation, effusion, or pneumothorax. Normal heart size and mediastinal contours. IMPRESSION: 1. Minimal scarring or atelectasis on the left. 2. No acute finding. Electronically Signed   By: Monte Fantasia M.D.   On: 03/07/2018 11:10    Assessment and Plan: Persistent cough - Plan: DG Chest 2 View, montelukast (SINGULAIR) 10 MG tablet, ciclesonide (ALVESCO) 80 MCG/ACT inhaler  Anemia, unspecified type - Plan:  CBC, Ferritin  Here today with persistent cough, which did not respond to antibiotics or prednisone.  We wonder if she may have either allergies or type of cough variant asthma.  I have given her prescription for Singulair, and also inhaled steroid. Obtain chest x-ray as above, will send report to patient. We will repeat a CBC and ferritin for her today to follow-up on her anemia She is asked to keep me posted as to her progress  Signed Lamar Blinks, MD  Original inhaled steroid not available, I changed to qvar  Received her labs Results for orders placed or performed in visit on 03/07/18  CBC  Result Value Ref Range   WBC 12.4 (H) 4.0 - 10.5 K/uL   RBC 4.80 3.87 - 5.11 Mil/uL   Platelets 376.0 150.0 - 400.0 K/uL   Hemoglobin 14.1 12.0 - 15.0 g/dL   HCT 42.7 36.0 - 46.0 %   MCV 89.1 78.0 - 100.0 fl   MCHC 33.0 30.0 - 36.0 g/dL   RDW 12.9 11.5 - 15.5 %  Ferritin  Result Value Ref Range   Ferritin 103.7 10.0 - 291.0 ng/mL

## 2018-03-07 ENCOUNTER — Encounter: Payer: Self-pay | Admitting: Family Medicine

## 2018-03-07 ENCOUNTER — Ambulatory Visit: Payer: 59 | Admitting: Family Medicine

## 2018-03-07 ENCOUNTER — Ambulatory Visit (HOSPITAL_BASED_OUTPATIENT_CLINIC_OR_DEPARTMENT_OTHER)
Admission: RE | Admit: 2018-03-07 | Discharge: 2018-03-07 | Disposition: A | Discharge status: Home / Self Care | Payer: 59 | Source: Ambulatory Visit | Attending: Family Medicine | Admitting: Family Medicine

## 2018-03-07 VITALS — BP 118/80 | HR 88 | Temp 98.2°F | Resp 16 | Ht 68.0 in | Wt 229.0 lb

## 2018-03-07 DIAGNOSIS — R053 Chronic cough: Secondary | ICD-10-CM

## 2018-03-07 DIAGNOSIS — R05 Cough: Secondary | ICD-10-CM | POA: Diagnosis not present

## 2018-03-07 DIAGNOSIS — D649 Anemia, unspecified: Secondary | ICD-10-CM

## 2018-03-07 LAB — CBC
HCT: 42.7 % (ref 36.0–46.0)
Hemoglobin: 14.1 g/dL (ref 12.0–15.0)
MCHC: 33 g/dL (ref 30.0–36.0)
MCV: 89.1 fl (ref 78.0–100.0)
Platelets: 376 10*3/uL (ref 150.0–400.0)
RBC: 4.8 Mil/uL (ref 3.87–5.11)
RDW: 12.9 % (ref 11.5–15.5)
WBC: 12.4 10*3/uL — ABNORMAL HIGH (ref 4.0–10.5)

## 2018-03-07 LAB — FERRITIN: Ferritin: 103.7 ng/mL (ref 10.0–291.0)

## 2018-03-07 MED ORDER — BECLOMETHASONE DIPROP HFA 80 MCG/ACT IN AERB: 1.0000 | INHALATION_SPRAY | Freq: Two times a day (BID) | RESPIRATORY_TRACT | 3 refills | Status: DC

## 2018-03-07 MED ORDER — MONTELUKAST SODIUM 10 MG PO TABS: 10.0000 mg | ORAL_TABLET | Freq: Every day | ORAL | 6 refills | Status: DC

## 2018-03-07 MED ORDER — CICLESONIDE 80 MCG/ACT IN AERS: 1.0000 | INHALATION_SPRAY | Freq: Two times a day (BID) | RESPIRATORY_TRACT | 11 refills | Status: DC

## 2018-03-07 MED FILL — MONTELUKAST SOD 10 MG TAB: 10 | 30 days supply | Qty: 30 | Fill #0

## 2018-03-07 MED FILL — QVAR REDIHALER 80 MCG/ACT A: 80 | 60 days supply | Qty: 11 | Fill #0

## 2018-03-07 NOTE — Patient Instructions (Signed)
It was good to see you today, but I am sorry having a hard time with this cough. Please go to the lab so we can check your iron and blood counts. Then if you would have an x-ray done on the ground floor in our imaging department. I will be in touch of these reports ASAP. Let us try a steroid inhaler for you, and also Singulair tablet.  Please let me know how these work for you

## 2018-03-07 NOTE — Addendum Note (Signed)
Addended by: Lamar Blinks C on: 03/07/2018 05:43 PM   Modules accepted: Orders

## 2018-03-08 ENCOUNTER — Ambulatory Visit: Payer: 59 | Admitting: Psychology

## 2018-03-21 ENCOUNTER — Ambulatory Visit: Payer: 59 | Admitting: Psychology

## 2018-03-21 DIAGNOSIS — F4323 Adjustment disorder with mixed anxiety and depressed mood: Secondary | ICD-10-CM | POA: Diagnosis not present

## 2018-03-26 NOTE — Progress Notes (Addendum)
[place] at [facility] [address] [place], [place] [address] [phone]  Date:  03/27/2018   Name:  [name]   DOB:  February 19, 1976   MRN:  [id number]  PCP:  Darreld Mclean, MD    Chief Complaint: Hand Pain (right hand/wrist, off and on since monday, no known injury, taking ibuprofen, arthritis? )   History of Present Illness:  [name] is a 43 y.o. very pleasant female patient who presents with the following:  History of B12 deficiency, depression, IBS, and migraine which is treated by neurology.  I saw her about 3 weeks ago with a cough (she had already been treated with antibiotics and prednisone at another clinic).  At that time I gave her Singulair and inhaled steroid.  Here today with concern regarding her right hand and wrist The left is ok, no issue here  She has noted some swelling and pain of the knuckle joints for the last week.  She has had this in the past, but never this persistent Generally ibuprofen would clear it up -however this time it continues to bother her She notes swelling over the second third and fourth MCP joints, and some pain in using her hands such as when lifting up a pan while cooking.  However is otherwise not acutely painful.  There is no known injury  She does have psoriasis- she is treated by a derm at [facility] in [place].   She is on Otezela for this, she has been on this for 6 months perhaps  She wonders if she could have psoriatic arthritis  Flu shot: Declined today  No fevers, otherwise feels well.  No other joint concerns today Patient Active Problem List   Diagnosis Date Noted  . IDA (iron deficiency anemia) 06/26/2017  . Iron deficiency anemia due to chronic blood loss 06/26/2017  . B12 deficiency 03/30/2017  . Asthma 12/08/2016  . Scalp lesion 01/01/2014  . Allergic rhinitis 11/11/2013  . Daytime somnolence 09/04/2013  . Syringomyelia ([place]) 05/09/2013   . Mass 05/09/2013  . Paresthesia 07/19/2012  . Depression 07/19/2012  . Sensory disturbance 06/27/2012  . Leg weakness 06/27/2012  . Leukocytosis 06/27/2012  . Microcytic anemia 06/27/2012    Past Medical History:  Diagnosis Date  . Anemia   . Anxiety   . Chicken pox   . Depression   . GERD (gastroesophageal reflux disease)    tums prn-with pregnancy only  . Headache(784.0)   . IBS (irritable bowel syndrome)   . Migraines   . Placenta previa 2012   with current pregnancy-type and cross 2 Units per protocol  . PONV (postoperative nausea and vomiting)   . Shortness of breath dyspnea    with this pregnancy   . Syringomyelia, acquired [facility])     Past Surgical History:  Procedure Laterality Date  . APPENDECTOMY  2005  . arthroscopic knee  1990  . CESAREAN SECTION  12/27/2010   Procedure: CESAREAN SECTION;  Surgeon: Shon Millet II;  Location: [facility];  Service: Gynecology;  Laterality: N/A;  . CESAREAN SECTION N/A 02/03/2015   Procedure: CESAREAN SECTION;  Surgeon: Everlene Farrier, MD;  Location: [facility];  Service: Obstetrics;  Laterality: N/A;  . left breast reduction  1996  . TUBAL LIGATION Bilateral 02/03/2015   Procedure: BILATERAL TUBAL LIGATION;  Surgeon: Everlene Farrier, MD;  Location: [facility];  Service: Obstetrics;  Laterality: Bilateral;  . WISDOM TOOTH EXTRACTION  Social History   Tobacco Use  . Smoking status: Never Smoker  . Smokeless tobacco: Never Used  Substance Use Topics  . Alcohol use: No    Comment: rarely, but not while preg  . Drug use: No    Family History  Problem Relation Age of Onset  . High blood pressure Mother   . Arthritis Mother   . Hypertension Mother   . Other Mother        "Multisystem atrophy"  . Clotting disorder Mother   . Irritable bowel syndrome Mother   . Alcohol abuse Father   . Arthritis Father   . Colon polyps Father   . High blood pressure Brother   . Prostate cancer Maternal Grandfather   . Colon cancer Maternal  Uncle   . Stomach cancer Neg Hx     Allergies  Allergen Reactions  . Topamax [Topiramate] Other (See Comments)    Pt states that she has trouble recalling words when she takes this medication.    Medication list has been reviewed and updated.  Current Outpatient Medications on File Prior to Visit  Medication Sig Dispense Refill  . Apremilast (OTEZLA) 30 MG TABS Take 30 mg by mouth 2 (two) times daily.     . beclomethasone (QVAR REDIHALER) 80 MCG/ACT inhaler Inhale 1 puff into the lungs 2 (two) times daily. 1 Inhaler 3  . budesonide-formoterol (SYMBICORT) 80-4.5 MCG/ACT inhaler Inhale 2 puffs into the lungs 2 (two) times daily. 1 Inhaler 3  . ciclesonide (ALVESCO) 80 MCG/ACT inhaler Inhale 1 puff into the lungs 2 (two) times daily. 1 Inhaler 11  . cyanocobalamin (,VITAMIN B-12,) 1000 MCG/ML injection Inject 1 mL (1,000 mcg total) into the muscle once a week. 10 mL 2  . ergocalciferol (VITAMIN D2) 50000 units capsule Take 1 capsule (50,000 Units total) by mouth once a week. 8 capsule 6  . HYDROcodone-Homatropine 5-1.5 MG TABS Take 1 teaspoon (5 mL's) every 4-6 hours as needed for cough. 56 each 0  . ibuprofen (ADVIL,MOTRIN) 600 MG tablet Take 1 tablet (600 mg total) by mouth every 6 (six) hours as needed. 30 tablet 1  . loratadine (CLARITIN) 10 MG tablet Take 1 tablet (10 mg total) by mouth daily. 30 tablet 11  . montelukast (SINGULAIR) 10 MG tablet Take 1 tablet (10 mg total) by mouth at bedtime. 30 tablet 6  . Multiple Vitamin (MULTIVITAMIN) tablet Take 1 tablet by mouth daily.    Marland Kitchen venlafaxine XR (EFFEXOR-XR) 150 MG 24 hr capsule Take 1 capsule (150 mg total) by mouth daily with breakfast. 90 capsule 3   No current facility-administered medications on file prior to visit.     Review of Systems:  As per HPI- otherwise negative. No fever or chills, no rash  Physical Examination: Vitals:   03/27/18 1113  BP: 120/82  Pulse: 91  Resp: 16  Temp: 98.1 F (36.7 C)  SpO2: 98%    Vitals:   03/27/18 1113  Weight: 226 lb (102.5 kg)  Height: 5' 8"  (1.727 m)   Body mass index is 34.36 kg/m. Ideal Body Weight: Weight in (lb) to have BMI = 25: 164.1  GEN: WDWN, NAD, Non-toxic, A & O x 3, obese, looks well HEENT: Atraumatic, Normocephalic. Neck supple. No masses, No LAD. Ears and Nose: No external deformity. CV: RRR, No M/G/R. No JVD. No thrill. No extra heart sounds. PULM: CTA B, no wheezes, crackles, rhonchi. No retractions. No resp. distress. No accessory muscle use. EXTR: No c/c/e NEURO Normal gait.  PSYCH: Normally interactive. Conversant. Not depressed or anxious appearing.  Calm demeanor. Right hand: She does have visible swelling of the dorsal second third and fourth MCP joints, and the fingers are minimally swollen on her right hand She notes minimal tenderness over the dorsal right wrist, but her Finkelstein's is negative Grip strength, other strength testing of her right hand and wrist is normal   Assessment and Plan: Finger joint swelling, right - Plan: DG Hand Complete Right, Basic metabolic panel, CBC with Differential/Platelet, Sedimentation rate, C-reactive protein, Cyclic citrul peptide antibody, IgG (QUEST), ANA, HLA-B27 Antigen, Uric acid, Rheumatoid Factor  Here today with concern of swelling of her right hand joints.  She does have history of psoriasis, and wonders if she could have psoriatic arthritis.  We will obtain x-rays of her hand today, and do lab testing as above.  I will be in touch with these results ASAP  Signed Lamar Blinks, MD  Dg Chest 2 View  Result Date: 03/07/2018 CLINICAL DATA:  Cough with right-sided chest and back pain for 2 months EXAM: CHEST - 2 VIEW COMPARISON:  This 11/17/2016 FINDINGS: Minimal scarring or atelectasis on the left. There is no edema, consolidation, effusion, or pneumothorax. Normal heart size and mediastinal contours. IMPRESSION: 1. Minimal scarring or atelectasis on the left. 2. No acute finding.  Electronically Signed   By: Monte Fantasia M.D.   On: 03/07/2018 11:10   Dg Hand Complete Right  Result Date: 03/27/2018 CLINICAL DATA:  Right hand pain for 1 week, no known injury, initial encounter EXAM: RIGHT HAND - COMPLETE 3+ VIEW COMPARISON:  None. FINDINGS: There is no evidence of fracture or dislocation. There is no evidence of arthropathy or other focal bone abnormality. Soft tissues are unremarkable. IMPRESSION: No acute abnormality noted. Electronically Signed   By: Inez Catalina M.D.   On: 03/27/2018 17:06     Addendum 2/9, received her labs as below Message to patient Results for orders placed or performed in visit on 86/76/72  Basic metabolic panel  Result Value Ref Range   Sodium 138 135 - 145 mEq/L   Potassium 4.4 3.5 - 5.1 mEq/L   Chloride 104 96 - 112 mEq/L   CO2 26 19 - 32 mEq/L   Glucose, Bld 112 (H) 70 - 99 mg/dL   BUN 8 6 - 23 mg/dL   Creatinine, Ser 0.66 0.40 - 1.20 mg/dL   Calcium 9.5 8.4 - 10.5 mg/dL   GFR 98.63 >60.00 mL/min  CBC with Differential/Platelet  Result Value Ref Range   WBC 11.9 (H) 4.0 - 10.5 K/uL   RBC 4.86 3.87 - 5.11 Mil/uL   Hemoglobin 14.2 12.0 - 15.0 g/dL   HCT 42.7 36.0 - 46.0 %   MCV 87.9 78.0 - 100.0 fl   MCHC 33.3 30.0 - 36.0 g/dL   RDW 12.6 11.5 - 15.5 %   Platelets 425.0 (H) 150.0 - 400.0 K/uL   Neutrophils Relative % 75.0 43.0 - 77.0 %   Lymphocytes Relative 18.1 12.0 - 46.0 %   Monocytes Relative 5.2 3.0 - 12.0 %   Eosinophils Relative 1.1 0.0 - 5.0 %   Basophils Relative 0.6 0.0 - 3.0 %   Neutro Abs 8.9 (H) 1.4 - 7.7 K/uL   Lymphs Abs 2.2 0.7 - 4.0 K/uL   Monocytes Absolute 0.6 0.1 - 1.0 K/uL   Eosinophils Absolute 0.1 0.0 - 0.7 K/uL   Basophils Absolute 0.1 0.0 - 0.1 K/uL  Sedimentation rate  Result Value Ref Range  Sed Rate 31 (H) 0 - 20 mm/hr  C-reactive protein  Result Value Ref Range   CRP 1.2 0.5 - 29.9 mg/dL  Cyclic citrul peptide antibody, IgG (QUEST)  Result Value Ref Range   Cyclic Citrullin Peptide Ab  <16 UNITS  ANA  Result Value Ref Range   Anti Nuclear Antibody(ANA) POSITIVE (A) NEGATIVE  HLA-B27 Antigen  Result Value Ref Range   HLA-B27 Antigen NEGATIVE NEGATIVE  Uric acid  Result Value Ref Range   Uric Acid, Serum 5.7 2.4 - 7.0 mg/dL  Rheumatoid Factor  Result Value Ref Range   Rhuematoid fact SerPl-aCnc <14 <14 IU/mL  Anti-nuclear ab-titer (ANA titer)  Result Value Ref Range   ANA Titer 1 1:40 (H) titer   ANA Pattern 1 Nuclear, Homogeneous (A)    She does continue to have slightly elevated white blood cell count, but this is improved from January ANA is also slightly positive, but the titer is only 1-40 Sed rate is slightly high    Metabolic profile is normal Your blood counts are okay, white blood cell count is still minimally elevated.  However this is improved from last month.  Platelet count is very slightly up, this is a nonspecific finding. !!!!!!!!!!!!!However I would like to repeat a CBC in 2 to 3 months.  I will order this for you, it can be done as a lab visit only!!!!!!!!!!!!  Your sedimentation rate, which is a nonspecific inflammatory marker, is slightly high.  However, your CRP (which is used for similar diagnostic purposes) is normal Otherwise your autoimmune labs are normal, except for a slightly positive ANA.  Your ANA titer however, is low.  This makes the ANA less likely to be significant  Your labs do not show anything definite.  However, you do appear to have swelling in your right hand.  Given that you do have psoriasis, it would be reasonable to have you see a rheumatologist for consultation.  Alternatively, we could give you some time and see how things look, and repeat labs in a few months  He is let me know you prefer to proceed, and take care

## 2018-03-27 ENCOUNTER — Encounter: Payer: Self-pay | Admitting: Family Medicine

## 2018-03-27 ENCOUNTER — Ambulatory Visit: Payer: 59 | Admitting: Family Medicine

## 2018-03-27 ENCOUNTER — Ambulatory Visit (HOSPITAL_BASED_OUTPATIENT_CLINIC_OR_DEPARTMENT_OTHER)
Admission: RE | Admit: 2018-03-27 | Discharge: 2018-03-27 | Disposition: A | Discharge status: Home / Self Care | Payer: 59 | Source: Ambulatory Visit | Attending: Family Medicine | Admitting: Family Medicine

## 2018-03-27 VITALS — BP 120/82 | HR 91 | Temp 98.1°F | Resp 16 | Ht 68.0 in | Wt 226.0 lb

## 2018-03-27 DIAGNOSIS — M25441 Effusion, right hand: Secondary | ICD-10-CM

## 2018-03-27 DIAGNOSIS — D72829 Elevated white blood cell count, unspecified: Secondary | ICD-10-CM | POA: Diagnosis not present

## 2018-03-27 DIAGNOSIS — M79641 Pain in right hand: Secondary | ICD-10-CM | POA: Diagnosis not present

## 2018-03-27 LAB — BASIC METABOLIC PANEL
BUN: 8 mg/dL (ref 6–23)
CHLORIDE: 104 meq/L (ref 96–112)
CO2: 26 mEq/L (ref 19–32)
Calcium: 9.5 mg/dL (ref 8.4–10.5)
Creatinine, Ser: 0.66 mg/dL (ref 0.40–1.20)
GFR: 98.63 mL/min (ref >60.00)
Glucose, Bld: 112 mg/dL — ABNORMAL HIGH (ref 70–99)
Potassium: 4.4 mEq/L (ref 3.5–5.1)
Sodium: 138 mEq/L (ref 135–145)

## 2018-03-27 LAB — CBC WITH DIFFERENTIAL/PLATELET
Basophils Absolute: 0.1 10*3/uL (ref 0.0–0.1)
Basophils Relative: 0.6 % (ref 0.0–3.0)
Eosinophils Absolute: 0.1 10*3/uL (ref 0.0–0.7)
Eosinophils Relative: 1.1 % (ref 0.0–5.0)
HCT: 42.7 % (ref 36.0–46.0)
HEMOGLOBIN: 14.2 g/dL (ref 12.0–15.0)
Lymphocytes Relative: 18.1 % (ref 12.0–46.0)
Lymphs Abs: 2.2 10*3/uL (ref 0.7–4.0)
MCHC: 33.3 g/dL (ref 30.0–36.0)
MCV: 87.9 fl (ref 78.0–100.0)
Monocytes Absolute: 0.6 10*3/uL (ref 0.1–1.0)
Monocytes Relative: 5.2 % (ref 3.0–12.0)
Neutro Abs: 8.9 10*3/uL — ABNORMAL HIGH (ref 1.4–7.7)
Neutrophils Relative %: 75 % (ref 43.0–77.0)
Platelets: 425 10*3/uL — ABNORMAL HIGH (ref 150.0–400.0)
RBC: 4.86 Mil/uL (ref 3.87–5.11)
RDW: 12.6 % (ref 11.5–15.5)
WBC: 11.9 10*3/uL — AB (ref 4.0–10.5)

## 2018-03-27 LAB — URIC ACID: Uric Acid, Serum: 5.7 mg/dL (ref 2.4–7.0)

## 2018-03-27 LAB — C-REACTIVE PROTEIN: CRP: 1.2 mg/dL (ref 0.5–20.0)

## 2018-03-27 LAB — SEDIMENTATION RATE: Sed Rate: 31 mm/hr — ABNORMAL HIGH (ref 0–20)

## 2018-03-27 NOTE — Patient Instructions (Signed)
Please go to the lab, and then to the ground floor to have x-rays of your right hand.  I will be in touch with the results ASAP.  We will look for any evidence that you may have psoriatic arthritis.  If we are concerned about this, I would suggest we have you see a rheumatologist  For the meantime continue taking your psoriasis pill, let me know if any change or worsening

## 2018-03-29 LAB — CYCLIC CITRUL PEPTIDE ANTIBODY, IGG: Cyclic Citrullin Peptide Ab: <16 UNITS

## 2018-03-29 LAB — ANTI-NUCLEAR AB-TITER (ANA TITER): ANA Titer 1: 1:40 {titer} — ABNORMAL HIGH

## 2018-03-29 LAB — RHEUMATOID FACTOR: Rheumatoid fact SerPl-aCnc: <14 IU/mL (ref <14)

## 2018-03-29 LAB — HLA-B27 ANTIGEN: HLA-B27 Antigen: NEGATIVE

## 2018-03-29 LAB — ANA: Anti Nuclear Antibody(ANA): POSITIVE — AB

## 2018-03-31 ENCOUNTER — Encounter: Payer: Self-pay | Admitting: Family Medicine

## 2018-03-31 NOTE — Addendum Note (Signed)
Addended by: Darreld Mclean on: 03/31/2018 06:58 AM   Modules accepted: Orders

## 2018-04-10 ENCOUNTER — Encounter: Payer: Self-pay | Admitting: Family Medicine

## 2018-04-10 DIAGNOSIS — R768 Other specified abnormal immunological findings in serum: Secondary | ICD-10-CM

## 2018-04-10 DIAGNOSIS — M25441 Effusion, right hand: Secondary | ICD-10-CM

## 2018-04-16 ENCOUNTER — Ambulatory Visit: Payer: Self-pay | Admitting: Psychology

## 2018-04-25 DIAGNOSIS — R768 Other specified abnormal immunological findings in serum: Secondary | ICD-10-CM | POA: Diagnosis not present

## 2018-04-25 DIAGNOSIS — M2550 Pain in unspecified joint: Secondary | ICD-10-CM | POA: Diagnosis not present

## 2018-04-25 DIAGNOSIS — L401 Generalized pustular psoriasis: Secondary | ICD-10-CM | POA: Diagnosis not present

## 2018-04-25 DIAGNOSIS — Z6835 Body mass index (BMI) 35.0-35.9, adult: Secondary | ICD-10-CM | POA: Diagnosis not present

## 2018-04-25 DIAGNOSIS — E669 Obesity, unspecified: Secondary | ICD-10-CM | POA: Diagnosis not present

## 2018-04-25 DIAGNOSIS — R5382 Chronic fatigue, unspecified: Secondary | ICD-10-CM | POA: Diagnosis not present

## 2018-05-22 ENCOUNTER — Encounter: Payer: Self-pay | Admitting: Family Medicine

## 2018-05-23 ENCOUNTER — Encounter: Payer: Self-pay | Admitting: Family Medicine

## 2018-05-23 ENCOUNTER — Other Ambulatory Visit: Payer: Self-pay

## 2018-05-23 ENCOUNTER — Ambulatory Visit (HOSPITAL_BASED_OUTPATIENT_CLINIC_OR_DEPARTMENT_OTHER)
Admission: RE | Admit: 2018-05-23 | Discharge: 2018-05-23 | Disposition: A | Discharge status: Home / Self Care | Payer: 59 | Source: Ambulatory Visit | Attending: Family Medicine | Admitting: Family Medicine

## 2018-05-23 ENCOUNTER — Ambulatory Visit (INDEPENDENT_AMBULATORY_CARE_PROVIDER_SITE_OTHER): Payer: 59 | Admitting: Family Medicine

## 2018-05-23 DIAGNOSIS — M79671 Pain in right foot: Secondary | ICD-10-CM | POA: Insufficient documentation

## 2018-05-23 DIAGNOSIS — F430 Acute stress reaction: Secondary | ICD-10-CM | POA: Diagnosis not present

## 2018-05-23 MED ORDER — MELOXICAM 7.5 MG PO TABS: 7.5000 mg | ORAL_TABLET | Freq: Every day | ORAL | 0 refills | Status: DC

## 2018-05-23 MED ORDER — ALPRAZOLAM 0.25 MG PO TABS: 0.2500 mg | ORAL_TABLET | Freq: Two times a day (BID) | ORAL | 0 refills | Status: DC | PRN

## 2018-05-23 NOTE — Patient Instructions (Signed)
It was very nice to talk with you today, I am sorry things are so difficult right now.  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 I prescribed Xanax 0.25 to take twice a day as needed.  Remember this medication can make you drowsy, do not use if you need to drive.  This medication can be habit-forming, so use as little as you can  Please come in for an x-ray of your foot when you are able.  In the meantime, try to wear supportive shoes and avoid going barefoot.  You can use the meloxicam as needed for pain.  If you use meloxicam, do not also take ibuprofen or Aleve.  If your foot is getting much worse or you have any other concerns, please let me know.  We can bring into the office face-to-face if we have to  If you are not okay, or feel you are at risk for hurting yourself or others please to contact me or seek care by dialing 911.

## 2018-05-23 NOTE — Progress Notes (Signed)
[place] at [facility] [address] [place], [place] [address] [phone]  Date:  05/23/2018   Name:  [name]   DOB:  12-27-1975   MRN:  [id number]  PCP:  Darreld Mclean, MD    Chief Complaint: No chief complaint on file.   History of Present Illness:  [name] is a 43 y.o. very pleasant female patient who presents with the following:  webex visit today due to COVID-19 pandemic Pt ID confirmed with name and DOB Patient with history of B12 deficiency, depression, IBS, migraines treated by neurology, psoriasis treated by [facility] dermatology in [facility] Seen here most recently in February with swelling of her fingers of the right hand. We wondered if she could have psoriatic arthritis, and I did a lab work-up for her She had a slightly high sed rate, normal CRP.  Slightly positive ANA with a low titer.  Also slightly elevated white blood cell and platelet counts We did go ahead and refer her to rheumatology in February She saw [facility] rheumatology on March 5, they do think that she likely has psoriatic arthritis.  They were considering trying Humira or a similar medication- however they are holding off on this for now  She is still taking otezla  She has noted trouble walking on her right foot for the last 7- 10 days.  Pain seems to be located over the 4th/5th MT area It hurts to point the toes or rotate the foot.  It gets worse as the day goes on She thinks that she might have felt a pop in her foot, not quite sure No fall or other acute injury noted No redness noted Mild swelling and bruising over the dorsum of the foot Walking with shoes is more comfortable She has used ibuprofen and ice- helps some   She is generally feeling well, no cough or fever  She is on effexor xr 150 right now.  However she also notes that she is under acute increased stress. Due to COVID-19 outbreak, her 3 children  are at home with her.  She had to take a leave absence from her job in order to care for them.  Her oldest has special needs, and is really struggling with having to stay home all the time [name] has used Xanax in the past, wonders if I might be able to refill this for her to use during this time of acute stress.  I do think this would be reasonable given current circumstances She does not express any suicidal or homicidal ideation.  She is just feeling very stressed out  BP Readings from Last 3 Encounters:  03/27/18 120/82  03/07/18 118/80  02/19/18 135/86    Patient Active Problem List   Diagnosis Date Noted  . IDA (iron deficiency anemia) 06/26/2017  . Iron deficiency anemia due to chronic blood loss 06/26/2017  . B12 deficiency 03/30/2017  . Asthma 12/08/2016  . Scalp lesion 01/01/2014  . Allergic rhinitis 11/11/2013  . Daytime somnolence 09/04/2013  . Syringomyelia ([place]) 05/09/2013  . Mass 05/09/2013  . Paresthesia 07/19/2012  . Depression 07/19/2012  . Sensory disturbance 06/27/2012  . Leg weakness 06/27/2012  . Leukocytosis 06/27/2012  . Microcytic anemia 06/27/2012    Past Medical History:  Diagnosis Date  . Anemia   . Anxiety   . Chicken pox   . Depression   . GERD (gastroesophageal reflux disease)    tums  prn-with pregnancy only  . Headache(784.0)   . IBS (irritable bowel syndrome)   . Migraines   . Placenta previa 2012   with current pregnancy-type and cross 2 Units per protocol  . PONV (postoperative nausea and vomiting)   . Shortness of breath dyspnea    with this pregnancy   . Syringomyelia, acquired [facility])     Past Surgical History:  Procedure Laterality Date  . APPENDECTOMY  2005  . arthroscopic knee  1990  . CESAREAN SECTION  12/27/2010   Procedure: CESAREAN SECTION;  Surgeon: Shon Millet II;  Location: [facility];  Service: Gynecology;  Laterality: N/A;  . CESAREAN SECTION N/A 02/03/2015   Procedure: CESAREAN SECTION;  Surgeon: Everlene Farrier, MD;   Location: [facility];  Service: Obstetrics;  Laterality: N/A;  . left breast reduction  1996  . TUBAL LIGATION Bilateral 02/03/2015   Procedure: BILATERAL TUBAL LIGATION;  Surgeon: Everlene Farrier, MD;  Location: [facility];  Service: Obstetrics;  Laterality: Bilateral;  . WISDOM TOOTH EXTRACTION      Social History   Tobacco Use  . Smoking status: Never Smoker  . Smokeless tobacco: Never Used  Substance Use Topics  . Alcohol use: No    Comment: rarely, but not while preg  . Drug use: No    Family History  Problem Relation Age of Onset  . High blood pressure Mother   . Arthritis Mother   . Hypertension Mother   . Other Mother        "Multisystem atrophy"  . Clotting disorder Mother   . Irritable bowel syndrome Mother   . Alcohol abuse Father   . Arthritis Father   . Colon polyps Father   . High blood pressure Brother   . Prostate cancer Maternal Grandfather   . Colon cancer Maternal Uncle   . Stomach cancer Neg Hx     Allergies  Allergen Reactions  . Topamax [Topiramate] Other (See Comments)    Pt states that she has trouble recalling words when she takes this medication.    Medication list has been reviewed and updated.  Current Outpatient Medications on File Prior to Visit  Medication Sig Dispense Refill  . Apremilast (OTEZLA) 30 MG TABS Take 30 mg by mouth 2 (two) times daily.     . beclomethasone (QVAR REDIHALER) 80 MCG/ACT inhaler Inhale 1 puff into the lungs 2 (two) times daily. 1 Inhaler 3  . budesonide-formoterol (SYMBICORT) 80-4.5 MCG/ACT inhaler Inhale 2 puffs into the lungs 2 (two) times daily. 1 Inhaler 3  . ciclesonide (ALVESCO) 80 MCG/ACT inhaler Inhale 1 puff into the lungs 2 (two) times daily. 1 Inhaler 11  . cyanocobalamin (,VITAMIN B-12,) 1000 MCG/ML injection Inject 1 mL (1,000 mcg total) into the muscle once a week. 10 mL 2  . ergocalciferol (VITAMIN D2) 50000 units capsule Take 1 capsule (50,000 Units total) by mouth once a week. 8 capsule 6  .  HYDROcodone-Homatropine 5-1.5 MG TABS Take 1 teaspoon (5 mL's) every 4-6 hours as needed for cough. 56 each 0  . ibuprofen (ADVIL,MOTRIN) 600 MG tablet Take 1 tablet (600 mg total) by mouth every 6 (six) hours as needed. 30 tablet 1  . loratadine (CLARITIN) 10 MG tablet Take 1 tablet (10 mg total) by mouth daily. 30 tablet 11  . montelukast (SINGULAIR) 10 MG tablet Take 1 tablet (10 mg total) by mouth at bedtime. 30 tablet 6  . Multiple Vitamin (MULTIVITAMIN) tablet Take 1 tablet by mouth daily.    Marland Kitchen  venlafaxine XR (EFFEXOR-XR) 150 MG 24 hr capsule Take 1 capsule (150 mg total) by mouth daily with breakfast. 90 capsule 3   No current facility-administered medications on file prior to visit.     Review of Systems:  As per HPI- otherwise negative. No fever noted  Physical Examination: There were no vitals filed for this visit. There were no vitals filed for this visit. There is no height or weight on file to calculate BMI. Ideal Body Weight:   No vitals due to virtual visit Patient is observed over WebCam.  She appears well, overweight.  No tachypnea or any sign of distress, no wheezing or cough noted.  She shows me her foot on her web camera.  I cannot appreciate any definite abnormality, however the web camera is certainly not a perfect method for examination.  I did offer to have her come into the office, but she declines right now as she needs to take care of her children  Assessment and Plan: Right foot pain - Plan: meloxicam (MOBIC) 7.5 MG tablet, DG Foot Complete Right  Acute stress disorder - Plan: ALPRAZolam (XANAX) 0.25 MG tablet  Virtual visit today due to foot pain and acute stress.  Differential diagnosis for foot includes a Morton's neuroma, less likely a stress fracture, or another soft tissue injury.  She has not increased her activity level, so stress fracture seems less likely.  I did order an x-ray which she will have done as soon as she is able.  In the meantime we will  have her use meloxicam as needed for pain.  She will wear supportive footwear and try to take it easy on her foot  Rx for alprazolam sent to her drugstore, to use as needed for acute distress Summary and recommendations sent to patient via my chart  Meds ordered this encounter  Medications  . ALPRAZolam (XANAX) 0.25 MG tablet    Sig: Take 1 tablet (0.25 mg total) by mouth 2 (two) times daily as needed for anxiety.    Dispense:  40 tablet    Refill:  0  . meloxicam (MOBIC) 7.5 MG tablet    Sig: Take 1 tablet (7.5 mg total) by mouth daily. Use as needed for foot pain    Dispense:  30 tablet    Refill:  0   ------------------------------------------- It was very nice to talk with you today, I am sorry things are so difficult right now.  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 I prescribed Xanax 0.25 to take twice a day as needed.  Remember this medication can make you drowsy, do not use if you need to drive.  This medication can be habit-forming, so use as little as you can  Please come in for an x-ray of your foot when you are able.  In the meantime, try to wear supportive shoes and avoid going barefoot.  You can use the meloxicam as needed for pain.  If you use meloxicam, do not also take ibuprofen or Aleve.  If your foot is getting much worse or you have any other concerns, please let me know.  We can bring into the office face-to-face if we have to  If you are not okay, or feel you are at risk for hurting yourself or others please to contact me or seek care by dialing [phone].   Signed Lamar Blinks, MD

## 2018-05-24 ENCOUNTER — Encounter: Payer: Self-pay | Admitting: Family Medicine

## 2018-05-27 MED FILL — CYANOCOBALAMIN 1,000 MCG/ML: 1000 | 28 days supply | Qty: 4 | Fill #5

## 2018-08-07 ENCOUNTER — Telehealth: Payer: 59 | Admitting: Family

## 2018-08-07 DIAGNOSIS — H60501 Unspecified acute noninfective otitis externa, right ear: Secondary | ICD-10-CM | POA: Diagnosis not present

## 2018-08-07 MED ORDER — NEOMYCIN-POLYMYXIN-HC 3.5-10000-1 OT SOLN: 4.0000 [drp] | Freq: Four times a day (QID) | OTIC | 0 refills | Status: DC

## 2018-08-07 MED FILL — NEO/POLYMYXIN/HC EAR SOLN: 3.5-10000-1 | 13 days supply | Qty: 10 | Fill #0

## 2018-08-07 NOTE — Progress Notes (Signed)
E Visit for Swimmer's Ear  We are sorry that you are not feeling well. Here is how we plan to help!  I have prescribed: Neomycin 0.35%, polymyxin B 10,000 units/mL, and hydrocortisone 0,5% otic solution 4 drops in affected ears four times a day for 7 days    In certain cases swimmer's ear may progress to a more serious bacterial infection of the middle or inner ear.  If you have a fever 102 and up and significantly worsening symptoms, this could indicate a more serious infection moving to the middle/inner and needs face to face evaluation in an office by a provider.  Your symptoms should improve over the next 3 days and should resolve in about 7 days.  Approximately 5 minutes was spent documenting and reviewing patient's chart.    HOME CARE:   Wash your hands frequently.  Do not place the tip of the bottle on your ear or touch it with your fingers.  You can take Acetominophen 650 mg every 4-6 hours as needed for pain.  If pain is severe or moderate, you can apply a heating pad (set on low) or hot water bottle (wrapped in a towel) to outer ear for 20 minutes.  This will also increase drainage.  Avoid ear plugs  Do not use Q-tips  After showers, help the water run out by tilting your head to one side.  GET HELP RIGHT AWAY IF:   Fever is over 102.2 degrees.  You develop progressive ear pain or hearing loss.  Ear symptoms persist longer than 3 days after treatment.  MAKE SURE YOU:   Understand these instructions.  Will watch your condition.  Will get help right away if you are not doing well or get worse.  TO PREVENT SWIMMER'S EAR:  Use a bathing cap or custom fitted swim molds to keep your ears dry.  Towel off after swimming to dry your ears.  Tilt your head or pull your earlobes to allow the water to escape your ear canal.  If there is still water in your ears, consider using a hairdryer on the lowest setting.  Thank you for choosing an e-visit. Your e-visit  answers were reviewed by a board certified advanced clinical practitioner to complete your personal care plan. Depending upon the condition, your plan could have included both over the counter or prescription medications. Please review your pharmacy choice. Be sure that the pharmacy you have chosen is open so that you can pick up your prescription now.  If there is a problem you may message your provider in [place] to have the prescription routed to another pharmacy. Your safety is important to [place]. If you have drug allergies check your prescription carefully.  For the next 24 hours, you can use MyChart to ask questions about today's visit, request a non-urgent call back, or ask for a work or school excuse from your e-visit provider. You will get an email in the next two days asking about your experience. I hope that your e-visit has been valuable and will speed your recovery.

## 2018-09-26 DIAGNOSIS — E559 Vitamin D deficiency, unspecified: Secondary | ICD-10-CM | POA: Diagnosis not present

## 2018-09-26 DIAGNOSIS — R5383 Other fatigue: Secondary | ICD-10-CM | POA: Diagnosis not present

## 2018-09-26 DIAGNOSIS — K582 Mixed irritable bowel syndrome: Secondary | ICD-10-CM | POA: Diagnosis not present

## 2018-09-26 DIAGNOSIS — D519 Vitamin B12 deficiency anemia, unspecified: Secondary | ICD-10-CM | POA: Diagnosis not present

## 2018-09-26 DIAGNOSIS — N924 Excessive bleeding in the premenopausal period: Secondary | ICD-10-CM | POA: Diagnosis not present

## 2018-11-03 ENCOUNTER — Other Ambulatory Visit: Payer: Self-pay

## 2018-11-03 ENCOUNTER — Emergency Department (INDEPENDENT_AMBULATORY_CARE_PROVIDER_SITE_OTHER): Payer: 59

## 2018-11-03 ENCOUNTER — Emergency Department
Admission: EM | Admit: 2018-11-03 | Discharge: 2018-11-03 | Disposition: A | Discharge status: Home / Self Care | Payer: 59 | Source: Home / Self Care | Attending: Family Medicine | Admitting: Family Medicine

## 2018-11-03 DIAGNOSIS — S99911A Unspecified injury of right ankle, initial encounter: Secondary | ICD-10-CM | POA: Diagnosis not present

## 2018-11-03 DIAGNOSIS — M25571 Pain in right ankle and joints of right foot: Secondary | ICD-10-CM | POA: Diagnosis not present

## 2018-11-03 DIAGNOSIS — S93401A Sprain of unspecified ligament of right ankle, initial encounter: Secondary | ICD-10-CM | POA: Diagnosis not present

## 2018-11-03 DIAGNOSIS — S80212A Abrasion, left knee, initial encounter: Secondary | ICD-10-CM | POA: Diagnosis not present

## 2018-11-03 DIAGNOSIS — W19XXXA Unspecified fall, initial encounter: Secondary | ICD-10-CM

## 2018-11-03 DIAGNOSIS — S92254A Nondisplaced fracture of navicular [scaphoid] of right foot, initial encounter for closed fracture: Secondary | ICD-10-CM

## 2018-11-03 MED ORDER — HYDROCODONE-ACETAMINOPHEN 5-325 MG PO TABS: 1.0000 | ORAL_TABLET | Freq: Four times a day (QID) | ORAL | 0 refills | Status: DC | PRN

## 2018-11-03 NOTE — Discharge Instructions (Addendum)
Apply ice pack for 30 minutes every 1 to 2 hours today and tomorrow.  Elevate.  Use crutches and avoid weight bearing.  Wear Ace wrap to control swelling. Apply Bacitracin and bandage to abrasion left knee daily until healed.

## 2018-11-03 NOTE — ED Triage Notes (Signed)
Pt c/o RT foot and ankle pain after falling at the [employer] around 1pm today. Says she stepped off a curb and rolled her ankled, falling to the ground and also scraping her knees. Pain 8/10. Went home, iced and took 2 ibuprofen at 230pm.

## 2018-11-03 NOTE — ED Provider Notes (Signed)
[facility]    CSN: [id number] Arrival date & time: 11/03/18  1532      History   Chief Complaint Chief Complaint  Patient presents with  . Foot Pain  . Ankle Pain    HPI [name] is a 43 y.o. female.   [place] the [employer] [place], patient tripped on a curb and fell, injuring her right foot/ankle and abrading her left knee.  She complains of persistent pain in her right foot/ankle.  The history is provided by the patient.  Foot Injury Location:  Ankle and foot Time since incident:  2 hours Injury: yes   Mechanism of injury: fall   Fall:    Impact surface:  Concrete Ankle location:  R ankle Foot location:  R foot Pain details:    Quality:  Aching   Radiates to:  Does not radiate   Severity:  Severe   Onset quality:  Sudden   Duration:  2 hours   Timing:  Constant   Progression:  Unchanged Chronicity:  New Prior injury to area:  No Relieved by:  Nothing Worsened by:  Bearing weight Ineffective treatments:  Ice and elevation Associated symptoms: decreased ROM, stiffness and swelling   Associated symptoms: no back pain, no muscle weakness, no numbness and no tingling     Past Medical History:  Diagnosis Date  . Anemia   . Anxiety   . Chicken pox   . Depression   . GERD (gastroesophageal reflux disease)    tums prn-with pregnancy only  . Headache(784.0)   . IBS (irritable bowel syndrome)   . Migraines   . Placenta previa 2012   with current pregnancy-type and cross 2 Units per protocol  . PONV (postoperative nausea and vomiting)   . Shortness of breath dyspnea    with this pregnancy   . Syringomyelia, acquired [facility])     Patient Active Problem List   Diagnosis Date Noted  . IDA (iron deficiency anemia) 06/26/2017  . Iron deficiency anemia due to chronic blood loss 06/26/2017  . B12 deficiency 03/30/2017  . Asthma 12/08/2016  . Scalp lesion 01/01/2014  . Allergic rhinitis 11/11/2013  . Daytime somnolence 09/04/2013  .  Syringomyelia ([place]) 05/09/2013  . Mass 05/09/2013  . Paresthesia 07/19/2012  . Depression 07/19/2012  . Sensory disturbance 06/27/2012  . Leg weakness 06/27/2012  . Leukocytosis 06/27/2012  . Microcytic anemia 06/27/2012    Past Surgical History:  Procedure Laterality Date  . APPENDECTOMY  2005  . arthroscopic knee  1990  . CESAREAN SECTION  12/27/2010   Procedure: CESAREAN SECTION;  Surgeon: Shon Millet II;  Location: [facility];  Service: Gynecology;  Laterality: N/A;  . CESAREAN SECTION N/A 02/03/2015   Procedure: CESAREAN SECTION;  Surgeon: Everlene Farrier, MD;  Location: [facility];  Service: Obstetrics;  Laterality: N/A;  . left breast reduction  1996  . TUBAL LIGATION Bilateral 02/03/2015   Procedure: BILATERAL TUBAL LIGATION;  Surgeon: Everlene Farrier, MD;  Location: [facility];  Service: Obstetrics;  Laterality: Bilateral;  . WISDOM TOOTH EXTRACTION      OB History    Gravida  3   Para  3   Term  2   Preterm  1   AB      Living  3     SAB      TAB      Ectopic      Multiple  0   Live Births  3  Home Medications    Prior to Admission medications   Medication Sig Start Date End Date Taking? Authorizing Provider  ALPRAZolam (XANAX) 0.25 MG tablet Take 1 tablet (0.25 mg total) by mouth 2 (two) times daily as needed for anxiety. 05/23/18   Copland, Gay Filler, MD  Apremilast (OTEZLA) 30 MG TABS Take 30 mg by mouth 2 (two) times daily.     [provider]  beclomethasone (QVAR REDIHALER) 80 MCG/ACT inhaler Inhale 1 puff into the lungs 2 (two) times daily. 03/07/18   Copland, Gay Filler, MD  budesonide-formoterol (SYMBICORT) 80-4.5 MCG/ACT inhaler Inhale 2 puffs into the lungs 2 (two) times daily. 12/08/16   Debbrah Alar, NP  ciclesonide (ALVESCO) 80 MCG/ACT inhaler Inhale 1 puff into the lungs 2 (two) times daily. 03/07/18   Copland, Gay Filler, MD  cyanocobalamin (,VITAMIN B-12,) 1000 MCG/ML injection Inject 1 mL (1,000 mcg total) into the  muscle once a week. 06/01/17   Copland, Gay Filler, MD  ergocalciferol (VITAMIN D2) 50000 units capsule Take 1 capsule (50,000 Units total) by mouth once a week. 06/26/17   [place], Holli Humbles, NP  HYDROcodone-acetaminophen (NORCO/VICODIN) 5-325 MG tablet Take 1 tablet by mouth every 6 (six) hours as needed for moderate pain or severe pain. 11/03/18   Kandra Nicolas, MD  ibuprofen (ADVIL,MOTRIN) 600 MG tablet Take 1 tablet (600 mg total) by mouth every 6 (six) hours as needed. 02/06/15   Molli Posey, MD  loratadine (CLARITIN) 10 MG tablet Take 1 tablet (10 mg total) by mouth daily. 12/08/16   Debbrah Alar, NP  meloxicam (MOBIC) 7.5 MG tablet Take 1 tablet (7.5 mg total) by mouth daily. Use as needed for foot pain 05/23/18   Copland, Gay Filler, MD  montelukast (SINGULAIR) 10 MG tablet Take 1 tablet (10 mg total) by mouth at bedtime. 03/07/18   Copland, Gay Filler, MD  Multiple Vitamin (MULTIVITAMIN) tablet Take 1 tablet by mouth daily.    [provider]  neomycin-polymyxin-hydrocortisone (CORTISPORIN) OTIC solution Place 4 drops into the right ear 4 (four) times daily. 08/07/18   Sharion Balloon, FNP  venlafaxine XR (EFFEXOR-XR) 150 MG 24 hr capsule Take 1 capsule (150 mg total) by mouth daily with breakfast. 06/04/17   Copland, Gay Filler, MD    Family History Family History  Problem Relation Age of Onset  . High blood pressure Mother   . Arthritis Mother   . Hypertension Mother   . Other Mother        "Multisystem atrophy"  . Clotting disorder Mother   . Irritable bowel syndrome Mother   . Alcohol abuse Father   . Arthritis Father   . Colon polyps Father   . High blood pressure Brother   . Prostate cancer Maternal Grandfather   . Colon cancer Maternal Uncle   . Stomach cancer Neg Hx     Social History Social History   Tobacco Use  . Smoking status: Never Smoker  . Smokeless tobacco: Never Used  Substance Use Topics  . Alcohol use: No    Comment: rarely, but not  while preg  . Drug use: No     Allergies   Topamax [topiramate]   Review of Systems Review of Systems  Musculoskeletal: Positive for stiffness. Negative for back pain.  All other systems reviewed and are negative.    Physical Exam Triage Vital Signs ED Triage Vitals  Enc Vitals Group     BP 11/03/18 1543 131/83     Pulse Rate 11/03/18 1543  82     Resp 11/03/18 1543 18     Temp --      Temp src --      SpO2 11/03/18 1543 98 %     Weight --      Height --      Head Circumference --      Peak Flow --      Pain Score 11/03/18 1544 8     Pain Loc --      Pain Edu? --      Excl. in [place]? --    No data found.  Updated Vital Signs BP 131/83 (BP Location: Right Arm)   Pulse 82   Resp 18   LMP 08/23/2018 (Exact Date)   SpO2 98%   Visual Acuity Right Eye Distance:   Left Eye Distance:   Bilateral Distance:    Right Eye Near:   Left Eye Near:    Bilateral Near:     Physical Exam Vitals signs and nursing note reviewed.  Constitutional:      General: She is not in acute distress. HENT:     Head: Atraumatic.  Eyes:     Pupils: Pupils are equal, round, and reactive to light.  Neck:     Musculoskeletal: Normal range of motion.  Cardiovascular:     Rate and Rhythm: Normal rate.  Pulmonary:     Effort: Pulmonary effort is normal.  Musculoskeletal:     Right ankle: She exhibits decreased range of motion and swelling. She exhibits no ecchymosis, no deformity, no laceration and normal pulse. Tenderness. Lateral malleolus tenderness found. Achilles tendon normal.       Legs:     Right foot: Tenderness and bony tenderness present.       Feet:     Comments: Right ankle:  Decreased range of motion.  Tenderness and swelling over the lateral malleolus.  Joint stable.  No tenderness over the base of the fifth metatarsal.  Distal neurovascular function is intact. There is distinct tenderness dorsally over the tarsals  as noted on diagram.  Distal neurovascular function is  intact.   Left anterior knee has superficial abrasion as noted on diagram.    Skin:    General: Skin is warm and dry.  Neurological:     Mental Status: She is alert.      UC Treatments / Results  Labs (all labs ordered are listed, but only abnormal results are displayed) Labs Reviewed - No data to display  EKG   Radiology Dg Ankle Complete Right  Result Date: 11/03/2018 CLINICAL DATA:  Fall with ankle pain EXAM: RIGHT ANKLE - COMPLETE 3+ VIEW COMPARISON:  None. FINDINGS: No fracture or malalignment.  Ankle mortise is symmetric. IMPRESSION: No acute osseous abnormality. See separately dictated foot radiograph report Electronically Signed   By: Donavan Foil M.D.   On: 11/03/2018 16:11   Dg Foot Complete Right  Result Date: 11/03/2018 CLINICAL DATA:  Fall with right foot and ankle pain EXAM: RIGHT FOOT COMPLETE - 3+ VIEW COMPARISON:  None. FINDINGS: Mild degenerative changes at the first MTP joint. Acute nondisplaced fracture through the dorsal navicular with extension of lucency to the talonavicular articulation. No dislocation IMPRESSION: Acute nondisplaced fracture involving the dorsal navicular bone with extension of fracture lucency to the talonavicular articulation. Electronically Signed   By: Donavan Foil M.D.   On: 11/03/2018 16:10    Procedures Procedures (including critical care time)  Medications Ordered in UC Medications - No data to display  Initial Impression / Assessment and Plan / UC Course  I have reviewed the triage vital signs and the nursing notes.  Pertinent labs & imaging results that were available during my care of the patient were reviewed by me and considered in my medical decision making (see chart for details).    Ace wrap applied.  Dispensed crutches.  Bacitracin/bandage applied to abrasion left knee. Rx for Lortab (#10, no refill) Controlled Substance Prescriptions I have consulted the [place] Controlled Substances Registry for this patient, and  feel the risk/benefit ratio today is favorable for proceeding with this prescription for a controlled substance.   Followup with Dr. Aundria Mems or Dr. Lynne Leader ([facility]) for management tomorrow.    Final Clinical Impressions(s) / UC Diagnoses   Final diagnoses:  Sprain of right ankle, unspecified ligament, initial encounter  Closed nondisplaced fracture of navicular bone of right foot, initial encounter  Abrasion, left knee, initial encounter     Discharge Instructions     Apply ice pack for 30 minutes every 1 to 2 hours today and tomorrow.  Elevate.  Use crutches and avoid weight bearing.  Wear Ace wrap to control swelling. Apply Bacitracin and bandage to abrasion left knee daily until healed.     ED Prescriptions    Medication Sig Dispense Auth. Provider   HYDROcodone-acetaminophen (NORCO/VICODIN) 5-325 MG tablet Take 1 tablet by mouth every 6 (six) hours as needed for moderate pain or severe pain. 10 tablet Kandra Nicolas, MD         Kandra Nicolas, MD 11/03/18 2232

## 2018-11-04 ENCOUNTER — Encounter: Payer: Self-pay | Admitting: Family Medicine

## 2018-11-04 ENCOUNTER — Ambulatory Visit: Payer: 59 | Admitting: Family Medicine

## 2018-11-04 VITALS — BP 110/80 | Ht 68.0 in

## 2018-11-04 DIAGNOSIS — S92254A Nondisplaced fracture of navicular [scaphoid] of right foot, initial encounter for closed fracture: Secondary | ICD-10-CM

## 2018-11-04 MED ORDER — MUPIROCIN 2 % EX OINT: 1.0000 "application " | TOPICAL_OINTMENT | Freq: Two times a day (BID) | CUTANEOUS | 1 refills | Status: DC

## 2018-11-04 MED FILL — MUPIROCIN 2% OINTMENT: 2 | 15 days supply | Qty: 22 | Fill #0

## 2018-11-04 NOTE — Progress Notes (Signed)
[name] - 43 y.o. female MRN [id number]  Date of birth: 27-Sep-1975  SUBJECTIVE:  Including CC & ROS.  Chief Complaint  Patient presents with  . Foot Injury    right foot x 1 day    [name] is a 43 y.o. female that is presenting with right foot pain.  Yesterday she is walking at the [employer] and tripped and fell.  She was evaluated in the emergency room and was found to have a navicular fracture.  She was placed on crutches.  She denies any numbness or tingling.  Not having any significant swelling or ecchymosis.  Does have pain at the dorsal midfoot.  Some stiffness with plantarflexion and dorsiflexion.  Pain worse with ambulation.  Independent review of the right ankle and foot x-ray from 9/13 shows dorsal navicular nondisplaced fracture   Review of Systems  Constitutional: Negative for fever.  HENT: Negative for congestion.   Respiratory: Negative for cough.   Cardiovascular: Negative for chest pain.  Gastrointestinal: Negative for abdominal pain.  Musculoskeletal: Positive for gait problem.  Skin: Negative for color change.  Neurological: Negative for tremors.  Hematological: Negative for adenopathy.    HISTORY: Past Medical, Surgical, Social, and Family History Reviewed & Updated per EMR.   Pertinent Historical Findings include:  Past Medical History:  Diagnosis Date  . Anemia   . Anxiety   . Chicken pox   . Depression   . GERD (gastroesophageal reflux disease)    tums prn-with pregnancy only  . Headache(784.0)   . IBS (irritable bowel syndrome)   . Migraines   . Placenta previa 2012   with current pregnancy-type and cross 2 Units per protocol  . PONV (postoperative nausea and vomiting)   . Shortness of breath dyspnea    with this pregnancy   . Syringomyelia, acquired [facility])     Past Surgical History:  Procedure Laterality Date  . APPENDECTOMY  2005  . arthroscopic knee  1990  . CESAREAN SECTION  12/27/2010   Procedure: CESAREAN  SECTION;  Surgeon: Shon Millet II;  Location: [facility];  Service: Gynecology;  Laterality: N/A;  . CESAREAN SECTION N/A 02/03/2015   Procedure: CESAREAN SECTION;  Surgeon: Everlene Farrier, MD;  Location: [facility];  Service: Obstetrics;  Laterality: N/A;  . left breast reduction  1996  . TUBAL LIGATION Bilateral 02/03/2015   Procedure: BILATERAL TUBAL LIGATION;  Surgeon: Everlene Farrier, MD;  Location: [facility];  Service: Obstetrics;  Laterality: Bilateral;  . WISDOM TOOTH EXTRACTION      Allergies  Allergen Reactions  . Topamax [Topiramate] Other (See Comments)    Pt states that she has trouble recalling words when she takes this medication.    Family History  Problem Relation Age of Onset  . High blood pressure Mother   . Arthritis Mother   . Hypertension Mother   . Other Mother        "Multisystem atrophy"  . Clotting disorder Mother   . Irritable bowel syndrome Mother   . Alcohol abuse Father   . Arthritis Father   . Colon polyps Father   . High blood pressure Brother   . Prostate cancer Maternal Grandfather   . Colon cancer Maternal Uncle   . Stomach cancer Neg Hx      Social History   Socioeconomic History  . Marital status: Married    Spouse name: Not on file  . Number of children: 3  . Years of education: Not on file  .  Highest education level: Not on file  Occupational History  . Occupation: Therapist, sports  Social Needs  . Financial resource strain: Not on file  . Food insecurity    Worry: Not on file    Inability: Not on file  . Transportation needs    Medical: Not on file    Non-medical: Not on file  Tobacco Use  . Smoking status: Never Smoker  . Smokeless tobacco: Never Used  Substance and Sexual Activity  . Alcohol use: No    Comment: rarely, but not while preg  . Drug use: No  . Sexual activity: Yes  Lifestyle  . Physical activity    Days per week: Not on file    Minutes per session: Not on file  . Stress: Not on file  Relationships  . Social Product manager on phone: Not on file    Gets together: Not on file    Attends religious service: Not on file    Active member of club or organization: Not on file    Attends meetings of clubs or organizations: Not on file    Relationship status: Not on file  . Intimate partner violence    Fear of current or ex partner: Not on file    Emotionally abused: Not on file    Physically abused: Not on file    Forced sexual activity: Not on file  Other Topics Concern  . Not on file  Social History Narrative   Patient lives at home with her husband [name]). 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   Daughter born 2012   Son 2006 (special needs)- has uncontrolled seizures   Married   Therapist, sports     PHYSICAL EXAM:  VS: BP 110/80   Ht 5\' 8"  (1.727 m)   BMI 34.36 kg/m  Physical Exam Gen: NAD, alert, cooperative with exam, well-appearing ENT: normal lips, normal nasal mucosa,  Eye: normal EOM, normal conjunctiva and lids CV:  no edema, +2 pedal pulses   Resp: no accessory muscle use, non-labored,  Skin: no rashes, no areas of induration  Neuro: normal tone, normal sensation to touch Psych:  normal insight, alert and oriented MSK:  Right foot/ankle:  No obvious swelling or ecchymosis. Limited range of motion secondary to pain in plantarflexion and dorsiflexion. Tenderness palpation over the dorsal navicular. No tenderness palpation over the medial aspect of the navicular. Neurovascular intact     ASSESSMENT & PLAN:   Closed nondisplaced fracture of navicular bone of right foot Sustained on 9/13.   -Provided cam walker today. -Counseled on supportive care and range of motion exercises. -Counseled on vitamin K 2. -May need to consider a scooter to help with nonweightbearing. -Follow-up in 1 week.

## 2018-11-04 NOTE — Assessment & Plan Note (Signed)
Sustained on 9/13.   -Provided cam walker today. -Counseled on supportive care and range of motion exercises. -Counseled on vitamin K 2. -May need to consider a scooter to help with nonweightbearing. -Follow-up in 1 week.

## 2018-11-04 NOTE — Patient Instructions (Signed)
Nice to meet you Please try ice  Please try the range of motion when not in the boot  Please let me know if you would like to try a scooter  Please try to only slightly weight bear on the right side   Please try vitamin k2 and vitamin D3.  Please send me a message in MyChart with any questions or updates.  Please see me back in 1 week.   --Dr. Raeford Razor

## 2018-11-11 ENCOUNTER — Ambulatory Visit: Payer: 59 | Admitting: Family Medicine

## 2018-11-11 ENCOUNTER — Ambulatory Visit (HOSPITAL_BASED_OUTPATIENT_CLINIC_OR_DEPARTMENT_OTHER)
Admission: RE | Admit: 2018-11-11 | Discharge: 2018-11-11 | Disposition: A | Discharge status: Home / Self Care | Payer: 59 | Source: Ambulatory Visit | Attending: Family Medicine | Admitting: Family Medicine

## 2018-11-11 ENCOUNTER — Encounter: Payer: Self-pay | Admitting: Family Medicine

## 2018-11-11 ENCOUNTER — Other Ambulatory Visit: Payer: Self-pay

## 2018-11-11 VITALS — BP 129/75 | Ht 68.0 in

## 2018-11-11 DIAGNOSIS — S92254D Nondisplaced fracture of navicular [scaphoid] of right foot, subsequent encounter for fracture with routine healing: Secondary | ICD-10-CM | POA: Diagnosis not present

## 2018-11-11 DIAGNOSIS — S92254A Nondisplaced fracture of navicular [scaphoid] of right foot, initial encounter for closed fracture: Secondary | ICD-10-CM | POA: Insufficient documentation

## 2018-11-11 DIAGNOSIS — S92251A Displaced fracture of navicular [scaphoid] of right foot, initial encounter for closed fracture: Secondary | ICD-10-CM | POA: Diagnosis not present

## 2018-11-11 NOTE — Patient Instructions (Signed)
Good to see you Please use ice and tylenol  Please continue the boot  I will call you with the results from today Please send me a message in MyChart with any questions or updates.  Please see me back in 4 weeks.   --Dr. Raeford Razor

## 2018-11-11 NOTE — Assessment & Plan Note (Signed)
Initial injury on 9/13.  Has been avoiding weightbearing as much as she can.  Start having some new ecchymosis and has mild pain is ongoing. -X-ray. -Counseled on supportive care. -Continue the cam walker. -Follow-up in 4 weeks.  Could consider stopping the cam walker

## 2018-11-11 NOTE — Progress Notes (Signed)
[name] - 43 y.o. female MRN [id number]  Date of birth: [birth year]  SUBJECTIVE:  Including CC & ROS.  Chief Complaint  Patient presents with  . Follow-up    follow up for right foot    [name] is a 43 y.o. female that is following up for her navicular fracture.  She has been in the cam walker and trying to be as nonweightbearing as she can be.  She has noticed bruising develop in the lateral component of the foot.  This started about 2 days ago.  She does endorse pain over the medial aspect of the dorsal midfoot.  Has been coming out of the boot to help with range of motion and avoid stiffness.  No numbness or tingling.    Review of Systems  Constitutional: Negative for fever.  HENT: Negative for congestion.   Respiratory: Negative for cough.   Cardiovascular: Negative for chest pain.  Gastrointestinal: Negative for abdominal pain.  Musculoskeletal: Positive for gait problem.  Skin: Positive for color change.  Neurological: Negative for weakness.  Hematological: Negative for adenopathy.    HISTORY: Past Medical, Surgical, Social, and Family History Reviewed & Updated per EMR.   Pertinent Historical Findings include:  Past Medical History:  Diagnosis Date  . Anemia   . Anxiety   . Chicken pox   . Depression   . GERD (gastroesophageal reflux disease)    tums prn-with pregnancy only  . Headache(784.0)   . IBS (irritable bowel syndrome)   . Migraines   . Placenta previa 2012   with current pregnancy-type and cross 2 Units per protocol  . PONV (postoperative nausea and vomiting)   . Shortness of breath dyspnea    with this pregnancy   . Syringomyelia, acquired [facility])     Past Surgical History:  Procedure Laterality Date  . APPENDECTOMY  2005  . arthroscopic knee  1990  . CESAREAN SECTION  12/27/2010   Procedure: CESAREAN SECTION;  Surgeon: Shon Millet II;  Location: [facility];  Service: Gynecology;  Laterality: N/A;  . CESAREAN SECTION N/A 02/03/2015    Procedure: CESAREAN SECTION;  Surgeon: Everlene Farrier, MD;  Location: [facility];  Service: Obstetrics;  Laterality: N/A;  . left breast reduction  1996  . TUBAL LIGATION Bilateral 02/03/2015   Procedure: BILATERAL TUBAL LIGATION;  Surgeon: Everlene Farrier, MD;  Location: [facility];  Service: Obstetrics;  Laterality: Bilateral;  . WISDOM TOOTH EXTRACTION      Allergies  Allergen Reactions  . Topamax [Topiramate] Other (See Comments)    Pt states that she has trouble recalling words when she takes this medication.    Family History  Problem Relation Age of Onset  . High blood pressure Mother   . Arthritis Mother   . Hypertension Mother   . Other Mother        "Multisystem atrophy"  . Clotting disorder Mother   . Irritable bowel syndrome Mother   . Alcohol abuse Father   . Arthritis Father   . Colon polyps Father   . High blood pressure Brother   . Prostate cancer Maternal Grandfather   . Colon cancer Maternal Uncle   . Stomach cancer Neg Hx      Social History   Socioeconomic History  . Marital status: Married    Spouse name: Not on file  . Number of children: 3  . Years of education: Not on file  . Highest education level: Not on file  Occupational History  .  Occupation: Therapist, sports  Social Needs  . Financial resource strain: Not on file  . Food insecurity    Worry: Not on file    Inability: Not on file  . Transportation needs    Medical: Not on file    Non-medical: Not on file  Tobacco Use  . Smoking status: Never Smoker  . Smokeless tobacco: Never Used  Substance and Sexual Activity  . Alcohol use: No    Comment: rarely, but not while preg  . Drug use: No  . Sexual activity: Yes  Lifestyle  . Physical activity    Days per week: Not on file    Minutes per session: Not on file  . Stress: Not on file  Relationships  . Social Herbalist on phone: Not on file    Gets together: Not on file    Attends religious service: Not on file    Active member of club or  organization: Not on file    Attends meetings of clubs or organizations: Not on file    Relationship status: Not on file  . Intimate partner violence    Fear of current or ex partner: Not on file    Emotionally abused: Not on file    Physically abused: Not on file    Forced sexual activity: Not on file  Other Topics Concern  . Not on file  Social History Narrative   Patient lives at home with her husband [name]). 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   Daughter born 2012   Son 2006 (special needs)- has uncontrolled seizures   Married   Therapist, sports     PHYSICAL EXAM:  VS: BP 129/75   Ht 5\' 8"  (1.727 m)   BMI 34.36 kg/m  Physical Exam Gen: NAD, alert, cooperative with exam, well-appearing ENT: normal lips, normal nasal mucosa,  Eye: normal EOM, normal conjunctiva and lids CV:  no edema, +2 pedal pulses   Resp: no accessory muscle use, non-labored,   Skin: no rashes, no areas of induration  Neuro: normal tone, normal sensation to touch Psych:  normal insight, alert and oriented MSK:  Right foot: Ecchymosis over the lateral foot. Some stiffness with dorsiflexion and plantarflexion. Tenderness palpation of the navicular. Neurovascularly intact     ASSESSMENT & PLAN:   Closed nondisplaced fracture of navicular bone of right foot Initial injury on 9/13.  Has been avoiding weightbearing as much as she can.  Start having some new ecchymosis and has mild pain is ongoing. -X-ray. -Counseled on supportive care. -Continue the cam walker. -Follow-up in 4 weeks.  Could consider stopping the cam walker

## 2018-11-12 ENCOUNTER — Telehealth: Payer: Self-pay | Admitting: Family Medicine

## 2018-11-12 NOTE — Telephone Encounter (Signed)
Informed of results.   Rosemarie Ax, MD [facility] 11/12/2018, 10:18 AM

## 2018-11-13 ENCOUNTER — Encounter: Payer: Self-pay | Admitting: Family Medicine

## 2018-11-22 ENCOUNTER — Telehealth: Payer: 59 | Admitting: Nurse Practitioner

## 2018-11-22 DIAGNOSIS — N3000 Acute cystitis without hematuria: Secondary | ICD-10-CM | POA: Diagnosis not present

## 2018-11-22 MED ORDER — CEPHALEXIN 500 MG PO CAPS: 500.0000 mg | ORAL_CAPSULE | Freq: Two times a day (BID) | ORAL | 0 refills | Status: DC

## 2018-11-22 NOTE — Progress Notes (Signed)

## 2018-11-27 ENCOUNTER — Encounter: Payer: Self-pay | Admitting: Family Medicine

## 2018-12-13 ENCOUNTER — Ambulatory Visit (HOSPITAL_BASED_OUTPATIENT_CLINIC_OR_DEPARTMENT_OTHER)
Admission: RE | Admit: 2018-12-13 | Discharge: 2018-12-13 | Disposition: A | Discharge status: Home / Self Care | Payer: 59 | Source: Ambulatory Visit | Attending: Family Medicine | Admitting: Family Medicine

## 2018-12-13 ENCOUNTER — Encounter: Payer: Self-pay | Admitting: Family Medicine

## 2018-12-13 ENCOUNTER — Other Ambulatory Visit: Payer: Self-pay

## 2018-12-13 ENCOUNTER — Ambulatory Visit: Payer: 59 | Admitting: Family Medicine

## 2018-12-13 VITALS — BP 114/77 | HR 84 | Ht 68.0 in | Wt 210.0 lb

## 2018-12-13 DIAGNOSIS — S92254D Nondisplaced fracture of navicular [scaphoid] of right foot, subsequent encounter for fracture with routine healing: Secondary | ICD-10-CM | POA: Diagnosis not present

## 2018-12-13 DIAGNOSIS — S92254A Nondisplaced fracture of navicular [scaphoid] of right foot, initial encounter for closed fracture: Secondary | ICD-10-CM | POA: Insufficient documentation

## 2018-12-13 NOTE — Patient Instructions (Signed)
Good to see you Please try not using the Boot  Physical therapy should give you a call  I will call with the results from today  Please send me a message in [place] with any questions or updates.  Please see [place] back in a few weeks if the foot is still painful or as needed.   --Dr. Raeford Razor

## 2018-12-13 NOTE — Progress Notes (Signed)
[name] - 43 y.o. female MRN [id number]  Date of birth: September 07, 1975  SUBJECTIVE:  Including CC & ROS.  Chief Complaint  Patient presents with  . Follow-up    follow up for right foot    [name] is a 43 y.o. female that is following up for her fracture.  She experiences pain and swelling intermittently but this is mostly on the lateral aspect of the foot.  She denies any pain over the dorsal midfoot area.  Pain occurs intermittently.  Still using the cam walker.  Pain is mild and dull.    Review of Systems  Constitutional: Negative for fever.  HENT: Negative for congestion.   Respiratory: Negative for cough.   Cardiovascular: Negative for chest pain.  Gastrointestinal: Negative for abdominal pain.  Musculoskeletal: Positive for gait problem.  Skin: Negative for color change.  Neurological: Negative for weakness.  Hematological: Negative for adenopathy.    HISTORY: Past Medical, Surgical, Social, and Family History Reviewed & Updated per EMR.   Pertinent Historical Findings include:  Past Medical History:  Diagnosis Date  . Anemia   . Anxiety   . Chicken pox   . Depression   . GERD (gastroesophageal reflux disease)    tums prn-with pregnancy only  . Headache(784.0)   . IBS (irritable bowel syndrome)   . Migraines   . Placenta previa 2012   with current pregnancy-type and cross 2 Units per protocol  . PONV (postoperative nausea and vomiting)   . Shortness of breath dyspnea    with this pregnancy   . Syringomyelia, acquired [facility])     Past Surgical History:  Procedure Laterality Date  . APPENDECTOMY  2005  . arthroscopic knee  1990  . CESAREAN SECTION  12/27/2010   Procedure: CESAREAN SECTION;  Surgeon: Shon Millet II;  Location: [facility];  Service: Gynecology;  Laterality: N/A;  . CESAREAN SECTION N/A 02/03/2015   Procedure: CESAREAN SECTION;  Surgeon: Everlene Farrier, MD;  Location: [facility];  Service: Obstetrics;  Laterality: N/A;  . left breast  reduction  1996  . TUBAL LIGATION Bilateral 02/03/2015   Procedure: BILATERAL TUBAL LIGATION;  Surgeon: Everlene Farrier, MD;  Location: [facility];  Service: Obstetrics;  Laterality: Bilateral;  . WISDOM TOOTH EXTRACTION      Allergies  Allergen Reactions  . Topamax [Topiramate] Other (See Comments)    Pt states that she has trouble recalling words when she takes this medication.    Family History  Problem Relation Age of Onset  . High blood pressure Mother   . Arthritis Mother   . Hypertension Mother   . Other Mother        "Multisystem atrophy"  . Clotting disorder Mother   . Irritable bowel syndrome Mother   . Alcohol abuse Father   . Arthritis Father   . Colon polyps Father   . High blood pressure Brother   . Prostate cancer Maternal Grandfather   . Colon cancer Maternal Uncle   . Stomach cancer Neg Hx      Social History   Socioeconomic History  . Marital status: Married    Spouse name: Not on file  . Number of children: 3  . Years of education: Not on file  . Highest education level: Not on file  Occupational History  . Occupation: Therapist, sports  Social Needs  . Financial resource strain: Not on file  . Food insecurity    Worry: Not on file    Inability: Not on  file  . Transportation needs    Medical: Not on file    Non-medical: Not on file  Tobacco Use  . Smoking status: Never Smoker  . Smokeless tobacco: Never Used  Substance and Sexual Activity  . Alcohol use: No    Comment: rarely, but not while preg  . Drug use: No  . Sexual activity: Yes  Lifestyle  . Physical activity    Days per week: Not on file    Minutes per session: Not on file  . Stress: Not on file  Relationships  . Social Herbalist on phone: Not on file    Gets together: Not on file    Attends religious service: Not on file    Active member of club or organization: Not on file    Attends meetings of clubs or organizations: Not on file    Relationship status: Not on file  . Intimate  partner violence    Fear of current or ex partner: Not on file    Emotionally abused: Not on file    Physically abused: Not on file    Forced sexual activity: Not on file  Other Topics Concern  . Not on file  Social History Narrative   Patient lives at home with her husband [name]). 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   Daughter born 2012   Son 2006 (special needs)- has uncontrolled seizures   Married   Therapist, sports     PHYSICAL EXAM:  VS: BP 114/77   Pulse 84   Ht 5\' 8"  (1.727 m)   Wt 210 lb (95.3 kg)   LMP 11/29/2018   BMI 31.93 kg/m  Physical Exam Gen: NAD, alert, cooperative with exam, well-appearing ENT: normal lips, normal nasal mucosa,  Eye: normal EOM, normal conjunctiva and lids CV:  no edema, +2 pedal pulses   Resp: no accessory muscle use, non-labored,  Skin: no rashes, no areas of induration  Neuro: normal tone, normal sensation to touch Psych:  normal insight, alert and oriented MSK:  Right foot:  No swelling or ecchymosis. No tenderness to palpation over the navicular. Normal ankle range of motion. Normal strength resistance. Pain with manipulation of the cuboid. Neurovascularly intact     ASSESSMENT & PLAN:   Closed nondisplaced fracture of navicular bone of right foot Initial injury was on 9/13.  Has been in the cam walker since that time.  Pain is likely more associated with the cuboid as it may be not moving well since she has been in the CAM walker for about 6 weeks  - xray  -Counseled on supportive care. -Counseled on coming out of the cam walker. -Referral to physical therapy for cuboid manipulation.

## 2018-12-16 ENCOUNTER — Telehealth: Payer: Self-pay | Admitting: Family Medicine

## 2018-12-16 NOTE — Telephone Encounter (Signed)
Spoke with patient about xrays.   Rosemarie Ax, MD [facility] 12/16/2018, 8:17 AM

## 2018-12-16 NOTE — Assessment & Plan Note (Signed)
Initial injury was on 9/13.  Has been in the cam walker since that time.  Pain is likely more associated with the cuboid as it may be not moving well since she has been in the CAM walker for about 6 weeks  - xray  -Counseled on supportive care. -Counseled on coming out of the cam walker. -Referral to physical therapy for cuboid manipulation.

## 2018-12-30 ENCOUNTER — Ambulatory Visit: Payer: 59 | Attending: Family Medicine | Admitting: Physical Therapy

## 2018-12-30 ENCOUNTER — Encounter: Payer: Self-pay | Admitting: Physical Therapy

## 2018-12-30 ENCOUNTER — Other Ambulatory Visit: Payer: Self-pay

## 2018-12-30 DIAGNOSIS — R262 Difficulty in walking, not elsewhere classified: Secondary | ICD-10-CM

## 2018-12-30 DIAGNOSIS — M25674 Stiffness of right foot, not elsewhere classified: Secondary | ICD-10-CM

## 2018-12-30 DIAGNOSIS — M6281 Muscle weakness (generalized): Secondary | ICD-10-CM

## 2018-12-30 DIAGNOSIS — R2689 Other abnormalities of gait and mobility: Secondary | ICD-10-CM

## 2018-12-30 DIAGNOSIS — M25571 Pain in right ankle and joints of right foot: Secondary | ICD-10-CM

## 2018-12-30 DIAGNOSIS — R600 Localized edema: Secondary | ICD-10-CM

## 2018-12-30 NOTE — Therapy (Signed)
[facility] [place] [address]  [place], [place], [address] Phone: [phone]   Fax:  [phone]  Physical Therapy Evaluation  Patient Details  Name: [name] MRN: [id number] Date of Birth: [birth year] Referring Provider (PT): Clearance Coots, MD   Encounter Date: 12/30/2018  PT End of Session - 12/30/18 0847    Visit Number  1    Number of Visits  8    Date for PT Re-Evaluation  02/24/19    Authorization Type  [facility]    PT Start Time  0847    PT Stop Time  0941    PT Time Calculation (min)  54 min    Activity Tolerance  Patient tolerated treatment well    Behavior During Therapy  [facility] for tasks assessed/performed       Past Medical History:  Diagnosis Date  . Anemia   . Anxiety   . Chicken pox   . Depression   . GERD (gastroesophageal reflux disease)    tums prn-with pregnancy only  . Headache(784.0)   . IBS (irritable bowel syndrome)   . Migraines   . Placenta previa 2012   with current pregnancy-type and cross 2 Units per protocol  . PONV (postoperative nausea and vomiting)   . Shortness of breath dyspnea    with this pregnancy   . Syringomyelia, acquired [facility])     Past Surgical History:  Procedure Laterality Date  . APPENDECTOMY  2005  . arthroscopic knee  1990  . CESAREAN SECTION  12/27/2010   Procedure: CESAREAN SECTION;  Surgeon: Shon Millet II;  Location: [facility];  Service: Gynecology;  Laterality: N/A;  . CESAREAN SECTION N/A 02/03/2015   Procedure: CESAREAN SECTION;  Surgeon: Everlene Farrier, MD;  Location: [place] [facility];  Service: Obstetrics;  Laterality: N/A;  . left breast reduction  1996  . TUBAL LIGATION Bilateral 02/03/2015   Procedure: BILATERAL TUBAL LIGATION;  Surgeon: Everlene Farrier, MD;  Location: [place];  Service: Obstetrics;  Laterality: Bilateral;  . WISDOM TOOTH EXTRACTION      There were no vitals filed for this visit.   Subjective Assessment - 12/30/18 0850    Subjective   Pt reporting R navicular fracture with concurrent ankle sprain on 11/03/18. Notes delayed swelling by almost 2 weeks after initial injury. Boot x 6 wks, initially NWB using knee scooter then heel weight bearing. Out of the boot since late October (~10/23). Reports fracture healed but now having pain and swelling with prolonged standing, walking and stairs. States her husband is an OT at [employer] and he has been taping her ankle which has given her some relief.    How long can you stand comfortably?  30-60 minutes    How long can you walk comfortably?  almost always hurts    Diagnostic tests  12/13/18 - R foot x-ray: A nondisplaced dorsal fracture of the right navicular is not optimally appreciated on lateral view, however there appears to be substantial evidence of interval healing with decreased lucency of the fracture line. No other fracture or dislocation of the right foot.    Patient Stated Goals  "to relieve some of the pain and strengthening the foot and ankle to allow increased comfort and normal gait pattern with walking"    Currently in Pain?  Yes    Pain Score  4     Pain Location  Ankle    Pain Orientation  Right;Lateral;Medial;Upper    Pain Descriptors /  Indicators  Aching    Pain Type  Acute pain    Pain Radiating Towards  n/a    Pain Onset  More than a month ago    Pain Frequency  Intermittent    Aggravating Factors   walking, stairs, prolonged standing    Pain Relieving Factors  taping    Effect of Pain on Daily Activities  works through pain         [facility] PT Assessment - 12/30/18 0847      Assessment   Medical Diagnosis  R foot & ankle pain - Closed nondisplaced fracture of R navicular bone    Referring Provider (PT)  Clearance Coots, MD    Onset Date/Surgical Date  11/03/18    Next MD Visit  PRN    Prior Therapy  none for current condition      Precautions   Precautions  None      Restrictions   Weight Bearing Restrictions  Yes    RLE Weight Bearing  Weight bearing  as tolerated      Balance Screen   Has the patient fallen in the past 6 months  Yes    How many times?  1   mis-step on a curb   Has the patient had a decrease in activity level because of a fear of falling?   No    Is the patient reluctant to leave their home because of a fear of falling?   No      Home Social worker  Private residence    Living Arrangements  Spouse/significant other;Children    Type of [place] to enter    Entrance Stairs-Number of Steps  "a couple"    Entrance Stairs-Rails  Right    Home Layout  Two level;Able to live on main level with bedroom/bathroom   office & bonus room upstairs     Prior Function   Level of Independence  Independent    Vocation  Part time employment    Vocation Requirements  RN case manager    Leisure  keeping up with kids; walking 3 days/wk      Cognition   Overall Cognitive Status  Within Functional Limits for tasks assessed      Observation/Other Assessments   Focus on Therapeutic Outcomes (FOTO)   Foot - 55% (45% limitation); Predicted 74% (26% limitation)      ROM / Strength   AROM / PROM / Strength  AROM;PROM;Strength      AROM   AROM Assessment Site  Ankle    Right/Left Ankle  Right;Left    Right Ankle Dorsiflexion  8    Right Ankle Plantar Flexion  30    Right Ankle Inversion  24    Right Ankle Eversion  18    Left Ankle Dorsiflexion  14    Left Ankle Plantar Flexion  52    Left Ankle Inversion  34    Left Ankle Eversion  22      PROM   PROM Assessment Site  Ankle    Right/Left Ankle  Right    Right Ankle Dorsiflexion  10    Right Ankle Plantar Flexion  41    Right Ankle Inversion  32    Right Ankle Eversion  24      Strength   Strength Assessment Site  Ankle;Knee;Hip    Right/Left Hip  Right;Left    Right Hip Flexion  4-/5  Right Hip Extension  4/5    Right Hip External Rotation   4/5    Right Hip Internal Rotation  4/5    Right Hip ABduction  4-/5    Right  Hip ADduction  4/5    Left Hip Flexion  4/5    Left Hip Extension  4+/5    Left Hip External Rotation  4/5    Left Hip Internal Rotation  4/5    Left Hip ABduction  4/5    Left Hip ADduction  4-/5    Right/Left Knee  Right;Left    Right Knee Flexion  4+/5    Right Knee Extension  5/5    Left Knee Flexion  5/5    Left Knee Extension  5/5    Right/Left Ankle  Right;Left    Right Ankle Dorsiflexion  4-/5    Right Ankle Plantar Flexion  3-/5   in R SLS   Right Ankle Inversion  3+/5    Right Ankle Eversion  3+/5    Left Ankle Dorsiflexion  5/5    Left Ankle Plantar Flexion  5/5    Left Ankle Inversion  5/5    Left Ankle Eversion  5/5      Palpation   Palpation comment  ttp over R navicular bone as well as medial and lateral ankle ligaments and inferior extensor retinaculum      Ambulation/Gait   Ambulation/Gait  Yes    Ambulation/Gait Assistance  7: Independent    Assistive device  None    Gait Pattern  Antalgic;Decreased weight shift to right;Decreased stance time - right    Ambulation Surface  Level;Indoor                Objective measurements completed on examination: See above findings.      [facility] Adult PT Treatment/Exercise - 12/30/18 0847      Exercises   Exercises  Ankle      Ankle Exercises: Stretches   Plantar Fascia Stretch  30 seconds;1 rep    Plantar Fascia Stretch Limitations  seated (unable to tolerate standing with toes on baseboard)    Soleus Stretch  30 seconds;1 rep    Gastroc Stretch  30 seconds;1 rep    Other Stretch  R ankle PF stretch x 30 sec      Ankle Exercises: Seated   Other Seated Ankle Exercises  R ankle 4-way with yellow TB x 10             PT Education - 12/30/18 0941    Education Details  PT eval findings, anticipated POC & initial HEP    Person(s) Educated  Patient    Methods  Explanation;Demonstration;Handout    Comprehension  Verbalized understanding;Returned demonstration;Need further instruction       PT  Short Term Goals - 12/30/18 0941      PT SHORT TERM GOAL #1   Title  Patient will be independent with initial HEP    Status  New    Target Date  01/13/19        PT Long Term Goals - 12/30/18 0941      PT LONG TERM GOAL #1   Title  Patient will be independent with ongoing/advanced HEP    Status  New    Target Date  02/24/19      PT LONG TERM GOAL #2   Title  Patient to improve R foot and ankle AROM to WNL without pain provocation    Status  New  Target Date  02/24/19      PT LONG TERM GOAL #3   Title  Patient will improved R foot and ankle strength to >/= 4+/5 for improved stability and activity tolerance    Status  New    Target Date  02/24/19      PT LONG TERM GOAL #4   Title  Patient to report ability to stand and ambulate to perform ADLs, household and work-related tasks without increased R foot or ankle pain    Status  New    Target Date  02/24/19             Plan - 12/30/18 0941    Clinical Impression Statement  Latori is a 43 y/o female referred to OP PT for cuboid manipulation R foot and ankle pain s/p closed nondisplaced fracture of the R navicular bone on 11/03/18. She reports injury occurred as a result of a misstep on a curb with delayed onset of swelling and ecchymosis by almost 2 weeks. Recent x-rays reveal good fracture healing, but patient continues to experience lateral, medial and upper ankle pain resulting in limited standing and walking tolerance. R ankle AROM limited in all planes along with decreased midfoot mobility and increased tightness in gastroc/soleus complex as well as quadratus plantae/plantar fascia. R ankle strength limited as compared to L, most significant in PF. Ellawyn will benefit from skilled PT to restore functional ROM and strength in R foot and ankle for improved walking and activity tolerance.    Personal Factors and Comorbidities  Comorbidity 3+    Comorbidities  Syringomyelia, migraines, anxiety, depression     Examination-Activity Limitations  Caring for Others;Carry;Locomotion Level;Stairs;Stand    Examination-Participation Restrictions  Community Activity;Driving    Stability/Clinical Decision Making  Stable/Uncomplicated    Clinical Decision Making  Low    Rehab Potential  Good    PT Frequency  2x / week   1-2x/wk - pt wishing to try 1x/wk due to childcare issues   PT Duration  8 weeks    PT Treatment/Interventions  ADLs/Self Care Home Management;Cryotherapy;Electrical Stimulation;Iontophoresis 4mg /ml Dexamethasone;Moist Heat;Ultrasound;Gait training;Stair training;Functional mobility training;Therapeutic activities;Therapeutic exercise;Balance training;Neuromuscular re-education;Patient/family education;Manual techniques;Passive range of motion;Dry needling;Taping;Vasopneumatic Device;Joint Manipulations    PT Next Visit Plan  review initial HEP; R ankle ROM, esp midfoot mobility; R ankle and proximal LE strengthening; balance and propriocpetive training; manual therapy and modalities PRN    PT Home Exercise Plan  12/30/18 - gastroc/soleus, PF and plantar fasica stretches, 4-way ankle with yellow TB    Consulted and Agree with Plan of Care  Patient       Patient will benefit from skilled therapeutic intervention in order to improve the following deficits and impairments:  Abnormal gait, Decreased activity tolerance, Decreased balance, Decreased coordination, Decreased endurance, Decreased mobility, Decreased range of motion, Decreased strength, Difficulty walking, Hypomobility, Increased muscle spasms, Impaired flexibility, Improper body mechanics, Pain  Visit Diagnosis: Pain in right ankle and joints of right foot  Stiffness of right foot, not elsewhere classified  Difficulty in walking, not elsewhere classified  Other abnormalities of gait and mobility  Muscle weakness (generalized)  Localized edema     Problem List Patient Active Problem List   Diagnosis Date Noted  . Closed  nondisplaced fracture of navicular bone of right foot 11/04/2018  . IDA (iron deficiency anemia) 06/26/2017  . Iron deficiency anemia due to chronic blood loss 06/26/2017  . B12 deficiency 03/30/2017  . Asthma 12/08/2016  . Scalp lesion 01/01/2014  . Allergic rhinitis  11/11/2013  . Daytime somnolence 09/04/2013  . Syringomyelia ([place]) 05/09/2013  . Mass 05/09/2013  . Paresthesia 07/19/2012  . Depression 07/19/2012  . Sensory disturbance 06/27/2012  . Leg weakness 06/27/2012  . Leukocytosis 06/27/2012  . Microcytic anemia 06/27/2012    Percival Spanish, PT, MPT 12/30/2018, 6:40 PM  [facility] [address]  [place], [place], [address] Phone: [phone]   Fax:  [phone]  Name: MAELENE BANKEY MRN: [id number] Date of Birth: [birth year]

## 2019-01-06 ENCOUNTER — Ambulatory Visit: Payer: 59

## 2019-01-08 ENCOUNTER — Ambulatory Visit: Payer: 59

## 2019-01-09 DIAGNOSIS — G4700 Insomnia, unspecified: Secondary | ICD-10-CM | POA: Diagnosis not present

## 2019-01-09 DIAGNOSIS — K580 Irritable bowel syndrome with diarrhea: Secondary | ICD-10-CM | POA: Diagnosis not present

## 2019-01-09 DIAGNOSIS — R5383 Other fatigue: Secondary | ICD-10-CM | POA: Diagnosis not present

## 2019-01-13 ENCOUNTER — Ambulatory Visit: Payer: 59 | Admitting: Physical Therapy

## 2019-01-20 ENCOUNTER — Ambulatory Visit: Payer: 59

## 2019-01-20 ENCOUNTER — Other Ambulatory Visit: Payer: Self-pay

## 2019-01-20 DIAGNOSIS — R600 Localized edema: Secondary | ICD-10-CM | POA: Diagnosis not present

## 2019-01-20 DIAGNOSIS — M25571 Pain in right ankle and joints of right foot: Secondary | ICD-10-CM

## 2019-01-20 DIAGNOSIS — M6281 Muscle weakness (generalized): Secondary | ICD-10-CM | POA: Diagnosis not present

## 2019-01-20 DIAGNOSIS — R262 Difficulty in walking, not elsewhere classified: Secondary | ICD-10-CM | POA: Diagnosis not present

## 2019-01-20 DIAGNOSIS — R2689 Other abnormalities of gait and mobility: Secondary | ICD-10-CM

## 2019-01-20 DIAGNOSIS — M25674 Stiffness of right foot, not elsewhere classified: Secondary | ICD-10-CM

## 2019-01-20 NOTE — Therapy (Signed)
[facility] [address]  [place], [place], [address] Phone: [phone]   Fax:  [phone]  Physical Therapy Treatment  Patient Details  Name: [name] MRN: [id number] Date of Birth: 07-24-1975 Referring Provider (PT): Clearance Coots, MD   Encounter Date: 01/20/2019  PT End of Session - 01/20/19 0901    Visit Number  2    Number of Visits  8    Date for PT Re-Evaluation  02/24/19    Authorization Type  [facility]    PT Start Time  0845    PT Stop Time  0933    PT Time Calculation (min)  48 min    Activity Tolerance  Patient tolerated treatment well    Behavior During Therapy  [facility] for tasks assessed/performed       Past Medical History:  Diagnosis Date  . Anemia   . Anxiety   . Chicken pox   . Depression   . GERD (gastroesophageal reflux disease)    tums prn-with pregnancy only  . Headache(784.0)   . IBS (irritable bowel syndrome)   . Migraines   . Placenta previa 2012   with current pregnancy-type and cross 2 Units per protocol  . PONV (postoperative nausea and vomiting)   . Shortness of breath dyspnea    with this pregnancy   . Syringomyelia, acquired [facility])     Past Surgical History:  Procedure Laterality Date  . APPENDECTOMY  2005  . arthroscopic knee  1990  . CESAREAN SECTION  12/27/2010   Procedure: CESAREAN SECTION;  Surgeon: Shon Millet II;  Location: [facility];  Service: Gynecology;  Laterality: N/A;  . CESAREAN SECTION N/A 02/03/2015   Procedure: CESAREAN SECTION;  Surgeon: Everlene Farrier, MD;  Location: [facility];  Service: Obstetrics;  Laterality: N/A;  . left breast reduction  1996  . TUBAL LIGATION Bilateral 02/03/2015   Procedure: BILATERAL TUBAL LIGATION;  Surgeon: Everlene Farrier, MD;  Location: [facility] [facility];  Service: Obstetrics;  Laterality: Bilateral;  . WISDOM TOOTH EXTRACTION      There were no vitals filed for this visit.  Subjective Assessment - 01/20/19 0851    Subjective   Pt. noting complaint of " tightness " in R ankle over this past week.    Diagnostic tests  12/13/18 - R foot x-ray: A nondisplaced dorsal fracture of the right navicular is not optimally appreciated on lateral view, however there appears to be substantial evidence of interval healing with decreased lucency of the fracture line. No other fracture or dislocation of the right foot.    Patient Stated Goals  "to relieve some of the pain and strengthening the foot and ankle to allow increased comfort and normal gait pattern with walking"    Currently in Pain?  Yes    Pain Score  0-No pain   up to 4/10 " tightness "   Pain Location  Ankle    Pain Orientation  Right;Lateral    Pain Descriptors / Indicators  Tightness    Pain Type  Acute pain    Pain Onset  More than a month ago    Pain Frequency  Intermittent    Aggravating Factors   walking    Multiple Pain Sites  No                       [facility] Adult PT Treatment/Exercise - 01/20/19 0001      Self-Care   Self-Care  Other Self-Care Comments    Other Self-Care Comments   discussed use of elliptical as pt. wanting to purchase for home;  discussed pt. wanting to buy "prostretch" dorsiflexion rocker for home use      Manual Therapy   Manual Therapy  Joint mobilization;Passive ROM    Manual therapy comments  prone     Joint Mobilization  R ankle A/P mobs for improved ROM     Passive ROM  Manual stretch of PF, DF, EV, IV x 30 sec       Ankle Exercises: Stretches   Plantar Fascia Stretch  30 seconds;1 rep    Plantar Fascia Stretch Limitations  on prostretch     Soleus Stretch  30 seconds;1 rep    Gastroc Stretch  30 seconds;1 rep   on prostretch    Other Stretch  R ankle PF stretch x 30 sec    Other Stretch  Seated DF, PF stretch with R foot under table x 30 sec each way       Ankle Exercises: Seated   Heel Raises  Both;15 reps;3 seconds    Toe Raise  15 reps;3 seconds    Other Seated Ankle Exercises  R ankle 4-way with  yellow TB x 15      Ankle Exercises: Aerobic   Recumbent Bike  Lvl 1, 6 min              PT Education - 01/20/19 0944    Education Details  HEP update;  posterior tibialis    Person(s) Educated  Patient    Methods  Explanation;Demonstration;Verbal cues;Handout    Comprehension  Verbalized understanding;Returned demonstration;Verbal cues required       PT Short Term Goals - 01/20/19 0906      PT SHORT TERM GOAL #1   Title  Patient will be independent with initial HEP    Status  Achieved    Target Date  01/13/19        PT Long Term Goals - 01/20/19 0906      PT LONG TERM GOAL #1   Title  Patient will be independent with ongoing/advanced HEP    Status  On-going      PT LONG TERM GOAL #2   Title  Patient to improve R foot and ankle AROM to WNL without pain provocation    Status  On-going      PT LONG TERM GOAL #3   Title  Patient will improved R foot and ankle strength to >/= 4+/5 for improved stability and activity tolerance    Status  On-going      PT LONG TERM GOAL #4   Title  Patient to report ability to stand and ambulate to perform ADLs, household and work-related tasks without increased R foot or ankle pain    Status  On-going            Plan - 01/20/19 0943    Clinical Impression Statement  Pt. reporting she has been performing HEP 1x/day.  Able to demo good understanding and control with isolated ankle strengthening with yellow TB thus updated to red TB for home performance.  STG #1 met.  Requiring minor cueing for correct positioning with standing calf stretches.  Tolerated MT focused on improving ankle ROM and LE stretching without increased pain. Only complaint today was R ankle "stiffness" at times with standing calf stretching.  Updated HEP (see pt. education section).  Pt. verbalizing plans to purchase dorsiflexion Prostretch and instructed on online options  today.  Will monitor response to updated HEP and progress toward LTGs at upcoming session.     Comorbidities  Syringomyelia, migraines, anxiety, depression    Rehab Potential  Good    PT Treatment/Interventions  ADLs/Self Care Home Management;Cryotherapy;Electrical Stimulation;Iontophoresis 101m/ml Dexamethasone;Moist Heat;Ultrasound;Gait training;Stair training;Functional mobility training;Therapeutic activities;Therapeutic exercise;Balance training;Neuromuscular re-education;Patient/family education;Manual techniques;Passive range of motion;Dry needling;Taping;Vasopneumatic Device;Joint Manipulations    PT Next Visit Plan  R ankle ROM, esp midfoot mobility; R ankle and proximal LE strengthening; balance and propriocpetive training; manual therapy and modalities PRN    PT Home Exercise Plan  12/30/18 - gastroc/soleus, PF and plantar fasica stretches, 4-way ankle with red TB, standing posterior tib stretch    Consulted and Agree with Plan of Care  Patient       Patient will benefit from skilled therapeutic intervention in order to improve the following deficits and impairments:  Abnormal gait, Decreased activity tolerance, Decreased balance, Decreased coordination, Decreased endurance, Decreased mobility, Decreased range of motion, Decreased strength, Difficulty walking, Hypomobility, Increased muscle spasms, Impaired flexibility, Improper body mechanics, Pain  Visit Diagnosis: Pain in right ankle and joints of right foot  Stiffness of right foot, not elsewhere classified  Difficulty in walking, not elsewhere classified  Other abnormalities of gait and mobility  Muscle weakness (generalized)  Localized edema     Problem List Patient Active Problem List   Diagnosis Date Noted  . Closed nondisplaced fracture of navicular bone of right foot 11/04/2018  . IDA (iron deficiency anemia) 06/26/2017  . Iron deficiency anemia due to chronic blood loss 06/26/2017  . B12 deficiency 03/30/2017  . Asthma 12/08/2016  . Scalp lesion 01/01/2014  . Allergic rhinitis 11/11/2013  . Daytime  somnolence 09/04/2013  . [place]ringomy[place] 05/09/2013  . Mass 05/09/2013  . Paresthesia 07/19/2012  . Depression 07/19/2012  . Sensory disturbance 06/27/2012  . Leg weakness 06/27/2012  . Leukocytosis 06/27/2012  . Microcytic anemia 06/27/2012    MBess Harvest PTA 01/20/19 1:02 PM   [facility] [address] [place] [address]: [phone]  Fax:  [phone] Name: MANUSHKA HARTINGERMRN: [id number] of Birth: [birth year]

## 2019-01-27 ENCOUNTER — Encounter: Payer: Self-pay | Admitting: Physical Therapy

## 2019-01-27 ENCOUNTER — Ambulatory Visit: Payer: 59 | Attending: Family Medicine | Admitting: Physical Therapy

## 2019-01-27 ENCOUNTER — Other Ambulatory Visit: Payer: Self-pay

## 2019-01-27 DIAGNOSIS — M25571 Pain in right ankle and joints of right foot: Secondary | ICD-10-CM

## 2019-01-27 DIAGNOSIS — R600 Localized edema: Secondary | ICD-10-CM

## 2019-01-27 DIAGNOSIS — M6281 Muscle weakness (generalized): Secondary | ICD-10-CM | POA: Diagnosis not present

## 2019-01-27 DIAGNOSIS — M25674 Stiffness of right foot, not elsewhere classified: Secondary | ICD-10-CM | POA: Diagnosis not present

## 2019-01-27 DIAGNOSIS — R2689 Other abnormalities of gait and mobility: Secondary | ICD-10-CM

## 2019-01-27 DIAGNOSIS — R262 Difficulty in walking, not elsewhere classified: Secondary | ICD-10-CM | POA: Diagnosis not present

## 2019-01-27 NOTE — Patient Instructions (Signed)
    Home exercise program created by JoAnne Kreis, PT.  For questions, please contact JoAnne via phone at [phone] or email at [email or web address]  [facility] [place] [address]  [address] [place], , [address] Phone: [phone]   Fax:  [phone]    

## 2019-01-27 NOTE — Therapy (Addendum)
[facility] [address]  [place], [place], [address] Phone: [phone]   Fax:  [phone]  Physical Therapy Treatment / Discharge Summary  Patient Details  Name: [name] MRN: [id number] Date of Birth: 05-Dec-1975 Referring Provider (PT): Clearance Coots, MD   Encounter Date: 01/27/2019  PT End of Session - 01/27/19 1016    Visit Number  3    Number of Visits  8    Date for PT Re-Evaluation  02/24/19    Authorization Type  [facility]    PT Start Time  1016    PT Stop Time  1056    PT Time Calculation (min)  40 min    Activity Tolerance  Patient tolerated treatment well    Behavior During Therapy  [facility] for tasks assessed/performed       Past Medical History:  Diagnosis Date  . Anemia   . Anxiety   . Chicken pox   . Depression   . GERD (gastroesophageal reflux disease)    tums prn-with pregnancy only  . Headache(784.0)   . IBS (irritable bowel syndrome)   . Migraines   . Placenta previa 2012   with current pregnancy-type and cross 2 Units per protocol  . PONV (postoperative nausea and vomiting)   . Shortness of breath dyspnea    with this pregnancy   . Syringomyelia, acquired [facility])     Past Surgical History:  Procedure Laterality Date  . APPENDECTOMY  2005  . arthroscopic knee  1990  . CESAREAN SECTION  12/27/2010   Procedure: CESAREAN SECTION;  Surgeon: Shon Millet II;  Location: [facility];  Service: Gynecology;  Laterality: N/A;  . CESAREAN SECTION N/A 02/03/2015   Procedure: CESAREAN SECTION;  Surgeon: Everlene Farrier, MD;  Location: [facility];  Service: Obstetrics;  Laterality: N/A;  . left breast reduction  1996  . TUBAL LIGATION Bilateral 02/03/2015   Procedure: BILATERAL TUBAL LIGATION;  Surgeon: Everlene Farrier, MD;  Location: [facility];  Service: Obstetrics;  Laterality: Bilateral;  . WISDOM TOOTH EXTRACTION      There were no vitals filed for this visit.  Subjective Assessment - 01/27/19  1018    Subjective  Pt reports her foot has been feeling better - still stiff and slow to get moving in the morning but gets better as the day goes on.    Diagnostic tests  12/13/18 - R foot x-ray: A nondisplaced dorsal fracture of the right navicular is not optimally appreciated on lateral view, however there appears to be substantial evidence of interval healing with decreased lucency of the fracture line. No other fracture or dislocation of the right foot.    Patient Stated Goals  "to relieve some of the pain and strengthening the foot and ankle to allow increased comfort and normal gait pattern with walking"    Currently in Pain?  No/denies    Pain Onset  More than a month ago                       [facility] Adult PT Treatment/Exercise - 01/27/19 1016      Self-Care   Self-Care  Heat/Ice Application    Heat/Ice Application  Provided instruction in ice water bottle massage for plantar fascia.      Exercises   Exercises  Ankle      Knee/Hip Exercises: Standing   Hip Flexion  Right;Left;10 reps;Knee straight;Stengthening    Hip Flexion Limitations  red  TB; UE support on back of chair    Hip ADduction  Right;Left;10 reps;Strengthening    Hip ADduction Limitations  red TB; UE support on back of chair    Hip Abduction  Right;Left;10 reps;Knee straight;Stengthening    Abduction Limitations  red TB; UE support on back of chair    Hip Extension  Right;Left;10 reps;Knee straight;Stengthening    Extension Limitations  red TB; UE support on back of chair      Manual Therapy   Manual Therapy  Joint mobilization;Soft tissue mobilization;Passive ROM    Joint Mobilization  R cuboid mobs - pt noting tenderness with inferior pressure on cuboid    Soft tissue mobilization  cross friction massage to plantar fascia    Passive ROM  Manual stretch of PF, DF, EV, IV x 30 sec       Ankle Exercises: Aerobic   Recumbent Bike  L2 x 6 min      Ankle Exercises: Standing   Vector Stance  Right     Vector Stance Limitations  R SLS on blue foam oval + 4-way toe tap cones x 10; 1 pole A for balance    Heel Raises  Both;10 reps;3 seconds;Right    Heel Raises Limitations  negative heel on back of UBE; 2nd set with R only eccentric lowering      Ankle Exercises: Seated   Other Seated Ankle Exercises  Long sitting R toe curls with red TB x 10             PT Education - 01/27/19 1056    Education Details  HEP update - Heel raises with R eccentric lowering, 4-way hip with red TB, toe curls with red TB, plantar fascia ice water bottle massage    Person(s) Educated  Patient    Methods  Explanation;Demonstration;Handout    Comprehension  Verbalized understanding;Returned demonstration;Need further instruction       PT Short Term Goals - 01/27/19 1021      PT SHORT TERM GOAL #1   Title  Patient will be independent with initial HEP    Status  Achieved   01/20/19       PT Long Term Goals - 01/20/19 0906      PT LONG TERM GOAL #1   Title  Patient will be independent with ongoing/advanced HEP    Status  On-going      PT LONG TERM GOAL #2   Title  Patient to improve R foot and ankle AROM to WNL without pain provocation    Status  On-going      PT LONG TERM GOAL #3   Title  Patient will improved R foot and ankle strength to >/= 4+/5 for improved stability and activity tolerance    Status  On-going      PT LONG TERM GOAL #4   Title  Patient to report ability to stand and ambulate to perform ADLs, household and work-related tasks without increased R foot or ankle pain    Status  On-going            Plan - 01/27/19 1022    Clinical Impression Statement  [name] reporting overall improvement in R foot pain with no pain currently, although still notes more stiffness initially in the morning improving as the day go on. Good tolerance for exercise progression at foot level including introduction of proprioceptive training, although noting post-exercise muscle soreness in  hips following 4-way standing SLR. Some ttp present in R foot inferiorly at  cuboid as well as over proximal plantar fascia at calcaneal tuberosity which was addressed with STM and joint mobs. Patient also instructed in ice water bottle massage for plantar fascia. Patient continues to prefer 1x/wk frequency due to childcare issues, therefore provided instruction in HEP update/progression based on today's therapeutic exercises and activities.    Comorbidities  Syringomyelia, migraines, anxiety, depression    Rehab Potential  Good    PT Frequency  2x / week   1-2x/wk - pt wishing to try 1x/wk due to childcare issues   PT Duration  8 weeks    PT Treatment/Interventions  ADLs/Self Care Home Management;Cryotherapy;Electrical Stimulation;Iontophoresis 26m/ml Dexamethasone;Moist Heat;Ultrasound;Gait training;Stair training;Functional mobility training;Therapeutic activities;Therapeutic exercise;Balance training;Neuromuscular re-education;Patient/family education;Manual techniques;Passive range of motion;Dry needling;Taping;Vasopneumatic Device;Joint Manipulations    PT Next Visit Plan  R ankle ROM, esp midfoot mobility; R ankle and proximal LE strengthening; balance and propriocpetive training; manual therapy and modalities PRN    PT Home Exercise Plan  12/30/18 - gastroc/soleus, PF and plantar fasica stretches, 4-way ankle with red TB; 01/20/19 -  standing posterior tib stretch; 01/27/19 - Heel raises with R eccentric lowering, 4-way hip with red TB, toe curls with red TB, plantar fascia ice water bottle massage    Consulted and Agree with Plan of Care  Patient       Patient will benefit from skilled therapeutic intervention in order to improve the following deficits and impairments:  Abnormal gait, Decreased activity tolerance, Decreased balance, Decreased coordination, Decreased endurance, Decreased mobility, Decreased range of motion, Decreased strength, Difficulty walking, Hypomobility, Increased muscle  spasms, Impaired flexibility, Improper body mechanics, Pain  Visit Diagnosis: Pain in right ankle and joints of right foot  Stiffness of right foot, not elsewhere classified  Difficulty in walking, not elsewhere classified  Other abnormalities of gait and mobility  Muscle weakness (generalized)  Localized edema     Problem List Patient Active Problem List   Diagnosis Date Noted  . Closed nondisplaced fracture of navicular bone of right foot 11/04/2018  . IDA (iron deficiency anemia) 06/26/2017  . Iron deficiency anemia due to chronic blood loss 06/26/2017  . B12 deficiency 03/30/2017  . Asthma 12/08/2016  . Scalp lesion 01/01/2014  . Allergic rhinitis 11/11/2013  . Daytime somnolence 09/04/2013  . Syringomyelia ([place] 05/09/2013  . Mass 05/09/2013  . Paresthesia 07/19/2012  . Depression 07/19/2012  . Sensory disturbance 06/27/2012  . Leg weakness 06/27/2012  . Leukocytosis 06/27/2012  . Microcytic anemia 06/27/2012    JPercival Spanish PT, MPT 01/27/2019, 11:31 AM  [facility] [place] [place] [address]Phone: [phone]  Fax:  [phone] Name: Latoya RAMERMRN: [id number] of Birth: [birth year]  PHYSICAL THERAPY DISCHARGE SUMMARY  Visits from Start of Care: 3  Current functional level related to goals / functional outcomes:   Refer to above clinical impression for status as of last visit on 01/27/2019. Patient no showed and cancelled next 2 visits and has not returned to PT in >30 days, therefore will proceed with discharge from PT for this episode.   Remaining deficits:   As above. Unable to formally assess status at discharge due to failure to return to PT.   Education / Equipment:   HEP  Plan: Patient agrees to discharge.  Patient goals were partially met. Patient is being discharged due to not returning since the last visit.  ?????     JPercival Spanish PT,  MPT 03/12/19, 10:02 AM  [facility] [address]  [place], [place], [address] Phone: [phone]   Fax:  [phone]

## 2019-02-03 ENCOUNTER — Ambulatory Visit: Payer: 59

## 2019-02-04 DIAGNOSIS — H5213 Myopia, bilateral: Secondary | ICD-10-CM | POA: Diagnosis not present

## 2019-02-10 ENCOUNTER — Ambulatory Visit: Payer: 59 | Admitting: Physical Therapy

## 2019-02-19 ENCOUNTER — Telehealth: Payer: 59 | Admitting: Family

## 2019-02-19 ENCOUNTER — Encounter (INDEPENDENT_AMBULATORY_CARE_PROVIDER_SITE_OTHER): Payer: Self-pay

## 2019-02-19 ENCOUNTER — Ambulatory Visit: Payer: 59 | Attending: Internal Medicine

## 2019-02-19 ENCOUNTER — Telehealth: Payer: Self-pay

## 2019-02-19 DIAGNOSIS — Z8616 Personal history of COVID-19: Secondary | ICD-10-CM

## 2019-02-19 DIAGNOSIS — Z20822 Contact with and (suspected) exposure to covid-19: Secondary | ICD-10-CM

## 2019-02-19 DIAGNOSIS — Z20828 Contact with and (suspected) exposure to other viral communicable diseases: Secondary | ICD-10-CM | POA: Diagnosis not present

## 2019-02-19 HISTORY — DX: Personal history of COVID-19: Z86.16

## 2019-02-19 MED ORDER — BENZONATATE 100 MG PO CAPS: 100.0000 mg | ORAL_CAPSULE | Freq: Three times a day (TID) | ORAL | 0 refills | Status: DC | PRN

## 2019-02-19 NOTE — Telephone Encounter (Signed)
Patient advise on fever per protocol:   Temperature is the same and is noted to be less than 100.4: Continue to monitor at home.   Advise patient to stay hydrated, push oral fluids if able, and advise pt. to avoid using multiple blankets and layer of clothing to prevent overheating.   Avoid over use of anti-pyretic medications for fevers less than 101 degrees Fahrenheit.   Fever helps fight infection.    Worsening temperature, treat if temperature is > 101: treat with OTC anti-fever medications (Tylenol and/or Ibuprofen)   May alternate Tylenol and Ibuprofen every 3 hours as needed for fever greater than 101. Example: Tylenol at 9AM, Ibuprofen at 12PM, Tylenol at 3PM, Ibuprofen at 6PM, Tylenol at 9PM, Ibuprofen at 12AM, Tylenol at 3AM, Ibuprofen at 6AM. Then restart rotation with Tylenol at 9AM.   If fever remains for greater than 3 days, notify PCP.   If fever becomes greater than 103 and unable to reduce with over the counter medication, contact PCP.   Patient has taken Ibuprofen and fever has come down to 100. Patient went and got tasted today and will continue to monitor

## 2019-02-19 NOTE — Progress Notes (Signed)
E-Visit for Corona Virus Screening   Your current symptoms could be consistent with the coronavirus.  Many health care providers can now test patients at their office but not all are.   has multiple testing sites. For information on our [place] testing locations and hours go to [email or web address]  We are enrolling you in our [place] for [place] . Daily you will receive a questionnaire within the [place] website. Our COVID 19 response team will be monitoring your responses daily.  Testing Information: The COVID-19 Community Testing sites will begin testing BY APPOINTMENT ONLY.  You can schedule online at [email or web address]  If you do not have access to a smart phone or computer you may call [phone] for an appointment.  Testing Locations: Appointment schedule is 8 am to 3:30 pm at all sites  [facility] indoors at [address] [place] [address] [facility]  indoors at [place]. [address], [place], [place] [address] [place] indoors at [address], [place] [address]  Additional testing sites in the Community:  . For [facility] Testing sites in [facility]  FaceUpdate.uy  . For Pop-up testing sites in [place]  BowlDirectory.co.uk  . For Testing sites with regular hours [email or web address]  . For [place] [email or web address]  . For [facility] [email or web address]  . For [facility] testing in [place] and [employer] [email or web address]  . For [facility] testing in [facility]   [email or web address].[email or web address]  For  more  information about community testing call [phone]   We are enrolling you in our [place] for [place] . Daily you will receive a questionnaire within the [place] website. Our COVID 19 response team will be monitoring your responses daily.  Please quarantine yourself while awaiting your test results. If you develop fever/cough/breathlessness, please stay home for 10 days with improving symptoms and until you have had 24 hours of no fever (without taking a fever reducer).  You should wear a mask or cloth face covering over your nose and mouth if you must be around other people or animals, including pets (even at home). Try to stay at least 6 feet away from other people. This will protect the people around you.  Please continue good preventive care measures, including:  frequent hand-washing, avoid touching your face, cover coughs/sneezes, stay out of crowds and keep a 6 foot distance from others.  COVID-19 is a respiratory illness with symptoms that are similar to the flu. Symptoms are typically mild to moderate, but there have been cases of severe illness and death due to the virus.   The following symptoms may appear 2-14 days after exposure: . Fever . Cough . Shortness of breath or difficulty breathing . Chills . Repeated shaking with chills . Muscle pain . Headache . Sore throat . New loss of taste or smell . Fatigue . Congestion or runny nose . Nausea or vomiting . Diarrhea  Go to the nearest hospital ED for assessment if fever/cough/breathlessness are severe or illness seems like a threat to life.  It is vitally important that if you feel that you have an infection such as this virus or any other virus that you stay home and away from places where you may spread it to others.  You should avoid contact with people age 58 and older.   You can use medication such as A prescription cough medication called Tessalon Perles 100 mg. You may take 1-2 capsules every 8 hours as  needed for cough.  You may also take acetaminophen (Tylenol) as needed for fever.  Reduce your risk of any infection by using the same precautions used for avoiding the common cold or flu:  Marland Kitchen Wash your hands often with soap and warm water for at least 20 seconds.  If soap and water are not readily available, use an alcohol-based hand sanitizer with at least 60% alcohol.  . If coughing or sneezing, cover your mouth and nose by coughing or sneezing into the elbow areas of your shirt or coat, into a tissue or into your sleeve (not your hands). . Avoid shaking hands with others and consider head nods or verbal greetings only. . Avoid touching your eyes, nose, or mouth with unwashed hands.  . Avoid close contact with people who are sick. . Avoid places or events with large numbers of people in one location, like concerts or sporting events. . Carefully consider travel plans you have or are making. . If you are planning any travel outside or inside the [place], visit the CDC's Travelers' Health webpage for the latest health notices. . If you have some symptoms but not all symptoms, continue to monitor at home and seek medical attention if your symptoms worsen. . If you are having a medical emergency, call 911.  HOME CARE . Only take medications as instructed by your medical team. . Drink plenty of fluids and get plenty of rest. . A steam or ultrasonic humidifier can help if you have congestion.   GET HELP RIGHT AWAY IF YOU HAVE EMERGENCY WARNING SIGNS** FOR COVID-19. If you or someone is showing any of these signs seek emergency medical care immediately. Call 911 or proceed to your closest emergency facility if: . You develop worsening high fever. . Trouble breathing . Bluish lips or face . Persistent pain or pressure in the chest . New confusion . Inability to wake or stay awake . You cough up blood. . Your symptoms become more severe  **This list is not all possible symptoms. Contact your  medical provider for any symptoms that are sever or concerning to you.  MAKE SURE YOU   Understand these instructions.  Will watch your condition.  Will get help right away if you are not doing well or get worse.  Your e-visit answers were reviewed by a board certified advanced clinical practitioner to complete your personal care plan.  Depending on the condition, your plan could have included both over the counter or prescription medications.  If there is a problem please reply once you have received a response from your provider.  Your safety is important to [place].  If you have drug allergies check your prescription carefully.    You can use MyChart to ask questions about today's visit, request a non-urgent call back, or ask for a work or school excuse for 24 hours related to this e-Visit. If it has been greater than 24 hours you will need to follow up with your provider, or enter a new e-Visit to address those concerns. You will get an e-mail in the next two days asking about your experience.  I hope that your e-visit has been valuable and will speed your recovery. Thank you for using e-visits.   Approximately 5 minutes was spent documenting and reviewing patient's chart.

## 2019-02-20 ENCOUNTER — Encounter (INDEPENDENT_AMBULATORY_CARE_PROVIDER_SITE_OTHER): Payer: Self-pay

## 2019-02-20 ENCOUNTER — Telehealth: Payer: Self-pay

## 2019-02-20 LAB — NOVEL CORONAVIRUS, NAA: SARS-CoV-2, NAA: DETECTED — AB

## 2019-02-20 NOTE — Telephone Encounter (Signed)
Patient advise on diarrhea and change in appetite per protocol:   If appetite becomes worse: encourage patient to drink fluids as tolerated, work their way up to bland solid food such as crackers, pretzels, soup, bread or applesauce and boiled starches.   If patient is unable to tolerate any foods or liquids, notify PCP.   IF PATIENT DEVELOPS SEVERE VOMITING (MORE THAN 6 TIMES A DAY AND OR >8 HOURS) AND/OR SEVERE ABDOMINAL PAIN ADVISE PATIENT TO CALL 911 AND SEEK TREATMENT IN ED   If diarrhea remains the same: encourage patient to drink oral fluids and bland foods.   Avoid alcohol, spicy foods, caffeine or fatty foods that could make diarrhea worse.   Continue to monitor for signs of dehydration (increased thirst decreased urine output, yellow urine, dry skin, headache or dizziness).   Advise patient to try OTC medication (Imodium, kaopectate, Pepto-Bismol) as per manufacturer's instructions.   If worsening diarrhea occurs and becomes severe (6-7 bowel movements a day): notify PCP   If diarrhea last greater than 7 days: notify PCP   IF SIGNS OF DEHYDRATION OCCUR (INCREASED THIRST, DECREASED URINE OUTPUT, YELLOW URINE, DRY SKIN, HEADACHE OR DIZZINESS) ADVISE PATIENT TO CALL [place] TREATMENT IN THE ED  Patient states that she had diarrhea several times yesterday and that she just don't feel like eating but will try to eat something

## 2019-02-21 ENCOUNTER — Encounter: Payer: Self-pay | Admitting: Infectious Diseases

## 2019-03-15 NOTE — Progress Notes (Signed)
[place] at [facility] [address] [place], [place] [address] [phone]  Date:  03/17/2019   Name:  JAIRY Hill   DOB:  1975-03-13   MRN:  [id number]  PCP:  Darreld Mclean, MD    Chief Complaint: No chief complaint on file.   History of Present Illness:  [name] is a 44 y.o. very pleasant female patient who presents with the following:  Generally healthy woman here today for a follow-up visit for COVID-19 infection, diagnosed on December 31 Virtual visit today Patient location is home, provider location is office Patient identity confirmed with 2 factors, she gives consent for virtual visit today The patient and myself are present on the call today  She notes that she is much better from her covid infection Her sx were mostly GI- poor appetite, low energy She had a mild cough with her infection.  However over the last week she has felt really tired- she may feel SOB, has noticed this for at least a week No history of DVT or PE She is a non smoker  She was working on getting in better shape prior to getting ill so she expected better cardiorespiratory function at this time No leg swelling or pain in her calves  No fever noted   Her stomach is better, she is eating again She lost 10 lbs in one week- she gained back about 5 pounds since she was sick  Her husband was also ill- his sx were more respiratory Her mother who is also my patient was also ill with Covid  She is working from home- she is doing pretty well keeping up with her workload, she is strong enough She is checking her home oxygen sats- high 90s  She is not checking her BP at home   S/p BTL LMP is current   Patient Active Problem List   Diagnosis Date Noted  . Closed nondisplaced fracture of navicular bone of right foot 11/04/2018  . IDA (iron deficiency anemia) 06/26/2017  . Iron deficiency anemia due to chronic blood loss  06/26/2017  . B12 deficiency 03/30/2017  . Asthma 12/08/2016  . Scalp lesion 01/01/2014  . Allergic rhinitis 11/11/2013  . Daytime somnolence 09/04/2013  . Syringomyelia ([place]) 05/09/2013  . Mass 05/09/2013  . Paresthesia 07/19/2012  . Depression 07/19/2012  . Sensory disturbance 06/27/2012  . Leg weakness 06/27/2012  . Leukocytosis 06/27/2012  . Microcytic anemia 06/27/2012    Past Medical History:  Diagnosis Date  . Anemia   . Anxiety   . Chicken pox   . Depression   . GERD (gastroesophageal reflux disease)    tums prn-with pregnancy only  . Headache(784.0)   . IBS (irritable bowel syndrome)   . Migraines   . Placenta previa 2012   with current pregnancy-type and cross 2 Units per protocol  . PONV (postoperative nausea and vomiting)   . Shortness of breath dyspnea    with this pregnancy   . Syringomyelia, acquired [facility])     Past Surgical History:  Procedure Laterality Date  . APPENDECTOMY  2005  . arthroscopic knee  1990  . CESAREAN SECTION  12/27/2010   Procedure: CESAREAN SECTION;  Surgeon: Shon Millet II;  Location: [facility];  Service: Gynecology;  Laterality: N/A;  . CESAREAN SECTION N/A 02/03/2015   Procedure: CESAREAN SECTION;  Surgeon: Everlene Farrier, MD;  Location: [facility];  Service: Obstetrics;  Laterality: N/A;  .  left breast reduction  1996  . TUBAL LIGATION Bilateral 02/03/2015   Procedure: BILATERAL TUBAL LIGATION;  Surgeon: Everlene Farrier, MD;  Location: [facility];  Service: Obstetrics;  Laterality: Bilateral;  . WISDOM TOOTH EXTRACTION      Social History   Tobacco Use  . Smoking status: Never Smoker  . Smokeless tobacco: Never Used  Substance Use Topics  . Alcohol use: No    Comment: rarely, but not while preg  . Drug use: No    Family History  Problem Relation Age of Onset  . High blood pressure Mother   . Arthritis Mother   . Hypertension Mother   . Other Mother        "Multisystem atrophy"  . Clotting disorder Mother   . Irritable  bowel syndrome Mother   . Alcohol abuse Father   . Arthritis Father   . Colon polyps Father   . High blood pressure Brother   . Prostate cancer Maternal Grandfather   . Colon cancer Maternal Uncle   . Stomach cancer Neg Hx     Allergies  Allergen Reactions  . Topamax [Topiramate] Other (See Comments)    Pt states that she has trouble recalling words when she takes this medication.    Medication list has been reviewed and updated.  Current Outpatient Medications on File Prior to Visit  Medication Sig Dispense Refill  . ALPRAZolam (XANAX) 0.25 MG tablet Take 1 tablet (0.25 mg total) by mouth 2 (two) times daily as needed for anxiety. 40 tablet 0  . Apremilast (OTEZLA) 30 MG TABS Take 30 mg by mouth 2 (two) times daily.     . beclomethasone (QVAR REDIHALER) 80 MCG/ACT inhaler Inhale 1 puff into the lungs 2 (two) times daily. 1 Inhaler 3  . benzonatate (TESSALON PERLES) 100 MG capsule Take 1 capsule (100 mg total) by mouth 3 (three) times daily as needed. 20 capsule 0  . budesonide-formoterol (SYMBICORT) 80-4.5 MCG/ACT inhaler Inhale 2 puffs into the lungs 2 (two) times daily. (Patient not taking: Reported on 12/30/2018) 1 Inhaler 3  . cephALEXin (KEFLEX) 500 MG capsule Take 1 capsule (500 mg total) by mouth 2 (two) times daily. (Patient not taking: Reported on 12/30/2018) 14 capsule 0  . ciclesonide (ALVESCO) 80 MCG/ACT inhaler Inhale 1 puff into the lungs 2 (two) times daily. (Patient not taking: Reported on 12/30/2018) 1 Inhaler 11  . cyanocobalamin (,VITAMIN B-12,) 1000 MCG/ML injection Inject 1 mL (1,000 mcg total) into the muscle once a week. 10 mL 2  . ergocalciferol (VITAMIN D2) 50000 units capsule Take 1 capsule (50,000 Units total) by mouth once a week. 8 capsule 6  . HYDROcodone-acetaminophen (NORCO/VICODIN) 5-325 MG tablet Take 1 tablet by mouth every 6 (six) hours as needed for moderate pain or severe pain. (Patient not taking: Reported on 12/30/2018) 10 tablet 0  . ibuprofen  (ADVIL,MOTRIN) 600 MG tablet Take 1 tablet (600 mg total) by mouth every 6 (six) hours as needed. 30 tablet 1  . loratadine (CLARITIN) 10 MG tablet Take 1 tablet (10 mg total) by mouth daily. (Patient not taking: Reported on 12/30/2018) 30 tablet 11  . meloxicam (MOBIC) 7.5 MG tablet Take 1 tablet (7.5 mg total) by mouth daily. Use as needed for foot pain (Patient not taking: Reported on 12/30/2018) 30 tablet 0  . montelukast (SINGULAIR) 10 MG tablet Take 1 tablet (10 mg total) by mouth at bedtime. 30 tablet 6  . Multiple Vitamin (MULTIVITAMIN) tablet Take 1 tablet by mouth daily.    Marland Kitchen  mupirocin ointment (BACTROBAN) 2 % Place 1 application into the nose 2 (two) times daily. (Patient not taking: Reported on 12/30/2018) 22 g 1  . neomycin-polymyxin-hydrocortisone (CORTISPORIN) OTIC solution Place 4 drops into the right ear 4 (four) times daily. (Patient not taking: Reported on 12/30/2018) 10 mL 0  . venlafaxine XR (EFFEXOR-XR) 150 MG 24 hr capsule Take 1 capsule (150 mg total) by mouth daily with breakfast. (Patient not taking: Reported on 12/30/2018) 90 capsule 3   No current facility-administered medications on file prior to visit.    Review of Systems:  As per HPI- otherwise negative.   Physical Examination: There were no vitals filed for this visit. There were no vitals filed for this visit. There is no height or weight on file to calculate BMI. Ideal Body Weight:    Patient observed over video monitor.  She looks well, no cough, wheezing, or shortness of breath is noted   Assessment and Plan: SOB (shortness of breath) - Plan: D-Dimer, Quantitative  COVID-19 virus detected - Plan: D-Dimer, Quantitative  Here today for a virtual visit to follow-up for recent COVID-19 infection.  Sakiyah's symptoms were mostly GI during her acute infection, she now notes more difficulty with shortness of breath.  We discussed the possibility of a PE, COVID-19 association with hypercoagulability.  Will obtain  a D-dimer today.  Assuming this is negative, plan to see her on Thursday and will do chest x-ray and other evaluation as needed.  If D-dimer is positive will plan for CT angiogram She states understanding and agreement  Moderate medical decision making  Signed Lamar Blinks, MD

## 2019-03-17 ENCOUNTER — Other Ambulatory Visit (INDEPENDENT_AMBULATORY_CARE_PROVIDER_SITE_OTHER): Payer: 59

## 2019-03-17 ENCOUNTER — Ambulatory Visit (INDEPENDENT_AMBULATORY_CARE_PROVIDER_SITE_OTHER): Payer: 59 | Admitting: Family Medicine

## 2019-03-17 ENCOUNTER — Other Ambulatory Visit: Payer: Self-pay

## 2019-03-17 ENCOUNTER — Encounter: Payer: Self-pay | Admitting: Family Medicine

## 2019-03-17 DIAGNOSIS — U071 COVID-19: Secondary | ICD-10-CM | POA: Diagnosis not present

## 2019-03-17 DIAGNOSIS — R0602 Shortness of breath: Secondary | ICD-10-CM

## 2019-03-17 DIAGNOSIS — D72829 Elevated white blood cell count, unspecified: Secondary | ICD-10-CM

## 2019-03-18 ENCOUNTER — Encounter: Payer: Self-pay | Admitting: Family Medicine

## 2019-03-18 LAB — CBC
HCT: 40.5 % (ref 36.0–46.0)
Hemoglobin: 13.3 g/dL (ref 12.0–15.0)
MCHC: 32.7 g/dL (ref 30.0–36.0)
MCV: 85.3 fl (ref 78.0–100.0)
Platelets: 377 10*3/uL (ref 150.0–400.0)
RBC: 4.75 Mil/uL (ref 3.87–5.11)
RDW: 14.2 % (ref 11.5–15.5)
WBC: 9.7 10*3/uL (ref 4.0–10.5)

## 2019-03-18 LAB — D-DIMER, QUANTITATIVE: D-Dimer, Quant: 0.41 mcg/mL FEU (ref <0.50)

## 2019-03-18 IMAGING — DX DG CHEST 2V
2 series · 2 of 2 positions shown · non-contrast
Comparison: This 11/17/2016

CLINICAL DATA: Cough with right-sided chest and back pain for 2
months

EXAM:
CHEST - 2 VIEW

[chest pa]
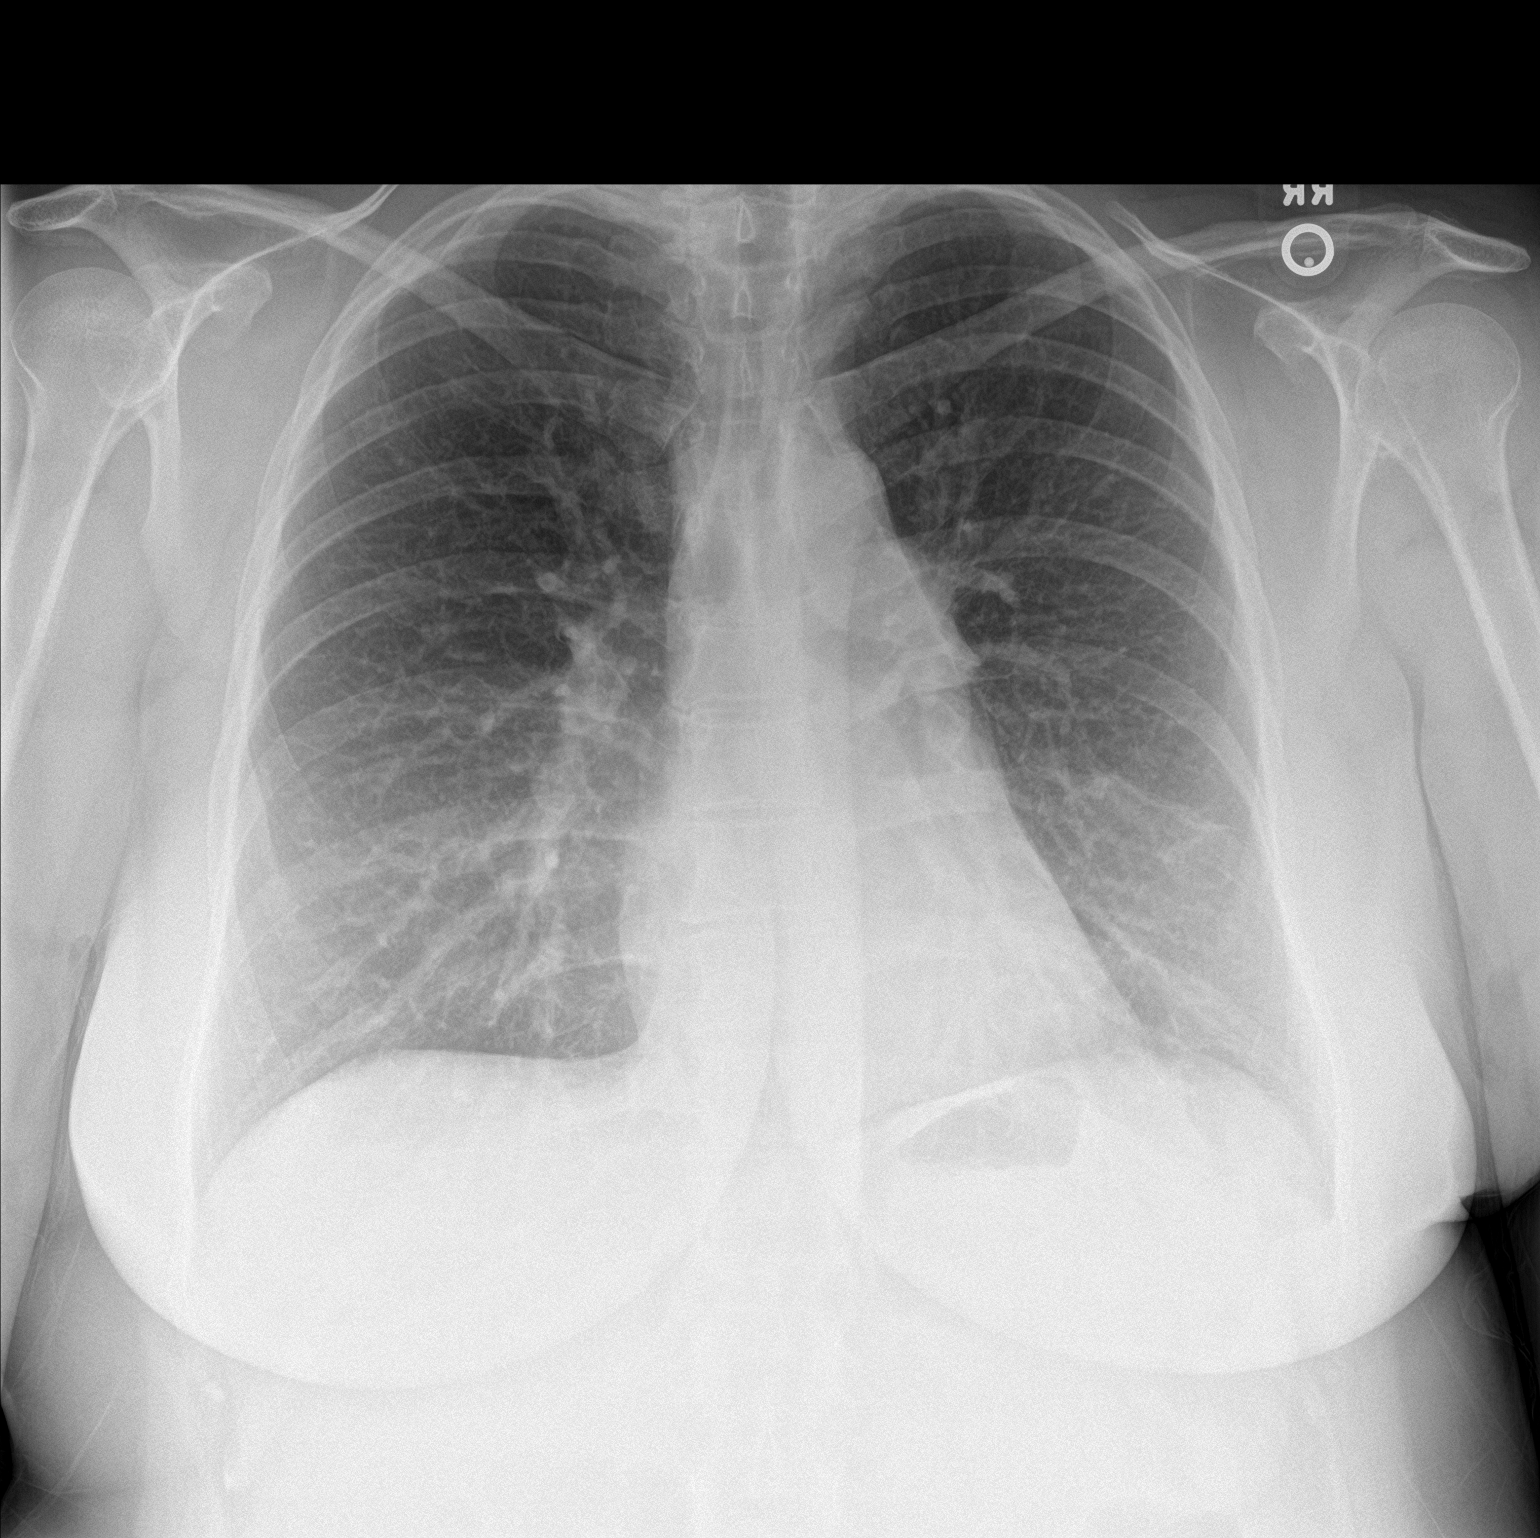

[chest lat]
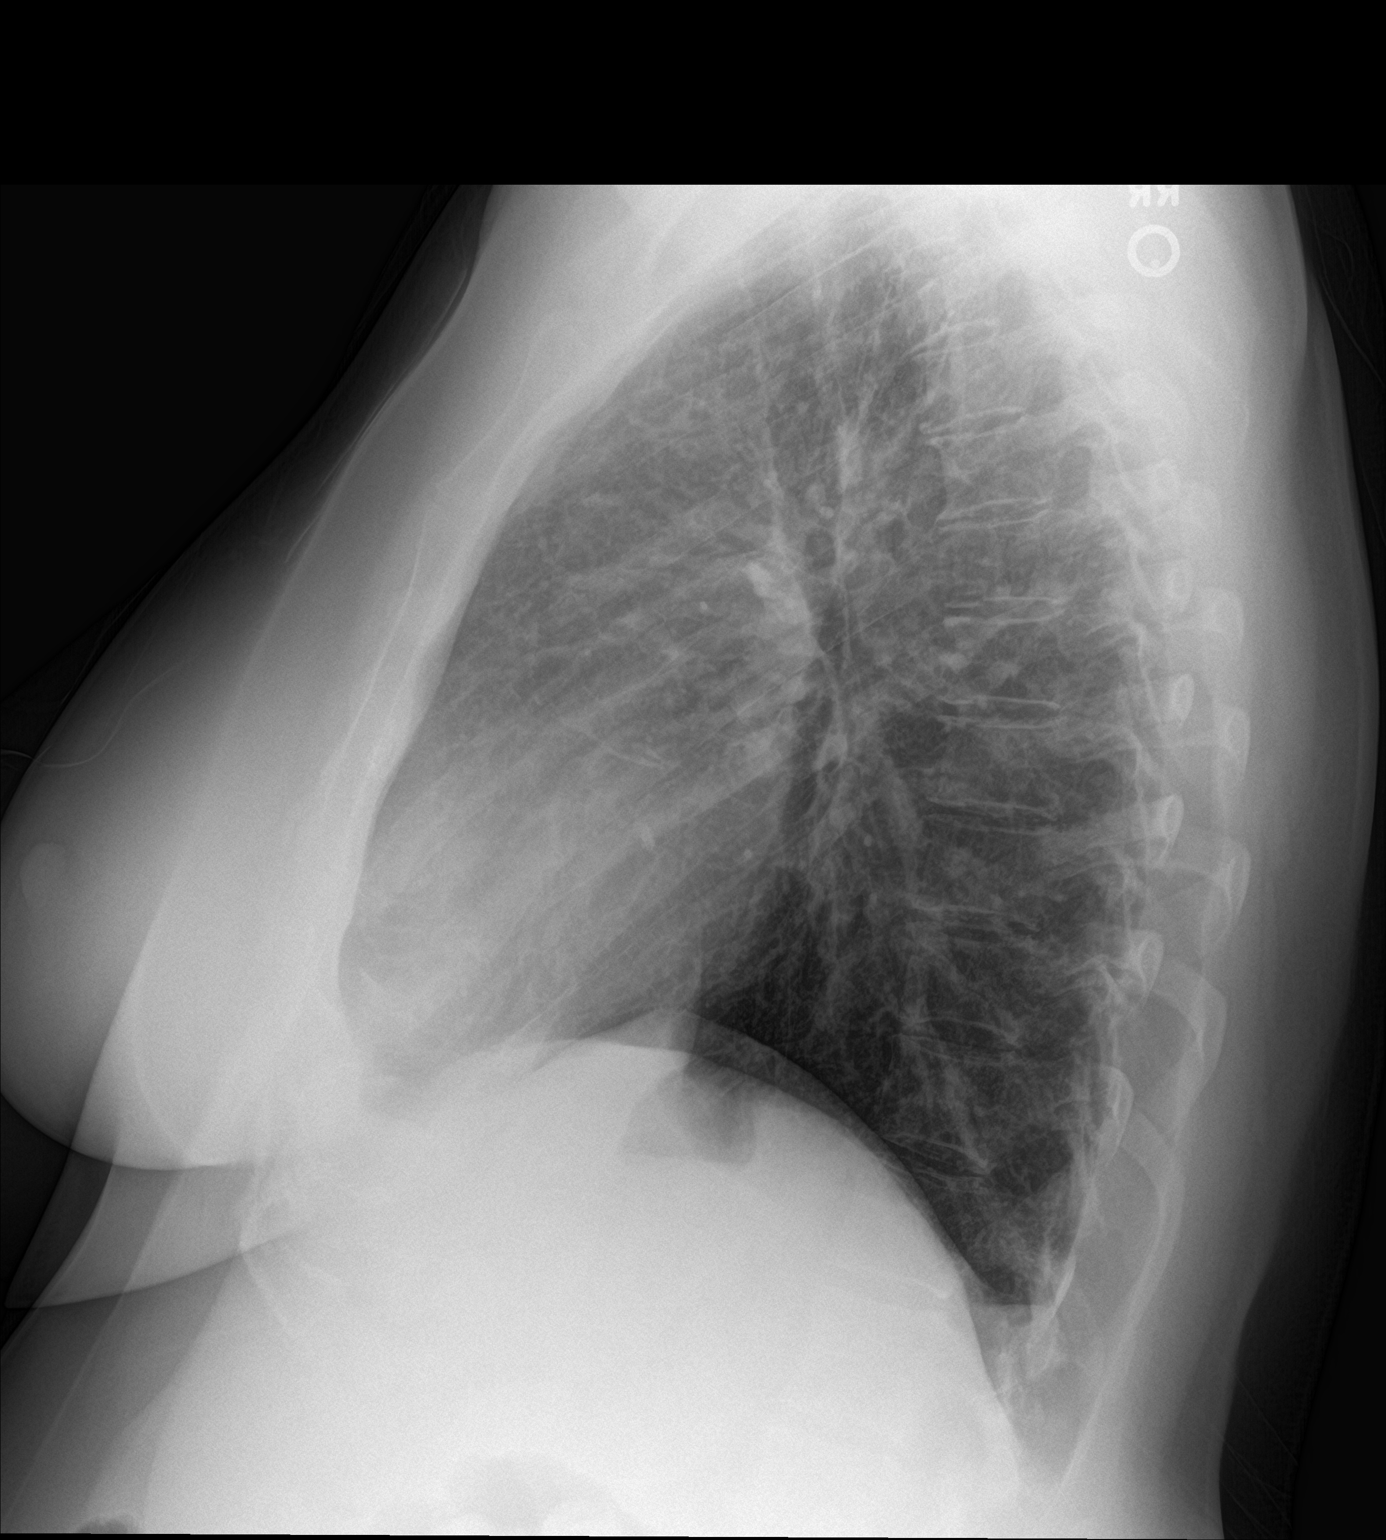

[2 of 2 positions shown; findings below may reference images not displayed]

FINDINGS: Minimal scarring or atelectasis on the left. There is no edema,
consolidation, effusion, or pneumothorax. Normal heart size and
mediastinal contours.
IMPRESSION: 1. Minimal scarring or atelectasis on the left.
2. No acute finding.

## 2019-03-18 NOTE — Progress Notes (Signed)
[place] at [facility] [place], [place], [place] [address] [phone]  Date:  03/20/2019   Name:  [name]   DOB:  1975/07/25   MRN:  [id number]  PCP:  Darreld Mclean, MD    Chief Complaint: Shortness of Breath   History of Present Illness:  [name] is a 44 y.o. very pleasant female patient who presents with the following:  Here today for an in person follow-up visit We had a phone visit earlier this week to discuss recent COVID-35 infection She tested positive for COVID-19 on December 31, her symptoms were mostly GI related However more recently she had no symptoms of shortness of breath; we got a D-dimer on 1/25 which was thankfully negative, pulmonary embolism unlikely  She is status post BTL- no chance of pregnancy She continues to feel run down, easily short of breath She may occasionally note right sided CP- non exertional  This is actually been present for 5 or 6 weeks, last noted yesterday She notes her father had cardiomyopathy, this concerned her somewhat Fevers are resolved  She is working from home right now, continues to have low energy from her illness  Labs on 1/25  Lab on 03/17/2019  Component Date Value Ref Range Status  . D-Dimer, Quant 03/17/2019 0.41  <0.50 mcg/mL FEU Final   Comment: . The D-Dimer test is used frequently to exclude an acute PE or DVT. In patients with a low to moderate clinical risk assessment and a D-Dimer result <0.50 mcg/mL FEU, the likelihood of a PE or DVT is very low. However, a thromboembolic event should not be excluded solely on the basis of the D-Dimer level. Increased levels of D-Dimer are associated with a PE, DVT, DIC, malignancies, inflammation, sepsis, surgery, trauma, pregnancy, and advancing patient age. [Jama 2006 11:295(2):199-207] . For additional information, please refer to: http://education.questdiagnostics.com/faq/FAQ149 (This  link is being provided for informational/ educational purposes only) .   Marland Kitchen WBC 03/17/2019 9.7  4.0 - 10.5 K/uL Final  . RBC 03/17/2019 4.75  3.87 - 5.11 Mil/uL Final  . Platelets 03/17/2019 377.0  150.0 - 400.0 K/uL Final  . Hemoglobin 03/17/2019 13.3  12.0 - 15.0 g/dL Final  . HCT 03/17/2019 40.5  36.0 - 46.0 % Final  . MCV 03/17/2019 85.3  78.0 - 100.0 fl Final  . MCHC 03/17/2019 32.7  30.0 - 36.0 g/dL Final  . RDW 03/17/2019 14.2  11.5 - 15.5 % Final     Patient Active Problem List   Diagnosis Date Noted  . Closed nondisplaced fracture of navicular bone of right foot 11/04/2018  . IDA (iron deficiency anemia) 06/26/2017  . Iron deficiency anemia due to chronic blood loss 06/26/2017  . B12 deficiency 03/30/2017  . Asthma 12/08/2016  . Scalp lesion 01/01/2014  . Allergic rhinitis 11/11/2013  . Daytime somnolence 09/04/2013  . Syringomyelia ([place]) 05/09/2013  . Mass 05/09/2013  . Paresthesia 07/19/2012  . Depression 07/19/2012  . Sensory disturbance 06/27/2012  . Leg weakness 06/27/2012  . Leukocytosis 06/27/2012  . Microcytic anemia 06/27/2012    Past Medical History:  Diagnosis Date  . Anemia   . Anxiety   . Chicken pox   . Depression   . GERD (gastroesophageal reflux disease)    tums prn-with pregnancy only  . Headache(784.0)   . IBS (irritable bowel syndrome)   . Migraines   . Placenta previa 2012   with current pregnancy-type and  cross 2 Units per protocol  . PONV (postoperative nausea and vomiting)   . Shortness of breath dyspnea    with this pregnancy   . Syringomyelia, acquired [facility])     Past Surgical History:  Procedure Laterality Date  . APPENDECTOMY  2005  . arthroscopic knee  1990  . CESAREAN SECTION  12/27/2010   Procedure: CESAREAN SECTION;  Surgeon: Shon Millet II;  Location: [facility];  Service: Gynecology;  Laterality: N/A;  . CESAREAN SECTION N/A 02/03/2015   Procedure: CESAREAN SECTION;  Surgeon: Everlene Farrier, MD;  Location: [facility];   Service: Obstetrics;  Laterality: N/A;  . left breast reduction  1996  . TUBAL LIGATION Bilateral 02/03/2015   Procedure: BILATERAL TUBAL LIGATION;  Surgeon: Everlene Farrier, MD;  Location: [facility];  Service: Obstetrics;  Laterality: Bilateral;  . WISDOM TOOTH EXTRACTION      Social History   Tobacco Use  . Smoking status: Never Smoker  . Smokeless tobacco: Never Used  Substance Use Topics  . Alcohol use: No    Comment: rarely, but not while preg  . Drug use: No    Family History  Problem Relation Age of Onset  . High blood pressure Mother   . Arthritis Mother   . Hypertension Mother   . Other Mother        "Multisystem atrophy"  . Clotting disorder Mother   . Irritable bowel syndrome Mother   . Alcohol abuse Father   . Arthritis Father   . Colon polyps Father   . High blood pressure Brother   . Prostate cancer Maternal Grandfather   . Colon cancer Maternal Uncle   . Stomach cancer Neg Hx     Allergies  Allergen Reactions  . Topamax [Topiramate] Other (See Comments)    Pt states that she has trouble recalling words when she takes this medication.    Medication list has been reviewed and updated.  Current Outpatient Medications on File Prior to Visit  Medication Sig Dispense Refill  . ALPRAZolam (XANAX) 0.25 MG tablet Take 1 tablet (0.25 mg total) by mouth 2 (two) times daily as needed for anxiety. 40 tablet 0  . Apremilast (OTEZLA) 30 MG TABS Take 30 mg by mouth 2 (two) times daily.     . beclomethasone (QVAR REDIHALER) 80 MCG/ACT inhaler Inhale 1 puff into the lungs 2 (two) times daily. 1 Inhaler 3  . budesonide-formoterol (SYMBICORT) 80-4.5 MCG/ACT inhaler Inhale 2 puffs into the lungs 2 (two) times daily. (Patient not taking: Reported on 12/30/2018) 1 Inhaler 3  . cephALEXin (KEFLEX) 500 MG capsule Take 1 capsule (500 mg total) by mouth 2 (two) times daily. (Patient not taking: Reported on 12/30/2018) 14 capsule 0  . ciclesonide (ALVESCO) 80 MCG/ACT inhaler Inhale 1  puff into the lungs 2 (two) times daily. (Patient not taking: Reported on 12/30/2018) 1 Inhaler 11  . cyanocobalamin (,VITAMIN B-12,) 1000 MCG/ML injection Inject 1 mL (1,000 mcg total) into the muscle once a week. 10 mL 2  . ergocalciferol (VITAMIN D2) 50000 units capsule Take 1 capsule (50,000 Units total) by mouth once a week. 8 capsule 6  . HYDROcodone-acetaminophen (NORCO/VICODIN) 5-325 MG tablet Take 1 tablet by mouth every 6 (six) hours as needed for moderate pain or severe pain. (Patient not taking: Reported on 12/30/2018) 10 tablet 0  . ibuprofen (ADVIL,MOTRIN) 600 MG tablet Take 1 tablet (600 mg total) by mouth every 6 (six) hours as needed. 30 tablet 1  . loratadine (CLARITIN) 10 MG  tablet Take 1 tablet (10 mg total) by mouth daily. (Patient not taking: Reported on 12/30/2018) 30 tablet 11  . meloxicam (MOBIC) 7.5 MG tablet Take 1 tablet (7.5 mg total) by mouth daily. Use as needed for foot pain (Patient not taking: Reported on 12/30/2018) 30 tablet 0  . montelukast (SINGULAIR) 10 MG tablet Take 1 tablet (10 mg total) by mouth at bedtime. 30 tablet 6  . Multiple Vitamin (MULTIVITAMIN) tablet Take 1 tablet by mouth daily.    . mupirocin ointment (BACTROBAN) 2 % Place 1 application into the nose 2 (two) times daily. (Patient not taking: Reported on 12/30/2018) 22 g 1  . neomycin-polymyxin-hydrocortisone (CORTISPORIN) OTIC solution Place 4 drops into the right ear 4 (four) times daily. (Patient not taking: Reported on 12/30/2018) 10 mL 0  . venlafaxine XR (EFFEXOR-XR) 150 MG 24 hr capsule Take 1 capsule (150 mg total) by mouth daily with breakfast. (Patient not taking: Reported on 12/30/2018) 90 capsule 3   No current facility-administered medications on file prior to visit.    Review of Systems:  As per HPI- otherwise negative.  No current fever or chills Physical Examination: Vitals:   03/20/19 0913  BP: 110/70  Pulse: 92  Resp: 16  Temp: (!) 96 F (35.6 C)  SpO2: 97%   Vitals:    03/20/19 0913  Weight: 197 lb (89.4 kg)  Height: 5\' 8"  (1.727 m)   Body mass index is 29.95 kg/m. Ideal Body Weight: Weight in (lb) to have BMI = 25: 164.1  GEN: WDWN, NAD, Non-toxic, A & O x 3, overweight, looks well-she has lost some weight from her recent illness HEENT: Atraumatic, Normocephalic. Neck supple. No masses, No LAD. Ears and Nose: No external deformity. CV: RRR, No M/G/R. No JVD. No thrill. No extra heart sounds. I am able to reproduce her chest discomfort by pressing on the right side of her chest wall PULM: CTA B, no wheezes, crackles, rhonchi. No retractions. No resp. distress. No accessory muscle use. ABD: S, NT, ND, +BS. No rebound. No HSM. EXTR: No c/c/e NEURO Normal gait.  PSYCH: Normally interactive. Conversant. Not depressed or anxious appearing.  Calm demeanor.    Wt Readings from Last 3 Encounters:  03/20/19 197 lb (89.4 kg)  12/13/18 210 lb (95.3 kg)  03/27/18 226 lb (102.5 kg)    EKG: SR, low voltage.  No ST elevation or depression, no arrhythmia C/w tracing from 01/2017- no significant change noted  Assessment and Plan: COVID-19 virus infection - Plan: DG Chest 2 View  SOB (shortness of breath) - Plan: DG Chest 2 View, EKG 12-Lead  Abnormal EKG - Plan: ECHOCARDIOGRAM COMPLETE  In person follow-up visit today for sequelae of COVID-19 infection [name] was diagnosed with COVID-19 4 weeks ago, she has noted some shortness of breath and continues to be fatigued We got a D-dimer recently which was negative, PE very unlikely EKG today is not normal, but not changed from previous.  I will set her up for echocardiogram, will obtain chest x-ray today She has had some chest discomfort which is reproducible on exam, present for more than 4 weeks.  Do not think CAD likely I have asked her to keep me closely posted about her symptoms and concerns, she will let me know if not gradually improving Moderate medical decision making today This visit occurred during  the SARS-CoV-2 public health emergency.  Safety protocols were in place, including screening questions prior to the visit, additional usage of staff PPE, and extensive  cleaning of exam room while observing appropriate contact time as indicated for disinfecting solutions.    Signed Lamar Blinks, MD  Received her chest film as follows:  DG Chest 2 View  Result Date: 03/20/2019 CLINICAL DATA:  Still with some shortness of breath. COVID-19 infection in December 2020. EXAM: CHEST - 2 VIEW COMPARISON:  03/07/2018. FINDINGS: Normal heart, mediastinum and hila. Lungs are clear.  No pleural effusion or pneumothorax. Skeletal structures are unremarkable. IMPRESSION: No active cardiopulmonary disease. Electronically Signed   By: Lajean Manes M.D.   On: 03/20/2019 09:54

## 2019-03-20 ENCOUNTER — Encounter: Payer: Self-pay | Admitting: Family Medicine

## 2019-03-20 ENCOUNTER — Other Ambulatory Visit: Payer: Self-pay

## 2019-03-20 ENCOUNTER — Ambulatory Visit (HOSPITAL_BASED_OUTPATIENT_CLINIC_OR_DEPARTMENT_OTHER)
Admission: RE | Admit: 2019-03-20 | Discharge: 2019-03-20 | Disposition: A | Discharge status: Home / Self Care | Payer: 59 | Source: Ambulatory Visit | Attending: Family Medicine | Admitting: Family Medicine

## 2019-03-20 ENCOUNTER — Ambulatory Visit: Payer: 59 | Admitting: Family Medicine

## 2019-03-20 VITALS — BP 110/70 | HR 92 | Temp 96.0°F | Resp 16 | Ht 68.0 in | Wt 197.0 lb

## 2019-03-20 DIAGNOSIS — U071 COVID-19: Secondary | ICD-10-CM | POA: Diagnosis not present

## 2019-03-20 DIAGNOSIS — R9431 Abnormal electrocardiogram [ECG] [EKG]: Secondary | ICD-10-CM | POA: Diagnosis not present

## 2019-03-20 DIAGNOSIS — R0602 Shortness of breath: Secondary | ICD-10-CM | POA: Insufficient documentation

## 2019-03-20 NOTE — Patient Instructions (Signed)
It was great to see you again today- I will be in touch with your chest film, and we will set up an echocardiogram to check on your heart.  If you are getting worse or have any other concerns in the meantime please contact me

## 2019-04-02 ENCOUNTER — Other Ambulatory Visit: Payer: Self-pay

## 2019-04-02 ENCOUNTER — Encounter: Payer: Self-pay | Admitting: Family Medicine

## 2019-04-02 ENCOUNTER — Ambulatory Visit (HOSPITAL_COMMUNITY): Payer: 59 | Attending: Internal Medicine

## 2019-04-02 DIAGNOSIS — R9431 Abnormal electrocardiogram [ECG] [EKG]: Secondary | ICD-10-CM | POA: Insufficient documentation

## 2019-04-22 DIAGNOSIS — N920 Excessive and frequent menstruation with regular cycle: Secondary | ICD-10-CM | POA: Diagnosis not present

## 2019-04-22 DIAGNOSIS — Z01419 Encounter for gynecological examination (general) (routine) without abnormal findings: Secondary | ICD-10-CM | POA: Diagnosis not present

## 2019-04-22 DIAGNOSIS — Z6831 Body mass index (BMI) 31.0-31.9, adult: Secondary | ICD-10-CM | POA: Diagnosis not present

## 2019-04-25 DIAGNOSIS — F4323 Adjustment disorder with mixed anxiety and depressed mood: Secondary | ICD-10-CM | POA: Diagnosis not present

## 2019-05-02 DIAGNOSIS — F4323 Adjustment disorder with mixed anxiety and depressed mood: Secondary | ICD-10-CM | POA: Diagnosis not present

## 2019-05-06 DIAGNOSIS — G4700 Insomnia, unspecified: Secondary | ICD-10-CM | POA: Diagnosis not present

## 2019-05-06 DIAGNOSIS — R7982 Elevated C-reactive protein (CRP): Secondary | ICD-10-CM | POA: Diagnosis not present

## 2019-05-06 DIAGNOSIS — E559 Vitamin D deficiency, unspecified: Secondary | ICD-10-CM | POA: Diagnosis not present

## 2019-05-06 DIAGNOSIS — D519 Vitamin B12 deficiency anemia, unspecified: Secondary | ICD-10-CM | POA: Diagnosis not present

## 2019-05-06 DIAGNOSIS — R5383 Other fatigue: Secondary | ICD-10-CM | POA: Diagnosis not present

## 2019-05-09 DIAGNOSIS — F4323 Adjustment disorder with mixed anxiety and depressed mood: Secondary | ICD-10-CM | POA: Diagnosis not present

## 2019-05-16 DIAGNOSIS — F4323 Adjustment disorder with mixed anxiety and depressed mood: Secondary | ICD-10-CM | POA: Diagnosis not present

## 2019-06-06 DIAGNOSIS — F4323 Adjustment disorder with mixed anxiety and depressed mood: Secondary | ICD-10-CM | POA: Diagnosis not present

## 2019-06-17 DIAGNOSIS — Z1231 Encounter for screening mammogram for malignant neoplasm of breast: Secondary | ICD-10-CM | POA: Diagnosis not present

## 2019-06-19 DIAGNOSIS — F4323 Adjustment disorder with mixed anxiety and depressed mood: Secondary | ICD-10-CM | POA: Diagnosis not present

## 2019-07-03 DIAGNOSIS — F4323 Adjustment disorder with mixed anxiety and depressed mood: Secondary | ICD-10-CM | POA: Diagnosis not present

## 2019-07-18 DIAGNOSIS — F4323 Adjustment disorder with mixed anxiety and depressed mood: Secondary | ICD-10-CM | POA: Diagnosis not present

## 2019-07-25 DIAGNOSIS — F4323 Adjustment disorder with mixed anxiety and depressed mood: Secondary | ICD-10-CM | POA: Diagnosis not present

## 2019-08-08 DIAGNOSIS — F4323 Adjustment disorder with mixed anxiety and depressed mood: Secondary | ICD-10-CM | POA: Diagnosis not present

## 2019-08-14 DIAGNOSIS — F4323 Adjustment disorder with mixed anxiety and depressed mood: Secondary | ICD-10-CM | POA: Diagnosis not present

## 2019-09-26 DIAGNOSIS — F4323 Adjustment disorder with mixed anxiety and depressed mood: Secondary | ICD-10-CM | POA: Diagnosis not present

## 2019-10-10 DIAGNOSIS — F4323 Adjustment disorder with mixed anxiety and depressed mood: Secondary | ICD-10-CM | POA: Diagnosis not present

## 2019-11-14 DIAGNOSIS — Z20828 Contact with and (suspected) exposure to other viral communicable diseases: Secondary | ICD-10-CM | POA: Diagnosis not present

## 2019-11-14 IMAGING — DX DG ANKLE COMPLETE 3+V*R*
3 series · 3 of 3 positions shown · non-contrast
Comparison: None.

CLINICAL DATA: Fall with ankle pain

EXAM:
RIGHT ANKLE - COMPLETE 3+ VIEW

[ankle ap]
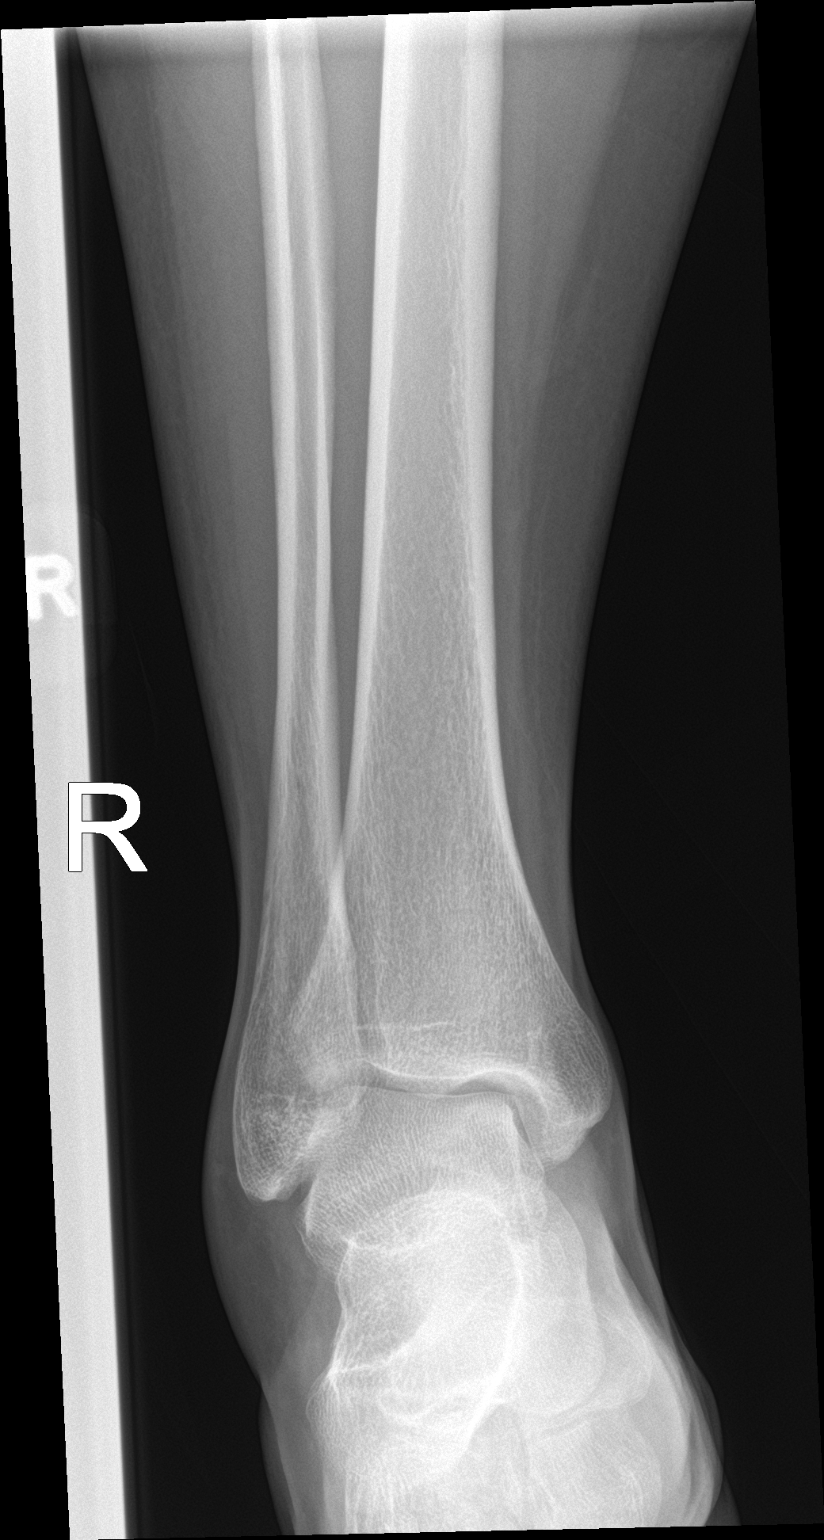

[ankle obl]
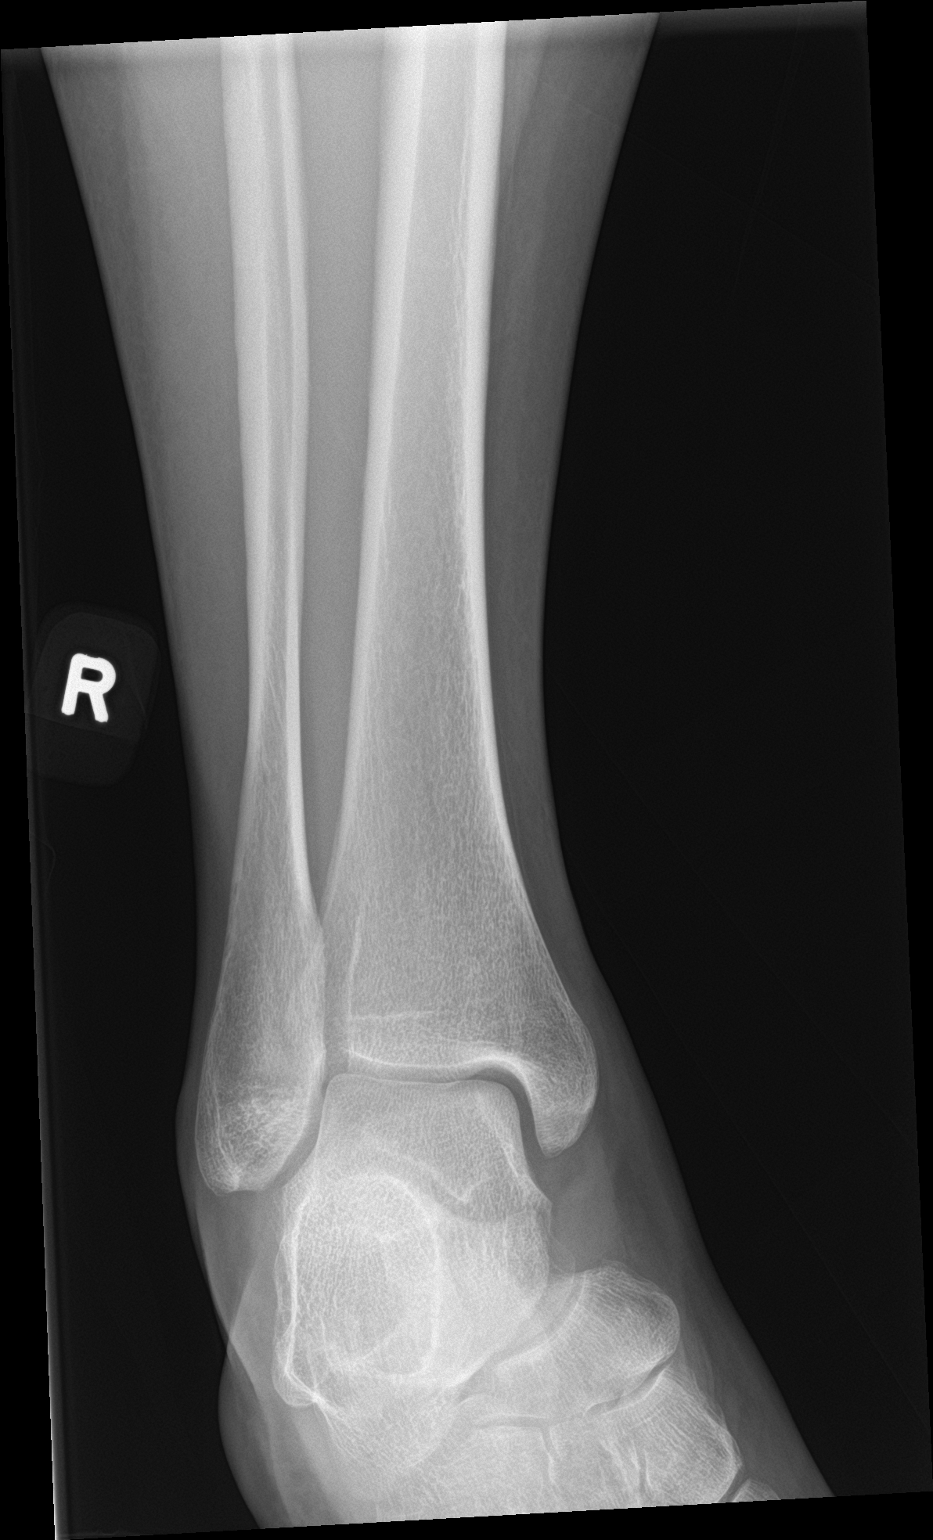

[ankle lat]
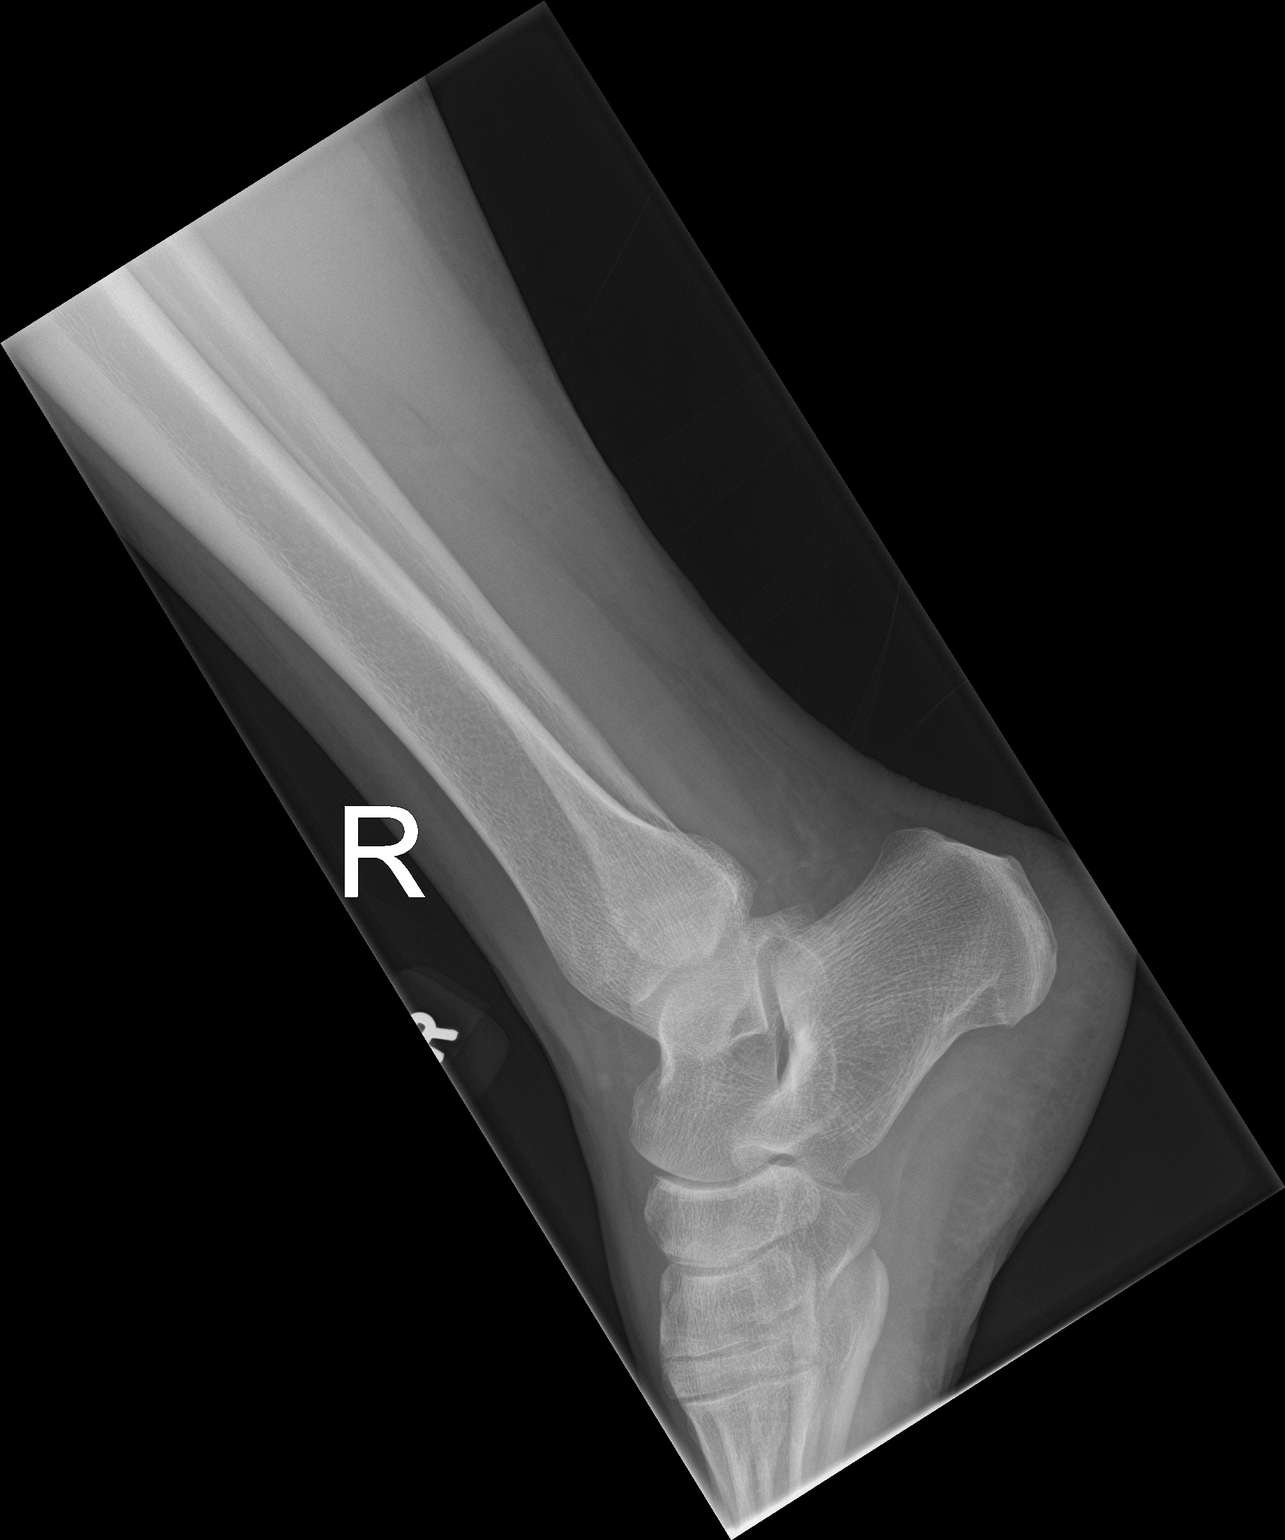

[3 of 3 positions shown; findings below may reference images not displayed]

FINDINGS: No fracture or malalignment.  Ankle mortise is symmetric.
IMPRESSION: No acute osseous abnormality. See separately dictated foot
radiograph report

## 2019-12-17 ENCOUNTER — Other Ambulatory Visit (HOSPITAL_BASED_OUTPATIENT_CLINIC_OR_DEPARTMENT_OTHER): Payer: Self-pay | Admitting: Family Medicine

## 2019-12-18 MED FILL — PROGESTERONE 100 MG CAPSULE: 100 | 30 days supply | Qty: 30 | Fill #0

## 2020-01-23 MED FILL — PROGESTERONE 100 MG CAPSULE: 100 | 30 days supply | Qty: 30 | Fill #1

## 2020-02-05 ENCOUNTER — Other Ambulatory Visit (HOSPITAL_BASED_OUTPATIENT_CLINIC_OR_DEPARTMENT_OTHER): Payer: Self-pay | Admitting: Family Medicine

## 2020-02-05 MED FILL — FLUOXETINE HCL 20 MG TABS: 20 | 90 days supply | Qty: 90 | Fill #0

## 2020-02-17 MED FILL — PROGESTERONE 100 MG CAPSULE: 100 | 30 days supply | Qty: 30 | Fill #2

## 2020-03-31 DIAGNOSIS — E663 Overweight: Secondary | ICD-10-CM | POA: Diagnosis not present

## 2020-03-31 DIAGNOSIS — G4700 Insomnia, unspecified: Secondary | ICD-10-CM | POA: Diagnosis not present

## 2020-03-31 DIAGNOSIS — R5383 Other fatigue: Secondary | ICD-10-CM | POA: Diagnosis not present

## 2020-03-31 DIAGNOSIS — E782 Mixed hyperlipidemia: Secondary | ICD-10-CM | POA: Diagnosis not present

## 2020-03-31 DIAGNOSIS — E559 Vitamin D deficiency, unspecified: Secondary | ICD-10-CM | POA: Diagnosis not present

## 2020-03-31 DIAGNOSIS — N924 Excessive bleeding in the premenopausal period: Secondary | ICD-10-CM | POA: Diagnosis not present

## 2020-04-08 DIAGNOSIS — Z3202 Encounter for pregnancy test, result negative: Secondary | ICD-10-CM | POA: Diagnosis not present

## 2020-04-08 DIAGNOSIS — R1031 Right lower quadrant pain: Secondary | ICD-10-CM | POA: Diagnosis not present

## 2020-04-14 MED FILL — PROGESTERONE 100 MG CAPSULE: 100 | 30 days supply | Qty: 30 | Fill #3

## 2020-04-20 DIAGNOSIS — H5213 Myopia, bilateral: Secondary | ICD-10-CM | POA: Diagnosis not present

## 2020-06-22 DIAGNOSIS — Z01419 Encounter for gynecological examination (general) (routine) without abnormal findings: Secondary | ICD-10-CM | POA: Diagnosis not present

## 2020-06-22 DIAGNOSIS — Z1231 Encounter for screening mammogram for malignant neoplasm of breast: Secondary | ICD-10-CM | POA: Diagnosis not present

## 2020-06-22 DIAGNOSIS — Z6833 Body mass index (BMI) 33.0-33.9, adult: Secondary | ICD-10-CM | POA: Diagnosis not present

## 2020-11-01 ENCOUNTER — Encounter (HOSPITAL_BASED_OUTPATIENT_CLINIC_OR_DEPARTMENT_OTHER): Payer: Self-pay | Admitting: Obstetrics and Gynecology

## 2020-11-01 ENCOUNTER — Other Ambulatory Visit: Payer: Self-pay

## 2020-11-01 DIAGNOSIS — Z973 Presence of spectacles and contact lenses: Secondary | ICD-10-CM

## 2020-11-01 DIAGNOSIS — N920 Excessive and frequent menstruation with regular cycle: Secondary | ICD-10-CM | POA: Diagnosis not present

## 2020-11-01 DIAGNOSIS — L409 Psoriasis, unspecified: Secondary | ICD-10-CM

## 2020-11-01 DIAGNOSIS — G43909 Migraine, unspecified, not intractable, without status migrainosus: Secondary | ICD-10-CM

## 2020-11-01 HISTORY — DX: Psoriasis, unspecified: L40.9

## 2020-11-01 HISTORY — DX: Migraine, unspecified, not intractable, without status migrainosus: G43.909

## 2020-11-01 HISTORY — DX: Presence of spectacles and contact lenses: Z97.3

## 2020-11-01 NOTE — Progress Notes (Addendum)
YOU ARE SCHEDULED FOR A COVID TEST ON  11-04-2020   . THIS TEST MUST BE DONE BEFORE SURGERY. GO TO  [facility] @ [place]   PHONE NUMBER [phone].   TURN LEFT AT THE SHIPPING AND RECEIVING SIGN AND LOOK FOR SMALL BLUE POP UP TENT UNDER BUILDING OVERHANG AT BACK OF BUILDING AND REMAIN IN YOUR CAR, THIS IS A DRIVE UP TEST.  AFTER YOUR COVID TEST , PLEASE WEAR A MASK OUT IN PUBLIC AND SOCIAL DISTANCE AND [place] YOUR HANDS FREQUENTLY. PLEASE ASK ALL YOUR CLOSE HOUSEHOLD CONTACT TO WEAR MASK OUT IN PUBLIC AND SOCIAL DISTANCE AND [place] HANDS FREQUENTLY ALSO.      Your procedure is scheduled on 11-08-2020  Report to [facility].   Call this number if you have problems the morning of surgery  :[phone].   OUR ADDRESS IS [address].  WE ARE LOCATED IN [facility].  PLEASE BRING YOUR INSURANCE CARD AND PHOTO ID DAY OF SURGERY.  ONLY ONE PERSON ALLOWED IN FACILITY WAITING AREA.                                     REMEMBER:  DO NOT EAT FOOD, CANDY GUM OR MINTS  AFTER MIDNIGHT . YOU MAY HAVE CLEAR LIQUIDS FROM MIDNIGHT UNTIL 430 AM .NO CLEAR LIQUIDS AFTER  430 AM DAY OF SURGERY.   YOU MAY  BRUSH YOUR TEETH MORNING OF SURGERY AND RINSE YOUR MOUTH OUT, NO CHEWING GUM CANDY OR MINTS.    CLEAR LIQUID DIET   Foods Allowed                                                                     Foods Excluded  Coffee and tea, regular and decaf                             liquids that you cannot  Plain Jell-O any favor except red or purple                                           see through such as: Fruit ices (not with fruit pulp)                                     milk, soups, orange juice  Iced Popsicles                                    All solid food Carbonated beverages, regular and diet                                    Cranberry, grape and apple juices Sports drinks like Gatorade Lightly  seasoned clear  broth or consume(fat free) Sugar  Sample Menu Breakfast                                Lunch                                     Supper Cranberry juice                    Beef broth                            Chicken broth Jell-O                                     Grape juice                           Apple juice Coffee or tea                        Jell-O                                      Popsicle                                                Coffee or tea                        Coffee or tea  _____________________________________________________________________     TAKE THESE MEDICATIONS MORNING OF SURGERY WITH A SIP OF WATER: none  ONE VISITOR IS ALLOWED IN WAITING ROOM ONLY DAY OF SURGERY.  NO VISITOR MAY SPEND THE NIGHT.  VISITOR ARE ALLOWED TO STAY UNTIL 800 PM.                                    DO NOT WEAR JEWERLY, MAKE UP. DO NOT WEAR LOTIONS, POWDERS, PERFUMES OR NAIL POLISH. DO NOT SHAVE FOR 48 HOURS PRIOR TO DAY OF SURGERY. MEN MAY SHAVE FACE AND NECK. CONTACTS, GLASSES, OR DENTURES MAY NOT BE WORN TO SURGERY.                                    [facility] IS NOT RESPONSIBLE  FOR ANY BELONGINGS.                                                                    Marland Kitchen            - Preparing for Surgery Before surgery, you can play an important role.  Because skin  is not sterile, your skin needs to be as free of germs as possible.  You can reduce the number of germs on your skin by washing with CHG (chlorahexidine gluconate) soap before surgery.  CHG is an antiseptic cleaner which kills germs and bonds with the skin to continue killing germs even after washing. Please DO NOT use if you have an allergy to CHG or antibacterial soaps.  If your skin becomes reddened/irritated stop using the CHG and inform your nurse when you arrive at Short Stay. Do not shave (including legs and underarms) for at least 48 hours prior to the first CHG shower.  You may shave  your face/neck. Please follow these instructions carefully:  1.  Shower with CHG Soap the night before surgery and the  morning of Surgery.  2.  If you choose to wash your hair, wash your hair first as usual with your  normal  shampoo.  3.  After you shampoo, rinse your hair and body thoroughly to remove the  shampoo.                            4.  Use CHG as you would any other liquid soap.  You can apply chg directly  to the skin and wash                      Gently with a scrungie or clean washcloth.  5.  Apply the CHG Soap to your body ONLY FROM THE NECK DOWN.   Do not use on face/ open                           Wound or open sores. Avoid contact with eyes, ears mouth and genitals (private parts).                       Wash face,  Genitals (private parts) with your normal soap.             6.  Wash thoroughly, paying special attention to the area where your surgery  will be performed.  7.  Thoroughly rinse your body with warm water from the neck down.  8.  DO NOT shower/wash with your normal soap after using and rinsing off  the CHG Soap.                9.  Pat yourself dry with a clean towel.            10.  Wear clean pajamas.            11.  Place clean sheets on your bed the night of your first shower and do not  sleep with pets. Day of Surgery : Do not apply any lotions/deodorants the morning of surgery.  Please wear clean clothes to the hospital/surgery center.  FAILURE TO FOLLOW THESE INSTRUCTIONS MAY RESULT IN THE CANCELLATION OF YOUR SURGERY PATIENT SIGNATURE_________________________________  NURSE SIGNATURE__________________________________  ________________________________________________________________________                                                        QUESTIONS Hansel Feinstein PRE OP NURSE PHONE [phone].

## 2020-11-01 NOTE — Progress Notes (Signed)
Spoke w/ via phone for pre-op interview---pt Lab needs dos---- urine poct per anesthesia surgery orders pending              Lab results------lab appt 11-04-2020 930 COVID test -----11-04-2020 overnight stay Arrive at -------530 am 11-08-2020 NPO after MN NO Solid Food.  Clear li quids from MN until---430 am Med rec completed Medications to take morning of surgery -----none Diabetic medication -----n/a Patient instructed no nail polish to be worn day of surgery Patient instructed to bring photo id and insurance card day of surgery Patient aware to have Driver (ride ) / caregiver  spouse [name]  for 24 hours after surgery  Patient Special Instructions -----pt given overnight stay instructions Pre-Op special Istructions -----surgeyr odrers req dr Gaetano Net epic ib Patient verbalized understanding of instructions that were given at this phone interview. Patient denies shortness of breath, chest pain, fever, cough at this phone interview.   Pt worked up for abnormal ekg, echo 04-02-2019 charte/pic, ekg 03-20-2019 chart/epic, chest xray 03-20-2019 epic

## 2020-11-04 ENCOUNTER — Encounter (HOSPITAL_COMMUNITY): Admission: RE | Admit: 2020-11-04 | Payer: 59 | Source: Ambulatory Visit

## 2020-11-04 ENCOUNTER — Encounter (HOSPITAL_COMMUNITY): Payer: Self-pay

## 2020-11-08 ENCOUNTER — Telehealth: Payer: Self-pay | Admitting: Physician Assistant

## 2020-11-08 ENCOUNTER — Ambulatory Visit (HOSPITAL_BASED_OUTPATIENT_CLINIC_OR_DEPARTMENT_OTHER): Admission: RE | Admit: 2020-11-08 | Payer: 59 | Source: Home / Self Care | Admitting: Obstetrics and Gynecology

## 2020-11-08 HISTORY — DX: Abnormal electrocardiogram (ECG) (EKG): R94.31

## 2020-11-08 SURGERY — HYSTERECTOMY, VAGINAL, LAPAROSCOPY-ASSISTED, WITH SALPINGECTOMY
Anesthesia: Choice | Laterality: Bilateral

## 2020-11-08 NOTE — Telephone Encounter (Signed)
Scheduled appt per 9/14 referral. Pt is aware of appt date and time. Pt is also aware to arrive 15 mins early for her appt.

## 2020-11-10 ENCOUNTER — Inpatient Hospital Stay: Payer: 59 | Attending: Physician Assistant | Admitting: Physician Assistant

## 2020-11-10 ENCOUNTER — Other Ambulatory Visit: Payer: Self-pay

## 2020-11-10 ENCOUNTER — Inpatient Hospital Stay: Payer: 59

## 2020-11-10 VITALS — BP 137/84 | HR 76 | Temp 98.4°F | Resp 17 | Wt 221.2 lb

## 2020-11-10 DIAGNOSIS — D500 Iron deficiency anemia secondary to blood loss (chronic): Secondary | ICD-10-CM | POA: Diagnosis not present

## 2020-11-10 DIAGNOSIS — Z8249 Family history of ischemic heart disease and other diseases of the circulatory system: Secondary | ICD-10-CM

## 2020-11-10 DIAGNOSIS — Z9189 Other specified personal risk factors, not elsewhere classified: Secondary | ICD-10-CM

## 2020-11-10 LAB — CBC WITH DIFFERENTIAL (CANCER CENTER ONLY)
Abs Immature Granulocytes: 0.04 10*3/uL (ref 0.00–0.07)
Basophils Absolute: 0.1 10*3/uL (ref 0.0–0.1)
Basophils Relative: 1 %
Eosinophils Absolute: 0.2 10*3/uL (ref 0.0–0.5)
Eosinophils Relative: 2 %
HCT: 40.4 % (ref 36.0–46.0)
Hemoglobin: 12.7 g/dL (ref 12.0–15.0)
Immature Granulocytes: 0 %
Lymphocytes Relative: 20 %
Lymphs Abs: 2.2 10*3/uL (ref 0.7–4.0)
MCH: 26 pg (ref 26.0–34.0)
MCHC: 31.4 g/dL (ref 30.0–36.0)
MCV: 82.8 fL (ref 80.0–100.0)
Monocytes Absolute: 0.7 10*3/uL (ref 0.1–1.0)
Monocytes Relative: 6 %
Neutro Abs: 7.8 10*3/uL — ABNORMAL HIGH (ref 1.7–7.7)
Neutrophils Relative %: 71 %
Platelet Count: 390 10*3/uL (ref 150–400)
RBC: 4.88 MIL/uL (ref 3.87–5.11)
RDW: 13.6 % (ref 11.5–15.5)
WBC Count: 10.9 10*3/uL — ABNORMAL HIGH (ref 4.0–10.5)
nRBC: 0 % (ref 0.0–0.2)

## 2020-11-10 LAB — CMP (CANCER CENTER ONLY)
ALT: 18 U/L (ref 0–44)
AST: 18 U/L (ref 15–41)
Albumin: 3.9 g/dL (ref 3.5–5.0)
Alkaline Phosphatase: 85 U/L (ref 38–126)
Anion gap: 8 (ref 5–15)
BUN: 10 mg/dL (ref 6–20)
CO2: 26 mmol/L (ref 22–32)
Calcium: 9.3 mg/dL (ref 8.9–10.3)
Chloride: 106 mmol/L (ref 98–111)
Creatinine: 0.76 mg/dL (ref 0.44–1.00)
GFR, Estimated: >60 mL/min (ref >60)
Glucose, Bld: 94 mg/dL (ref 70–99)
Potassium: 4.2 mmol/L (ref 3.5–5.1)
Sodium: 140 mmol/L (ref 135–145)
Total Bilirubin: 0.3 mg/dL (ref 0.3–1.2)
Total Protein: 7.5 g/dL (ref 6.5–8.1)

## 2020-11-10 MED ORDER — ENOXAPARIN SODIUM 40 MG/0.4ML IJ SOSY: 40.0000 mg | PREFILLED_SYRINGE | INTRAMUSCULAR | 0 refills | Status: DC

## 2020-11-10 NOTE — Progress Notes (Signed)
[name] [phone]   [phone]  INITIAL CONSULT NOTE  Patient Care Team: Copland, Gay Filler, MD as PCP - General (Family Medicine) Latoya Farrier, MD as Consulting Physician (Obstetrics and Gynecology)  CHIEF COMPLAINTS/PURPOSE OF CONSULTATION:  Evaluation for potential clotting disorder prior to hysterectomy  HISTORY OF PRESENTING ILLNESS:  [name] 45 y.o. female with medical history significant for asthma, eczema, depression, iron deficiency anemia due to chronic blood loss and B12 deficiency who presents for evaluation for potential coagulopathy prior to upcoming hysterectomy. She has a history of heavy, prolonged periods that she reports having for the past 5-6 years. Her OB/GYN has recommended hysterectomy, specific date to be set following this appointment. She reports that her mother has a history of a PE following a knee replacement surgery back in 2011. Her mother was started on coumadin which was stopped after several months. However she developed another PE within a year after stopping coumadin, patient reports her mother was evaluated by a hematologist who could not find a provoking source at the time. Pt believes her mother may have had genetic testing and reports the physician advised her and her siblings to be tested in the future, though she cannot recall if the results came back showing her mother had any inherited coagulopathy. Her mother was started on lifelong coagulation following second PE. Patient reports she has not had any genetic testing or workup in the past for any clotting conditions. Patient also states her brother developed a PE following arthroscopic surgery on his knee in 2021, he was started on Eliquis which was discontinued after a few months. She reports a family history of stroke for her maternal grandfather and paternal grandmother.  [name] denies any personal history of blood clots. She reports she has had  two c-sections, a tubal ligation as well as a arthroscopic knee surgery in the past without any complications. She is not currently on any hormonal contraception/medications. She denies any smoking or tobacco use. She reports that for her heavy periods, she is taking PO iron once daily. She reports needing IV iron infusions in the past. She gets her periods every three weeks, and reports that they last for about 10 days, with 4-5 days of heavy bleeding.   Today, she has no concerns other than increasing fatigue and some mild shortness of breath on exertion. She denies any chest pain or cough. She denies any fevers, sweats, chills or weight loss. Denies pagophagia, easy bruising or any bleeding other than heavy periods. Denies any nausea, vomiting, diarrhea or constipation.   MEDICAL HISTORY:  Past Medical History:  Diagnosis Date   Abnormal EKG    worked up  with  echo 04-02-2019,sr decreasing r wave progression   Anemia    Anxiety    Chicken pox    as child   Depression    Headache(784.0)    IBS (irritable bowel syndrome)    Migraines 11/01/2020   none recent   Placenta previa 02/20/2010   with current pregnancy-type and cross 2 Units per protocol   PONV (postoperative nausea and vomiting)    likes scopolamine patch   Psoriasis 11/01/2020   Syringomyelia, acquired ([place])    Wears glasses 11/01/2020    SURGICAL HISTORY: Past Surgical History:  Procedure Laterality Date   APPENDECTOMY  02/21/2003   laparoscopic   arthroscopic knee Left 02/21/1988   CESAREAN SECTION  12/27/2010   Procedure: CESAREAN SECTION;  Surgeon: [name];  Location: [facility];  Service: Gynecology;  Laterality: N/A;   CESAREAN SECTION N/A 02/03/2015   Procedure: CESAREAN SECTION;  Surgeon: Latoya Farrier, MD;  Location: [facility];  Service: Obstetrics;  Laterality: N/A;   left breast reduction  02/20/1994   TUBAL LIGATION Bilateral 02/03/2015   Procedure: BILATERAL TUBAL LIGATION;  Surgeon: Latoya Farrier,  MD;  Location: [facility];  Service: Obstetrics;  Laterality: Bilateral;   WISDOM TOOTH EXTRACTION  2001    SOCIAL HISTORY: Social History   Socioeconomic History   Marital status: Married    Spouse name: Not on file   Number of children: 3   Years of education: Not on file   Highest education level: Not on file  Occupational History   Occupation: RN  Tobacco Use   Smoking status: Never   Smokeless tobacco: Never  Vaping Use   Vaping Use: Never used  Substance and Sexual Activity   Alcohol use: No    Comment: rarely   Drug use: No   Sexual activity: Yes  Other Topics Concern   Not on file  Social History Narrative   Patient lives at home with her husband [name]). 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   Daughter born 2012   Son 2006 (special needs)- has uncontrolled seizures   Married   Therapist, sports   Social Determinants of Sales executive: Not on [employer] Insecurity: Not on file  Transportation Needs: Not on file  Physical Activity: Not on file  Stress: Not on file  Social Connections: Not on file  Intimate Partner Violence: Not on file    FAMILY HISTORY: Family History  Problem Relation Age of Onset   High blood pressure Mother    Arthritis Mother    Hypertension Mother    Other Mother        "Multisystem atrophy"   Clotting disorder Mother    Irritable bowel syndrome Mother    Alcohol abuse Father    Arthritis Father    Colon polyps Father    High blood pressure Brother    Prostate cancer Maternal Grandfather    Colon cancer Maternal Uncle    Stomach cancer Neg Hx     ALLERGIES:  is allergic to topamax [topiramate].  MEDICATIONS:  Current Outpatient Medications  Medication Sig Dispense Refill   enoxaparin (LOVENOX) 40 MG/0.4ML injection Inject 0.4 mLs (40 mg total) into the skin daily. 10 mL 0   Ferrous Gluconate-C-Folic Acid (IRON-C PO) Take by mouth daily.     ibuprofen (ADVIL,MOTRIN) 600 MG tablet Take 1  tablet (600 mg total) by mouth every 6 (six) hours as needed. 30 tablet 1   Multiple Vitamin (MULTIVITAMIN) tablet Take 1 tablet by mouth daily.     OVER THE COUNTER MEDICATION Omega 3 fish oil 2 caps bid     UNABLE TO FIND Vitamin b 1000 mg daily     UNABLE TO FIND Curcumen 2 caps bid     VITAMIN D PO Take 5,000 Units by mouth daily.     No current facility-administered medications for this visit.    REVIEW OF SYSTEMS:   Constitutional: ( + ) fatigue, ( - ) fevers, ( - )  chills , ( - ) night sweats Eyes: ( - ) blurriness of vision, ( - ) double vision, ( - ) watery eyes Ears, nose, mouth, throat, and face: ( - ) mucositis, ( - ) sore throat Respiratory: ( - ) cough, ( + )  dyspnea, ( - ) wheezes Cardiovascular: ( - ) palpitation, ( - ) chest discomfort, ( - ) lower extremity swelling Gastrointestinal:  ( - ) nausea, ( - ) heartburn, ( - ) change in bowel habits Skin: ( - ) abnormal skin rashes Lymphatics: ( - ) new lymphadenopathy, ( - ) easy bruising Neurological: ( - ) numbness, ( - ) tingling, ( - ) new weaknesses Behavioral/Psych: ( - ) mood change, ( - ) new changes  All other systems were reviewed with the patient and are negative.  PHYSICAL EXAMINATION: ECOG PERFORMANCE STATUS: 0 - Asymptomatic  Vitals:   11/10/20 1106  BP: 137/84  Pulse: 76  Resp: 17  Temp: 98.4 F (36.9 C)  SpO2: 98%   Filed Weights   11/10/20 1106  Weight: 221 lb 3.2 oz (100.3 kg)    GENERAL: well appearing female in NAD  SKIN: skin color, texture, turgor are normal, no rashes or significant lesions EYES: conjunctiva are pink and non-injected, sclera clear OROPHARYNX: no exudate, no erythema; lips, buccal mucosa, and tongue normal  NECK: supple, non-tender LYMPH:  no palpable lymphadenopathy in the cervical, axillary or supraclavicular lymph nodes.  LUNGS: clear to auscultation and percussion with normal breathing effort HEART: regular rate & rhythm and no murmurs and no lower extremity  edema ABDOMEN: soft, mildly tender, non-distended, normal bowel sounds Musculoskeletal: no cyanosis of digits and no clubbing  PSYCH: alert & oriented x 3, fluent speech NEURO: no focal motor/sensory deficits  LABORATORY DATA:  I have reviewed the data as listed CBC Latest Ref Rng & Units 11/10/2020 03/17/2019 03/27/2018  WBC 4.0 - 10.5 K/uL 10.9(H) 9.7 11.9(H)  Hemoglobin 12.0 - 15.0 g/dL 12.7 13.3 14.2  Hematocrit 36.0 - 46.0 % 40.4 40.5 42.7  Platelets 150 - 400 K/uL 390 377.0 425.0(H)    CMP Latest Ref Rng & Units 11/10/2020 03/27/2018 06/22/2017  Glucose 70 - 99 mg/dL 94 112(H) 93  BUN 6 - 20 mg/dL 10 8 7   Creatinine 0.44 - 1.00 mg/dL 0.76 0.66 0.75  Sodium 135 - 145 mmol/L 140 138 138  Potassium 3.5 - 5.1 mmol/L 4.2 4.4 3.7  Chloride 98 - 111 mmol/L 106 104 103  CO2 22 - 32 mmol/L 26 26 28   Calcium 8.9 - 10.3 mg/dL 9.3 9.5 9.5  Total Protein 6.5 - 8.1 g/dL 7.5 - 7.3  Total Bilirubin 0.3 - 1.2 mg/dL 0.3 - 0.4  Alkaline Phos 38 - 126 U/L 85 - 110  AST 15 - 41 U/L 18 - 19  ALT 0 - 44 U/L 18 - 24    ASSESSMENT & PLAN [name] is a 45 y.o. female who presents to the clinic for evaluation for perioperative anticoagulation prior to upcoming hysterectomy. We reviewed patient's family history of venous thromboembolism and unknown inheritable hypercoagulable conditions.  The Caprini VTE score is 7 that puts patient at a 4.0% VTE risk. The recommendation is VTE prophylaxis with penumatic compression devices and Lovenox 40 mg injections daily for 10 days.   We recommend patient proceed with labs today to check CBC, CMP, Factor 5 Leiden and Prothrombin mutation status. If there is evidence of Factor 5 Leiden or Prothrombin mutation, we recommend anticoagulation for 30 days.     #VTE risk: --Caprini VTE score = 7 points with 4.0% VTE risk. --Recommend penumatic compression and Lovenox 40 mg daily x 10 days following surgery. Sent prescription of Lovenox to pharmacy on file.  --Labs  today to check CBC, CMP, Factor  5 Leiden and Prothrombin gene.  --If there is evidence of Factor 5 Leiden or Prothrombin mutation, we recommend extending anticoagulation for 30 days --Patient will follow up once surgery date is finalized --RTC 3 weeks after surgery with labs.    Orders Placed This Encounter  Procedures   CBC with Differential ([facility] Only)    Standing Status:   Future    Number of Occurrences:   1    Standing Expiration Date:   11/10/2021   CMP ([facility] only)    Standing Status:   Future    Number of Occurrences:   1    Standing Expiration Date:   11/10/2021   Factor 5 Leiden*    Standing Status:   Future    Number of Occurrences:   1    Standing Expiration Date:   11/10/2021   Prothrombin gene mutation*    Standing Status:   Future    Number of Occurrences:   1    Standing Expiration Date:   11/10/2021    All questions were answered. The patient knows to call the clinic with any problems, questions or concerns.  I have spent a total of 60 minutes minutes of face-to-face and non-face-to-face time, preparing to see the patient, obtaining and/or reviewing separately obtained history, performing a medically appropriate examination, counseling and educating the patient, ordering medications/tests, documenting clinical information in the electronic health record, and care coordination.   Dede Query, PA-C Department of Hematology/Oncology [place] at [facility] Phone: [phone]  Patient was seen with Dr. Lorenso Courier.   I have read the above note and personally examined the patient. I agree with the assessment and plan as noted above.  Briefly [name] is a 45 year old female with strong family history of VTE who presents for recommendations regarding risk reduction of VTE following surgery.  At this time given her strong family history Caprini score is 7, which confers about a 4% risk of VTE following surgery.  As such the  recommendation would be for 10 days of Lovenox therapy with pneumatic compression.  In the event she was found to have a heritable denies mutation this would extend the duration to 30 days.  As such we will order factor V Leiden and prothrombin gene mutations.  Final recommendations regarding treatment will be issued once we receive the final genetic testing.   Ledell Peoples, MD Department of Hematology/Oncology [place] at [facility] Phone: [phone] Pager: [phone] Email: [email or web address]

## 2020-11-15 LAB — FACTOR 5 LEIDEN

## 2020-11-16 ENCOUNTER — Telehealth: Payer: Self-pay | Admitting: Physician Assistant

## 2020-11-16 ENCOUNTER — Encounter: Payer: Self-pay | Admitting: Family

## 2020-11-16 DIAGNOSIS — Z8639 Personal history of other endocrine, nutritional and metabolic disease: Secondary | ICD-10-CM

## 2020-11-16 LAB — PROTHROMBIN GENE MUTATION

## 2020-11-16 NOTE — Telephone Encounter (Signed)
I called Ms. Brylynn Hanssen to review lab results form 11/10/2020. There is no evidence of Factor 5 Leiden or Prothrombin gene mutations. CBC did not show any evidence of anemia. CMP was unremarkable. Recommendation is to 10 days of Lovenox following her planned hysterectomy, currently scheduled for 01/24/2021. Patient will contact us if her surgery is rescheduled. We will contact patient approximately a week after surgery to evaluate if additional days of anticoagulation is needed based on mobility.   Patient inquired about iron deficiency. We will order labs tomorrow to check iron panel since there is no evidence of anemia. If she has iron deficiency, we will arrange for IV iron.   Patient expressed understanding of the plan provided.

## 2020-11-17 ENCOUNTER — Inpatient Hospital Stay: Payer: 59

## 2020-11-17 ENCOUNTER — Telehealth: Payer: Self-pay | Admitting: Physician Assistant

## 2020-11-17 ENCOUNTER — Other Ambulatory Visit: Payer: Self-pay

## 2020-11-17 DIAGNOSIS — D500 Iron deficiency anemia secondary to blood loss (chronic): Secondary | ICD-10-CM | POA: Diagnosis not present

## 2020-11-17 DIAGNOSIS — Z8639 Personal history of other endocrine, nutritional and metabolic disease: Secondary | ICD-10-CM

## 2020-11-17 DIAGNOSIS — E611 Iron deficiency: Secondary | ICD-10-CM | POA: Insufficient documentation

## 2020-11-17 LAB — IRON AND TIBC
Iron: 58 ug/dL (ref 41–142)
Saturation Ratios: 17 % — ABNORMAL LOW (ref 21–57)
TIBC: 335 ug/dL (ref 236–444)
UIBC: 277 ug/dL (ref 120–384)

## 2020-11-17 LAB — RETIC PANEL
Immature Retic Fract: 23.8 % — ABNORMAL HIGH (ref 2.3–15.9)
RBC.: 4.51 MIL/uL (ref 3.87–5.11)
Retic Count, Absolute: 61.3 10*3/uL (ref 19.0–186.0)
Retic Ct Pct: 1.4 % (ref 0.4–3.1)
Reticulocyte Hemoglobin: 31.1 pg (ref >27.9)

## 2020-11-17 LAB — FERRITIN: Ferritin: 17 ng/mL (ref 11–307)

## 2020-11-17 NOTE — Telephone Encounter (Signed)
I called [name] and reviewed lab results from earlier today. There is evidence of iron deficiency without anemia. Iron saturation 17% and Ferritin 17. Since she is on oral iron supplementation, it is appropriate to proceed with IV iron infusions. I will follow up with the patient 8 weeks after completion of IV iron to repeat labs. Patient expressed understanding of the plan provided.

## 2020-11-18 ENCOUNTER — Other Ambulatory Visit: Payer: Self-pay | Admitting: Pharmacy Technician

## 2020-11-18 ENCOUNTER — Telehealth: Payer: Self-pay | Admitting: Pharmacy Technician

## 2020-11-18 NOTE — Telephone Encounter (Signed)
Dr. Charlies Silvers, I will delete the Feraheme order.  Please enter the venofer in the referral que and we will schedule the patient as soon as possible. Thanks.

## 2020-11-18 NOTE — Addendum Note (Signed)
Addended by: Tresa Res on: 11/18/2020 12:56 PM   Modules accepted: Orders

## 2020-11-18 NOTE — Telephone Encounter (Signed)
Dr. Charlies Silvers,  Auth Submission: DENIED/FERAHEME Payer: [employer] Medication & CPT/J Code(s) submitted: Feraheme (ferumoxytol) [id number] Route of submission (phone, fax, portal): PHONE - [phone] Auth type: Buy/Bill Units/visits requested: 2 Reference number: [id number]  [name] has been denied due to patient has not tried and or failed step therapy. Venofer Infed Ferrlicit  Would you like to try Venofer? Please advise.  Kim.

## 2020-11-26 ENCOUNTER — Other Ambulatory Visit: Payer: Self-pay

## 2020-11-26 ENCOUNTER — Ambulatory Visit (INDEPENDENT_AMBULATORY_CARE_PROVIDER_SITE_OTHER): Payer: 59

## 2020-11-26 VITALS — BP 122/70 | HR 62 | Temp 98.4°F | Resp 16 | Ht 68.0 in | Wt 222.2 lb

## 2020-11-26 DIAGNOSIS — E611 Iron deficiency: Secondary | ICD-10-CM

## 2020-11-26 MED ORDER — ACETAMINOPHEN 325 MG PO TABS
650.0000 mg | ORAL_TABLET | Freq: Once | ORAL | Status: AC
  Administered 2020-11-26: 650 mg via ORAL
  Filled 2020-11-26: qty 2

## 2020-11-26 MED ORDER — DIPHENHYDRAMINE HCL 25 MG PO CAPS
50.0000 mg | ORAL_CAPSULE | Freq: Once | ORAL | Status: AC
  Administered 2020-11-26: 50 mg via ORAL
  Filled 2020-11-26: qty 2

## 2020-11-26 MED ORDER — IRON SUCROSE 20 MG/ML IV SOLN
200.0000 mg | Freq: Once | INTRAVENOUS | Status: AC
  Administered 2020-11-26: 200 mg via INTRAVENOUS
  Filled 2020-11-26: qty 10

## 2020-11-26 MED ORDER — DIPHENHYDRAMINE HCL 50 MG/ML IJ SOLN: 50.0000 mg | Freq: Once | INTRAMUSCULAR | Status: DC | PRN

## 2020-11-26 MED ORDER — SODIUM CHLORIDE 0.9 % IV SOLN: Freq: Once | INTRAVENOUS | Status: DC | PRN

## 2020-11-26 MED ORDER — ALBUTEROL SULFATE HFA 108 (90 BASE) MCG/ACT IN AERS: 2.0000 | INHALATION_SPRAY | Freq: Once | RESPIRATORY_TRACT | Status: DC | PRN

## 2020-11-26 MED ORDER — EPINEPHRINE 0.3 MG/0.3ML IJ SOAJ: 0.3000 mg | Freq: Once | INTRAMUSCULAR | Status: DC | PRN

## 2020-11-26 MED ORDER — METHYLPREDNISOLONE SODIUM SUCC 125 MG IJ SOLR: 125.0000 mg | Freq: Once | INTRAMUSCULAR | Status: DC | PRN

## 2020-11-26 MED ORDER — FAMOTIDINE IN NACL 20-0.9 MG/50ML-% IV SOLN: 20.0000 mg | Freq: Once | INTRAVENOUS | Status: DC | PRN

## 2020-11-26 NOTE — Progress Notes (Signed)
Diagnosis: Iron Deficiency Anemia  Provider:  Marshell Garfinkel, MD  Procedure: Infusion  IV Type: Peripheral, IV Location: L Forearm  Venofer (Iron Sucrose), Dose: 200 mg  Infusion Start Time: 6579  Infusion Stop Time: 0958  Post Infusion IV Care: Observation period completed and Peripheral IV Discontinued  Discharge: Condition: Good, Destination: Home . AVS provided to patient.   Performed by:  Crystalina Stodghill, Sherlon Handing, LPN

## 2020-11-29 ENCOUNTER — Other Ambulatory Visit: Payer: Self-pay

## 2020-11-29 ENCOUNTER — Ambulatory Visit (INDEPENDENT_AMBULATORY_CARE_PROVIDER_SITE_OTHER): Payer: 59

## 2020-11-29 VITALS — BP 137/78 | HR 72 | Temp 98.0°F | Resp 18 | Ht 68.0 in | Wt 221.0 lb

## 2020-11-29 DIAGNOSIS — D509 Iron deficiency anemia, unspecified: Secondary | ICD-10-CM

## 2020-11-29 DIAGNOSIS — E611 Iron deficiency: Secondary | ICD-10-CM

## 2020-11-29 MED ORDER — ACETAMINOPHEN 325 MG PO TABS
650.0000 mg | ORAL_TABLET | Freq: Once | ORAL | Status: AC
  Administered 2020-11-29: 650 mg via ORAL
  Filled 2020-11-29: qty 2

## 2020-11-29 MED ORDER — FAMOTIDINE IN NACL 20-0.9 MG/50ML-% IV SOLN: 20.0000 mg | Freq: Once | INTRAVENOUS | Status: DC | PRN

## 2020-11-29 MED ORDER — ALBUTEROL SULFATE HFA 108 (90 BASE) MCG/ACT IN AERS: 2.0000 | INHALATION_SPRAY | Freq: Once | RESPIRATORY_TRACT | Status: DC | PRN

## 2020-11-29 MED ORDER — DIPHENHYDRAMINE HCL 50 MG/ML IJ SOLN: 50.0000 mg | Freq: Once | INTRAMUSCULAR | Status: DC | PRN

## 2020-11-29 MED ORDER — SODIUM CHLORIDE 0.9 % IV SOLN
200.0000 mg | Freq: Once | INTRAVENOUS | Status: AC
  Administered 2020-11-29: 200 mg via INTRAVENOUS
  Filled 2020-11-29: qty 10

## 2020-11-29 MED ORDER — EPINEPHRINE 0.3 MG/0.3ML IJ SOAJ: 0.3000 mg | Freq: Once | INTRAMUSCULAR | Status: DC | PRN

## 2020-11-29 MED ORDER — DIPHENHYDRAMINE HCL 25 MG PO CAPS: 50.0000 mg | ORAL_CAPSULE | Freq: Once | ORAL | Status: DC

## 2020-11-29 MED ORDER — METHYLPREDNISOLONE SODIUM SUCC 125 MG IJ SOLR: 125.0000 mg | Freq: Once | INTRAMUSCULAR | Status: DC | PRN

## 2020-11-29 MED ORDER — SODIUM CHLORIDE 0.9 % IV SOLN: Freq: Once | INTRAVENOUS | Status: DC | PRN

## 2020-11-29 NOTE — Progress Notes (Signed)
Diagnosis: Iron Deficiency Anemia  Provider:  Marshell Garfinkel, MD  Procedure: Infusion  IV Type: Peripheral, IV Location: RT Hand  Feraheme (Ferumoxytol), Dose: 200 mg  Infusion Start Time: 1561  Infusion Stop Time: 5379  Post Infusion IV Care: Peripheral IV Discontinued  Discharge: Condition: Good, Destination: Home . AVS provided to patient.   Performed by:  Idell Hissong, Sherlon Handing, LPN

## 2020-12-01 ENCOUNTER — Other Ambulatory Visit: Payer: Self-pay

## 2020-12-01 ENCOUNTER — Ambulatory Visit (INDEPENDENT_AMBULATORY_CARE_PROVIDER_SITE_OTHER): Payer: 59

## 2020-12-01 VITALS — BP 129/85 | HR 63 | Temp 98.0°F | Resp 18 | Ht 68.0 in | Wt 220.0 lb

## 2020-12-01 DIAGNOSIS — D509 Iron deficiency anemia, unspecified: Secondary | ICD-10-CM | POA: Diagnosis not present

## 2020-12-01 DIAGNOSIS — E611 Iron deficiency: Secondary | ICD-10-CM

## 2020-12-01 MED ORDER — ALBUTEROL SULFATE HFA 108 (90 BASE) MCG/ACT IN AERS: 2.0000 | INHALATION_SPRAY | Freq: Once | RESPIRATORY_TRACT | Status: DC | PRN

## 2020-12-01 MED ORDER — DIPHENHYDRAMINE HCL 25 MG PO CAPS: 50.0000 mg | ORAL_CAPSULE | Freq: Once | ORAL | Status: DC

## 2020-12-01 MED ORDER — METHYLPREDNISOLONE SODIUM SUCC 125 MG IJ SOLR: 125.0000 mg | Freq: Once | INTRAMUSCULAR | Status: DC | PRN

## 2020-12-01 MED ORDER — EPINEPHRINE 0.3 MG/0.3ML IJ SOAJ: 0.3000 mg | Freq: Once | INTRAMUSCULAR | Status: DC | PRN

## 2020-12-01 MED ORDER — FAMOTIDINE IN NACL 20-0.9 MG/50ML-% IV SOLN: 20.0000 mg | Freq: Once | INTRAVENOUS | Status: DC | PRN

## 2020-12-01 MED ORDER — ACETAMINOPHEN 325 MG PO TABS
650.0000 mg | ORAL_TABLET | Freq: Once | ORAL | Status: AC
  Administered 2020-12-01: 650 mg via ORAL
  Filled 2020-12-01: qty 2

## 2020-12-01 MED ORDER — DIPHENHYDRAMINE HCL 50 MG/ML IJ SOLN: 50.0000 mg | Freq: Once | INTRAMUSCULAR | Status: DC | PRN

## 2020-12-01 MED ORDER — SODIUM CHLORIDE 0.9 % IV SOLN: Freq: Once | INTRAVENOUS | Status: DC | PRN

## 2020-12-01 MED ORDER — SODIUM CHLORIDE 0.9 % IV SOLN
200.0000 mg | Freq: Once | INTRAVENOUS | Status: AC
  Administered 2020-12-01: 200 mg via INTRAVENOUS
  Filled 2020-12-01: qty 10

## 2020-12-01 NOTE — Progress Notes (Signed)
Diagnosis: Iron Deficiency Anemia  Provider:  Marshell Garfinkel, MD  Procedure: Infusion  IV Type: Peripheral, IV Location: L Antecubital  Venofer (Iron Sucrose), Dose: 200 mg  Infusion Start Time: 1009  Infusion Stop Time: 1026  Post Infusion IV Care: Peripheral IV Discontinued  Discharge: Condition: Good, Destination: Home . AVS provided to patient.   Performed by:  Leylani Duley, Sherlon Handing, LPN

## 2020-12-07 ENCOUNTER — Ambulatory Visit: Payer: 59

## 2020-12-07 ENCOUNTER — Telehealth: Payer: Self-pay | Admitting: Physician Assistant

## 2020-12-07 NOTE — Telephone Encounter (Signed)
Sch per 9/29 los, pt aware 

## 2020-12-08 ENCOUNTER — Telehealth: Payer: Self-pay | Admitting: Physician Assistant

## 2020-12-08 NOTE — Telephone Encounter (Signed)
Sch per 10/18 los, left message

## 2020-12-09 ENCOUNTER — Other Ambulatory Visit: Payer: Self-pay

## 2020-12-09 ENCOUNTER — Ambulatory Visit (INDEPENDENT_AMBULATORY_CARE_PROVIDER_SITE_OTHER): Payer: 59

## 2020-12-09 VITALS — BP 124/81 | HR 68 | Temp 97.4°F | Resp 16 | Ht 68.0 in | Wt 221.6 lb

## 2020-12-09 DIAGNOSIS — D509 Iron deficiency anemia, unspecified: Secondary | ICD-10-CM

## 2020-12-09 DIAGNOSIS — E611 Iron deficiency: Secondary | ICD-10-CM

## 2020-12-09 MED ORDER — DIPHENHYDRAMINE HCL 25 MG PO CAPS: 50.0000 mg | ORAL_CAPSULE | Freq: Once | ORAL | Status: DC

## 2020-12-09 MED ORDER — SODIUM CHLORIDE 0.9 % IV SOLN
200.0000 mg | Freq: Once | INTRAVENOUS | Status: AC
  Administered 2020-12-09: 200 mg via INTRAVENOUS
  Filled 2020-12-09: qty 10

## 2020-12-09 MED ORDER — DIPHENHYDRAMINE HCL 50 MG/ML IJ SOLN: 50.0000 mg | Freq: Once | INTRAMUSCULAR | Status: DC | PRN

## 2020-12-09 MED ORDER — METHYLPREDNISOLONE SODIUM SUCC 125 MG IJ SOLR: 125.0000 mg | Freq: Once | INTRAMUSCULAR | Status: DC | PRN

## 2020-12-09 MED ORDER — EPINEPHRINE 0.3 MG/0.3ML IJ SOAJ: 0.3000 mg | Freq: Once | INTRAMUSCULAR | Status: DC | PRN

## 2020-12-09 MED ORDER — SODIUM CHLORIDE 0.9 % IV SOLN: Freq: Once | INTRAVENOUS | Status: DC | PRN

## 2020-12-09 MED ORDER — ACETAMINOPHEN 325 MG PO TABS
650.0000 mg | ORAL_TABLET | Freq: Once | ORAL | Status: AC
  Administered 2020-12-09: 650 mg via ORAL
  Filled 2020-12-09: qty 2

## 2020-12-09 MED ORDER — FAMOTIDINE IN NACL 20-0.9 MG/50ML-% IV SOLN: 20.0000 mg | Freq: Once | INTRAVENOUS | Status: DC | PRN

## 2020-12-09 MED ORDER — ALBUTEROL SULFATE HFA 108 (90 BASE) MCG/ACT IN AERS: 2.0000 | INHALATION_SPRAY | Freq: Once | RESPIRATORY_TRACT | Status: DC | PRN

## 2020-12-09 NOTE — Progress Notes (Signed)
Diagnosis: Iron Deficiency Anemia  Provider:  Marshell Garfinkel, MD  Procedure: Infusion  IV Type: Peripheral, IV Location: R Hand  Venofer (Iron Sucrose), Dose: 200 mg  Infusion Start Time: 09.34 12/09/2020  Infusion Stop Time: 09.58 12/09/2020  Post Infusion IV Care: Peripheral IV Discontinued  Discharge: Condition: Good, Destination: Home . AVS provided to patient.   Performed by:  Arnoldo Morale, RN

## 2020-12-12 ENCOUNTER — Telehealth: Payer: 59 | Admitting: Family

## 2020-12-12 DIAGNOSIS — J029 Acute pharyngitis, unspecified: Secondary | ICD-10-CM | POA: Diagnosis not present

## 2020-12-12 DIAGNOSIS — Z20818 Contact with and (suspected) exposure to other bacterial communicable diseases: Secondary | ICD-10-CM

## 2020-12-12 MED ORDER — AMOXICILLIN 500 MG PO CAPS: 500.0000 mg | ORAL_CAPSULE | Freq: Two times a day (BID) | ORAL | 0 refills | Status: AC

## 2020-12-12 NOTE — Progress Notes (Signed)
E-Visit for Sore Throat - Strep Symptoms ? ?We are sorry that you are not feeling well.  Here is how we plan to help! ? ?Based on what you have shared with me it is likely that you have strep pharyngitis.  Strep pharyngitis is inflammation and infection in the back of the throat.  This is an infection cause by bacteria and is treated with antibiotics.  I have prescribed Amoxicillin 500 mg twice a day for 10 days. For throat pain, we recommend over the counter oral pain relief medications such as acetaminophen or aspirin, or anti-inflammatory medications such as ibuprofen or naproxen sodium. Topical treatments such as oral throat lozenges or sprays may be used as needed. Strep infections are not as easily transmitted as other respiratory infections, however we still recommend that you avoid close contact with loved ones, especially the very young and elderly.  Remember to wash your hands thoroughly throughout the day as this is the number one way to prevent the spread of infection and wipe down door knobs and counters with disinfectant. ? ? ?Home Care: ?Only take medications as instructed by your medical team. ?Complete the entire course of an antibiotic. ?Do not take these medications with alcohol. ?A steam or ultrasonic humidifier can help congestion.  You can place a towel over your head and breathe in the steam from hot water coming from a faucet. ?Avoid close contacts especially the very young and the elderly. ?Cover your mouth when you cough or sneeze. ?Always remember to wash your hands. ? ?Get Help Right Away If: ?You develop worsening fever or sinus pain. ?You develop a severe head ache or visual changes. ?Your symptoms persist after you have completed your treatment plan. ? ?Make sure you ?Understand these instructions. ?Will watch your condition. ?Will get help right away if you are not doing well or get worse. ? ? ?Thank you for choosing an e-visit. ? ?Your e-visit answers were reviewed by a board  certified advanced clinical practitioner to complete your personal care plan. Depending upon the condition, your plan could have included both over the counter or prescription medications. ? ?Please review your pharmacy choice. Make sure the pharmacy is open so you can pick up prescription now. If there is a problem, you may contact your provider through MyChart messaging and have the prescription routed to another pharmacy.  Your safety is important to us. If you have drug allergies check your prescription carefully.  ? ?For the next 24 hours you can use MyChart to ask questions about today's visit, request a non-urgent call back, or ask for a work or school excuse. ?You will get an email in the next two days asking about your experience. I hope that your e-visit has been valuable and will speed your recovery. ? ?Approximately 5 minutes was spent documenting and reviewing patient's chart.  ? ? ?

## 2020-12-13 ENCOUNTER — Ambulatory Visit: Payer: 59

## 2020-12-22 ENCOUNTER — Other Ambulatory Visit: Payer: Self-pay

## 2020-12-22 ENCOUNTER — Ambulatory Visit (INDEPENDENT_AMBULATORY_CARE_PROVIDER_SITE_OTHER): Payer: 59

## 2020-12-22 VITALS — BP 134/83 | HR 63 | Temp 97.7°F | Resp 20 | Ht 68.0 in | Wt 221.2 lb

## 2020-12-22 DIAGNOSIS — E611 Iron deficiency: Secondary | ICD-10-CM | POA: Diagnosis not present

## 2020-12-22 MED ORDER — SODIUM CHLORIDE 0.9 % IV SOLN
200.0000 mg | Freq: Once | INTRAVENOUS | Status: AC
  Administered 2020-12-22: 200 mg via INTRAVENOUS
  Filled 2020-12-22: qty 10

## 2020-12-22 MED ORDER — DIPHENHYDRAMINE HCL 25 MG PO CAPS: 50.0000 mg | ORAL_CAPSULE | Freq: Once | ORAL | Status: DC

## 2020-12-22 MED ORDER — METHYLPREDNISOLONE SODIUM SUCC 125 MG IJ SOLR: 125.0000 mg | Freq: Once | INTRAMUSCULAR | Status: DC | PRN

## 2020-12-22 MED ORDER — FAMOTIDINE IN NACL 20-0.9 MG/50ML-% IV SOLN: 20.0000 mg | Freq: Once | INTRAVENOUS | Status: DC | PRN

## 2020-12-22 MED ORDER — ACETAMINOPHEN 325 MG PO TABS
650.0000 mg | ORAL_TABLET | Freq: Once | ORAL | Status: AC
  Administered 2020-12-22: 650 mg via ORAL
  Filled 2020-12-22: qty 2

## 2020-12-22 MED ORDER — EPINEPHRINE 0.3 MG/0.3ML IJ SOAJ: 0.3000 mg | Freq: Once | INTRAMUSCULAR | Status: DC | PRN

## 2020-12-22 MED ORDER — DIPHENHYDRAMINE HCL 50 MG/ML IJ SOLN: 50.0000 mg | Freq: Once | INTRAMUSCULAR | Status: DC | PRN

## 2020-12-22 MED ORDER — SODIUM CHLORIDE 0.9 % IV SOLN: Freq: Once | INTRAVENOUS | Status: DC | PRN

## 2020-12-22 MED ORDER — ALBUTEROL SULFATE HFA 108 (90 BASE) MCG/ACT IN AERS: 2.0000 | INHALATION_SPRAY | Freq: Once | RESPIRATORY_TRACT | Status: DC | PRN

## 2020-12-22 NOTE — Progress Notes (Signed)
Diagnosis: Iron Deficiency Anemia  Provider:  Marshell Garfinkel, MD  Procedure: Infusion  IV Type: Peripheral, IV Location: R Hand  Venofer (Iron Sucrose), Dose: 200 mg  Infusion Start Time: 5056  Infusion Stop Time: 9794  Post Infusion IV Care: Peripheral IV Discontinued  Discharge: Condition: Good, Destination: Home . AVS provided to patient.   Performed by:  Hannibal Skalla, Sherlon Handing, LPN

## 2021-01-05 ENCOUNTER — Encounter: Payer: Self-pay | Admitting: Physician Assistant

## 2021-01-05 ENCOUNTER — Encounter: Payer: Self-pay | Admitting: Family

## 2021-01-05 ENCOUNTER — Encounter: Payer: Self-pay | Admitting: Emergency Medicine

## 2021-01-05 ENCOUNTER — Other Ambulatory Visit: Payer: Self-pay

## 2021-01-05 ENCOUNTER — Emergency Department
Admission: EM | Admit: 2021-01-05 | Discharge: 2021-01-05 | Disposition: A | Discharge status: Home / Self Care | Payer: 59 | Source: Home / Self Care | Attending: Family Medicine | Admitting: Family Medicine

## 2021-01-05 DIAGNOSIS — J111 Influenza due to unidentified influenza virus with other respiratory manifestations: Secondary | ICD-10-CM

## 2021-01-05 DIAGNOSIS — J020 Streptococcal pharyngitis: Secondary | ICD-10-CM | POA: Diagnosis not present

## 2021-01-05 LAB — POCT INFLUENZA A/B
Influenza A, POC: NEGATIVE
Influenza B, POC: POSITIVE — AB

## 2021-01-05 LAB — POCT RAPID STREP A (OFFICE): Rapid Strep A Screen: POSITIVE — AB

## 2021-01-05 MED ORDER — CEPHALEXIN 500 MG PO CAPS: 500.0000 mg | ORAL_CAPSULE | Freq: Two times a day (BID) | ORAL | 0 refills | Status: DC

## 2021-01-05 NOTE — ED Provider Notes (Signed)
[facility]    CSN: [id number] Arrival date & time: 01/05/21  1021      History   Chief Complaint Chief Complaint  Patient presents with   Fever    HPI [name] is a 45 y.o. female.   HPI  Patient states she had strep throat 2 to 3 weeks ago.  She took 10 full days of antibiotics.  She states the sore throat never completely went away.  She has had a little bit of a cough, her youngest child had croup.  Last night quite suddenly she had shaking chills and fever.  Temperature was to 103.8.  Is having coughing and increased sore throat today.  Past Medical History:  Diagnosis Date   Abnormal EKG    worked up  with  echo 04-02-2019,sr decreasing r wave progression   Anemia    Anxiety    Chicken pox    as child   Depression    Headache(784.0)    IBS (irritable bowel syndrome)    Migraines 11/01/2020   none recent   Placenta previa 02/20/2010   with current pregnancy-type and cross 2 Units per protocol   PONV (postoperative nausea and vomiting)    likes scopolamine patch   Psoriasis 11/01/2020   Syringomyelia, acquired ([place])    Wears glasses 11/01/2020    Patient Active Problem List   Diagnosis Date Noted   Iron deficiency 11/17/2020   At moderate risk for venous thromboembolism (VTE) 11/10/2020   Closed nondisplaced fracture of navicular bone of right foot 11/04/2018   IDA (iron deficiency anemia) 06/26/2017   Iron deficiency anemia due to chronic blood loss 06/26/2017   B12 deficiency 03/30/2017   Asthma 12/08/2016   Scalp lesion 01/01/2014   Allergic rhinitis 11/11/2013   Daytime somnolence 09/04/2013   Syringomyelia ([place]) 05/09/2013   Mass 05/09/2013   Paresthesia 07/19/2012   Depression 07/19/2012   Sensory disturbance 06/27/2012   Leg weakness 06/27/2012   Leukocytosis 06/27/2012   Microcytic anemia 06/27/2012    Past Surgical History:  Procedure Laterality Date   APPENDECTOMY  02/21/2003   laparoscopic   arthroscopic knee  Left 02/21/1988   CESAREAN SECTION  12/27/2010   Procedure: CESAREAN SECTION;  Surgeon: Shon Millet II;  Location: [facility];  Service: Gynecology;  Laterality: N/A;   CESAREAN SECTION N/A 02/03/2015   Procedure: CESAREAN SECTION;  Surgeon: Everlene Farrier, MD;  Location: [facility];  Service: Obstetrics;  Laterality: N/A;   left breast reduction  02/20/1994   TUBAL LIGATION Bilateral 02/03/2015   Procedure: BILATERAL TUBAL LIGATION;  Surgeon: Everlene Farrier, MD;  Location: [facility];  Service: Obstetrics;  Laterality: Bilateral;   WISDOM TOOTH EXTRACTION  2001    OB History     Gravida  3   Para  3   Term  2   Preterm  1   AB      Living  3      SAB      IAB      Ectopic      Multiple  0   Live Births  3            Home Medications    Prior to Admission medications   Medication Sig Start Date End Date Taking? Authorizing Provider  cephALEXin (KEFLEX) 500 MG capsule Take 1 capsule (500 mg total) by mouth 2 (two) times daily. 01/05/21  Yes Raylene Everts, MD  enoxaparin (LOVENOX) 40 MG/0.4ML injection Inject 0.4 mLs (40  mg total) into the skin daily. 11/10/20   Lincoln Brigham, PA-C  Ferrous Gluconate-C-Folic Acid (IRON-C PO) Take by mouth daily.    [provider]  ibuprofen (ADVIL,MOTRIN) 600 MG tablet Take 1 tablet (600 mg total) by mouth every 6 (six) hours as needed. 02/06/15   Molli Posey, MD  Multiple Vitamin (MULTIVITAMIN) tablet Take 1 tablet by mouth daily.    [provider]  OVER THE COUNTER MEDICATION Omega 3 fish oil 2 caps bid    [provider]  UNABLE TO FIND Vitamin b 1000 mg daily    [provider]  VITAMIN D PO Take 5,000 Units by mouth daily.    [provider]    Family History Family History  Problem Relation Age of Onset   High blood pressure Mother    Arthritis Mother    Hypertension Mother    Other Mother        "Multisystem atrophy"   Clotting disorder Mother    Irritable bowel  syndrome Mother    Alcohol abuse Father    Arthritis Father    Colon polyps Father    High blood pressure Brother    Prostate cancer Maternal Grandfather    Colon cancer Maternal Uncle    Stomach cancer Neg Hx     Social History Social History   Tobacco Use   Smoking status: Never   Smokeless tobacco: Never  Vaping Use   Vaping Use: Never used  Substance Use Topics   Alcohol use: No    Comment: rarely   Drug use: No     Allergies   Topamax [topiramate]   Review of Systems Review of Systems See HPI  Physical Exam Triage Vital Signs ED Triage Vitals  Enc Vitals Group     BP 01/05/21 1056 119/80     Pulse Rate 01/05/21 1056 100     Resp 01/05/21 1056 18     Temp 01/05/21 1056 99.2 F (37.3 C)     Temp Source 01/05/21 1056 Oral     SpO2 01/05/21 1056 98 %     Weight 01/05/21 1058 217 lb (98.4 kg)     Height --      Head Circumference --      Peak Flow --      Pain Score 01/05/21 1058 3     Pain Loc --      Pain Edu? --      Excl. in [place]? --    No data found.  Updated Vital Signs BP 119/80 (BP Location: Left Arm)   Pulse 100   Temp 99.2 F (37.3 C) (Oral)   Resp 18   Wt 98.4 kg   LMP 12/17/2020 (Approximate)   SpO2 98%   Breastfeeding No   BMI 32.99 kg/m      Physical Exam Constitutional:      General: She is not in acute distress.    Appearance: She is well-developed. She is ill-appearing.     Comments: Pleasant.  Overweight.  Appears tired  HENT:     Head: Normocephalic and atraumatic.     Right Ear: Tympanic membrane and ear canal normal.     Left Ear: Tympanic membrane and ear canal normal.     Nose: Congestion present.     Mouth/Throat:     Pharynx: Posterior oropharyngeal erythema present.     Comments: Posterior pharynx and tonsils are deeply erythematous.  No exudate Eyes:     Conjunctiva/sclera: Conjunctivae normal.  Pupils: Pupils are equal, round, and reactive to light.  Cardiovascular:     Rate and Rhythm: Normal rate and  regular rhythm.     Heart sounds: Normal heart sounds.  Pulmonary:     Effort: Pulmonary effort is normal. No respiratory distress.     Breath sounds: Normal breath sounds.  Abdominal:     General: There is no distension.     Palpations: Abdomen is soft.  Musculoskeletal:        General: Normal range of motion.     Cervical back: Normal range of motion.  Lymphadenopathy:     Cervical: Cervical adenopathy present.  Skin:    General: Skin is warm and dry.  Neurological:     Mental Status: She is alert.     UC Treatments / Results  Labs (all labs ordered are listed, but only abnormal results are displayed) Labs Reviewed  POCT INFLUENZA A/B - Abnormal; Notable for the following components:      Result Value   Influenza B, POC Positive (*)    All other components within normal limits  POCT RAPID STREP A (OFFICE) - Abnormal; Notable for the following components:   Rapid Strep A Screen Positive (*)    All other components within normal limits    EKG   Radiology No results found.  Procedures Procedures (including critical care time)  Medications Ordered in UC Medications - No data to display  Initial Impression / Assessment and Plan / UC Course  I have reviewed the triage vital signs and the nursing notes.  Pertinent labs & imaging results that were available during my care of the patient were reviewed by me and considered in my medical decision making (see chart for details).     Patient has a positive strep test.  I do not know if she failed the 10 days of amoxicillin we will give her 10 days of cephalexin to see if this does the trick.  She knows that the influenza is a virus and requires home care with over-the-counter medication.  Call for problems Final Clinical Impressions(s) / UC Diagnoses   Final diagnoses:  Influenza  Streptococcal sore throat     Discharge Instructions      You must take 10 full days of antibiotics Drink lots of fluids Tylenol or  ibuprofen for pain and fever May use over-the-counter cough and cold medicine as needed Call for problems     ED Prescriptions     Medication Sig Dispense Auth. Provider   cephALEXin (KEFLEX) 500 MG capsule Take 1 capsule (500 mg total) by mouth 2 (two) times daily. 20 capsule Raylene Everts, MD      PDMP not reviewed this encounter.   Raylene Everts, MD 01/05/21 [phone]

## 2021-01-05 NOTE — ED Triage Notes (Signed)
Had strep throat 3 weeks ago  Cough since thursday Chills last night  Temp 103.8 Ibuprofen at 0530 (600mg ) Tylenol 1gm at 0700 No flu vaccine

## 2021-01-05 NOTE — Discharge Instructions (Signed)
You must take 10 full days of antibiotics Drink lots of fluids Tylenol or ibuprofen for pain and fever May use over-the-counter cough and cold medicine as needed Call for problems

## 2021-01-07 ENCOUNTER — Other Ambulatory Visit (HOSPITAL_BASED_OUTPATIENT_CLINIC_OR_DEPARTMENT_OTHER): Payer: Self-pay

## 2021-01-07 ENCOUNTER — Telehealth: Payer: Self-pay | Admitting: Family Medicine

## 2021-01-07 ENCOUNTER — Encounter: Payer: Self-pay | Admitting: Family Medicine

## 2021-01-07 MED ORDER — ALBUTEROL SULFATE HFA 108 (90 BASE) MCG/ACT IN AERS
2.0000 | INHALATION_SPRAY | Freq: Four times a day (QID) | RESPIRATORY_TRACT | 1 refills | Status: DC | PRN
  Filled 2021-01-07: qty 18, 25d supply, fill #0

## 2021-01-07 NOTE — Telephone Encounter (Signed)
Please advise 

## 2021-01-07 NOTE — Telephone Encounter (Signed)
Pt was recently seen at the ed in [place] on 11/16 and was diagnosed with the flu. Pt is starting to wheeze and would like an inhaler prescribed if possible. No openings today. Please advise.

## 2021-01-18 ENCOUNTER — Other Ambulatory Visit: Payer: Self-pay | Admitting: Physician Assistant

## 2021-01-18 ENCOUNTER — Encounter (HOSPITAL_BASED_OUTPATIENT_CLINIC_OR_DEPARTMENT_OTHER): Payer: Self-pay | Admitting: Obstetrics and Gynecology

## 2021-01-18 DIAGNOSIS — E611 Iron deficiency: Secondary | ICD-10-CM

## 2021-01-19 ENCOUNTER — Emergency Department
Admission: EM | Admit: 2021-01-19 | Discharge: 2021-01-19 | Disposition: A | Discharge status: Home / Self Care | Payer: 59 | Source: Home / Self Care | Attending: Family Medicine | Admitting: Family Medicine

## 2021-01-19 ENCOUNTER — Other Ambulatory Visit: Payer: Self-pay

## 2021-01-19 ENCOUNTER — Emergency Department (INDEPENDENT_AMBULATORY_CARE_PROVIDER_SITE_OTHER): Payer: 59

## 2021-01-19 ENCOUNTER — Inpatient Hospital Stay: Payer: 59 | Admitting: Physician Assistant

## 2021-01-19 ENCOUNTER — Inpatient Hospital Stay: Payer: 59

## 2021-01-19 ENCOUNTER — Encounter: Payer: Self-pay | Admitting: Emergency Medicine

## 2021-01-19 ENCOUNTER — Telehealth: Payer: Self-pay

## 2021-01-19 DIAGNOSIS — J111 Influenza due to unidentified influenza virus with other respiratory manifestations: Secondary | ICD-10-CM

## 2021-01-19 DIAGNOSIS — J029 Acute pharyngitis, unspecified: Secondary | ICD-10-CM

## 2021-01-19 DIAGNOSIS — R059 Cough, unspecified: Secondary | ICD-10-CM

## 2021-01-19 LAB — POCT RAPID STREP A (OFFICE): Rapid Strep A Screen: NEGATIVE

## 2021-01-19 LAB — POCT INFLUENZA A/B
Influenza A, POC: POSITIVE — AB
Influenza B, POC: NEGATIVE

## 2021-01-19 MED ORDER — ACETAMINOPHEN 325 MG PO TABS
650.0000 mg | ORAL_TABLET | Freq: Once | ORAL | Status: AC
  Administered 2021-01-19: 650 mg via ORAL

## 2021-01-19 MED ORDER — PROMETHAZINE-DM 6.25-15 MG/5ML PO SYRP: 5.0000 mL | ORAL_SOLUTION | Freq: Four times a day (QID) | ORAL | 0 refills | Status: DC | PRN

## 2021-01-19 MED ORDER — OSELTAMIVIR PHOSPHATE 75 MG PO CAPS: 75.0000 mg | ORAL_CAPSULE | Freq: Two times a day (BID) | ORAL | 0 refills | Status: DC

## 2021-01-19 NOTE — ED Triage Notes (Addendum)
Flu exposure  Son tested positive for flu a this morning  Cough, congestion & fever since last night  Alternating tylenol & ibuprofen - tylenol at 1100 Finished 2nd round of antibiotics

## 2021-01-19 NOTE — Progress Notes (Signed)
Pt calling dr Gaetano Net to cancel surgery has flu

## 2021-01-19 NOTE — Discharge Instructions (Signed)
Take the tamiflu 2 x a day for 5 days Take OTC cough and cold medications as needed Drink lots of fluids Try promethazine DM for the cough Call if this does not work Off until [employer]

## 2021-01-19 NOTE — Telephone Encounter (Signed)
Nurse Assessment Nurse: Toribio Harbour, RN, Joelene Millin Date/Time (Eastern Time): 01/19/2021 11:26:16 AM Confirm and document reason for call. If symptomatic, describe symptoms. ---Caller was diagnosed with Flu B the week before Thanksgiving and the cough has remained. She has a fever as 105 (ear) last night and it is now 102.7, cough, chills, and body aches. Does the patient have any new or worsening symptoms? ---Yes Will a triage be completed? ---Yes Related visit to physician within the last 2 weeks? ---Yes Does the PT have any chronic conditions? (i.e. diabetes, asthma, this includes High risk factors for pregnancy, etc.) ---No Is the patient pregnant or possibly pregnant? (Ask all females between the ages of 56-55) ---No Is this a behavioral health or substance abuse call? ---No Guidelines Guideline Title Affirmed Question Affirmed Notes Nurse Date/Time (Eastern Time) Influenza Follow-up Call Fever > 104 F (40 C) Daves, RN, Joelene Millin 01/19/2021 11:29:53 AM Disp. Time Eilene Ghazi Time) Disposition Final User 01/19/2021 10:19:53 AM Send To Nurse Lennox Laity, RN, Olin Hauser 01/19/2021 10:20:15 AM Send To Nurse Lennox Laity, RN, Olin Hauser 01/19/2021 11:32:55 AM Go to ED Now (or PCP triage) Yes Toribio Harbour, RN, Joelene Millin PLEASE NOTE: All timestamps contained within this report are represented as [place] Standard Time. CONFIDENTIALTY NOTICE: This fax transmission is intended only for the addressee. It contains information that is legally privileged, confidential or otherwise protected from use or disclosure. If you are not the intended recipient, you are strictly prohibited from reviewing, disclosing, copying using or disseminating any of this information or taking any action in reliance on or regarding this information. If you have received this fax in error, please notify us immediately by telephone so that we can arrange for its return to [place]. Phone: [phone], Toll-Free: [phone], Fax:  [phone] Page: 2 of 2 Call Id: [id number] [place] Disagree/Comply Comply Caller Understands Yes PreDisposition Call Doctor Care Advice Given Per Guideline GO TO ED NOW (OR PCP TRIAGE): * IF NO PCP (PRIMARY CARE PROVIDER) SECOND-LEVEL TRIAGE: You need to be seen within the next hour. Go to the [facility] at _____________ [place] as soon as you can. NO ASPIRIN: * Do not use aspirin for treatment of fever or pain. * Reason: there is an association between influenza and Arvin Collard' Syndrome. CARE ADVICE given per Influenza Follow-Up Call (Adult) guideline. Referrals  Urgent Care at [facility] - UC   Pt at urgent care

## 2021-01-19 NOTE — ED Provider Notes (Signed)
[facility]    CSN: [id number] Arrival date & time: 01/19/21  1244      History   Chief Complaint Chief Complaint  Patient presents with   Fever    HPI [name] is a 45 y.o. female.   HPI  Patient was tested positive for strep.  Took antibiotics but had a sore throat immediately after.  Had a second strep test that was positive again.  Finished second round of antibiotics.  When she came in for her second visit, she was found to have influenza B.  Now she feels sick again with fever and body aches.  Today's testing reveals that she is negative for strep but positive for influenza A.  Patient has iron deficiency anemia from menorrhagia and is scheduled to have a hysterectomy next week.  Unfortunately this is going to have to be rescheduled.  She does not have any immune deficiencies.  She states that the cough is never gone away from her first influenza.  She states that she is taking every cough medicine she can try over-the-counter.  She has been using an albuterol inhaler without improvement.  She does feel short of breath  Past Medical History:  Diagnosis Date   Abnormal EKG    worked up  with  echo 04-02-2019,sr decreasing r wave progression   Anemia    last iron infusion 12-22-2020   Anxiety    Chicken pox    as child   Depression    Flu    Headache(784.0)    History of COVID-19 02/19/2019   IBS (irritable bowel syndrome)    Migraines 11/01/2020   none recent   Placenta previa 02/20/2010   with current pregnancy-type and cross 2 Units per protocol   PONV (postoperative nausea and vomiting)    likes scopolamine patch   Psoriasis 11/01/2020   Strep throat    Syringomyelia, acquired ([place])    Wears glasses 11/01/2020    Patient Active Problem List   Diagnosis Date Noted   Iron deficiency 11/17/2020   At moderate risk for venous thromboembolism (VTE) 11/10/2020   Closed nondisplaced fracture of navicular bone of right foot 11/04/2018   IDA  (iron deficiency anemia) 06/26/2017   Iron deficiency anemia due to chronic blood loss 06/26/2017   B12 deficiency 03/30/2017   Asthma 12/08/2016   Scalp lesion 01/01/2014   Allergic rhinitis 11/11/2013   Daytime somnolence 09/04/2013   Syringomyelia ([place]) 05/09/2013   Mass 05/09/2013   Paresthesia 07/19/2012   Depression 07/19/2012   Sensory disturbance 06/27/2012   Leg weakness 06/27/2012   Leukocytosis 06/27/2012   Microcytic anemia 06/27/2012    Past Surgical History:  Procedure Laterality Date   APPENDECTOMY  02/21/2003   laparoscopic   arthroscopic knee Left 02/21/1988   CESAREAN SECTION  12/27/2010   Procedure: CESAREAN SECTION;  Surgeon: Shon Millet II;  Location: [facility];  Service: Gynecology;  Laterality: N/A;   CESAREAN SECTION N/A 02/03/2015   Procedure: CESAREAN SECTION;  Surgeon: Everlene Farrier, MD;  Location: [facility];  Service: Obstetrics;  Laterality: N/A;   left breast reduction  02/20/1994   TUBAL LIGATION Bilateral 02/03/2015   Procedure: BILATERAL TUBAL LIGATION;  Surgeon: Everlene Farrier, MD;  Location: [facility];  Service: Obstetrics;  Laterality: Bilateral;   WISDOM TOOTH EXTRACTION  2001    OB History     Gravida  3   Para  3   Term  2   Preterm  1   AB  Living  3      SAB      IAB      Ectopic      Multiple  0   Live Births  3            Home Medications    Prior to Admission medications   Medication Sig Start Date End Date Taking? Authorizing Provider  oseltamivir (TAMIFLU) 75 MG capsule Take 1 capsule (75 mg total) by mouth every 12 (twelve) hours. 01/19/21  Yes Raylene Everts, MD  promethazine-dextromethorphan (PROMETHAZINE-DM) 6.25-15 MG/5ML syrup Take 5 mLs by mouth 4 (four) times daily as needed for cough. 01/19/21  Yes Raylene Everts, MD  albuterol (VENTOLIN HFA) 108 (90 Base) MCG/ACT inhaler Inhale 2 puffs by mouth into the lungs every 6 (six) hours as needed for wheezing or shortness of breath. 01/07/21    Copland, Gay Filler, MD  cephALEXin (KEFLEX) 500 MG capsule Take 1 capsule (500 mg total) by mouth 2 (two) times daily. Patient not taking: Reported on 01/19/2021 01/05/21   Raylene Everts, MD  enoxaparin (LOVENOX) 40 MG/0.4ML injection Inject 0.4 mLs (40 mg total) into the skin daily. 11/10/20   Lincoln Brigham, PA-C  Ferrous Gluconate-C-Folic Acid (IRON-C PO) Take by mouth daily.    [provider]  ibuprofen (ADVIL,MOTRIN) 600 MG tablet Take 1 tablet (600 mg total) by mouth every 6 (six) hours as needed. 02/06/15   Molli Posey, MD  Multiple Vitamin (MULTIVITAMIN) tablet Take 1 tablet by mouth daily.    [provider]  OVER THE COUNTER MEDICATION Omega 3 fish oil 2 caps bid    [provider]  UNABLE TO FIND Vitamin b 1000 mg daily    [provider]  VITAMIN D PO Take 5,000 Units by mouth daily.    [provider]    Family History Family History  Problem Relation Age of Onset   High blood pressure Mother    Arthritis Mother    Hypertension Mother    Other Mother        "Multisystem atrophy"   Clotting disorder Mother    Irritable bowel syndrome Mother    Alcohol abuse Father    Arthritis Father    Colon polyps Father    High blood pressure Brother    Prostate cancer Maternal Grandfather    Colon cancer Maternal Uncle    Stomach cancer Neg Hx     Social History Social History   Tobacco Use   Smoking status: Never   Smokeless tobacco: Never  Vaping Use   Vaping Use: Never used  Substance Use Topics   Alcohol use: No    Comment: rarely   Drug use: No     Allergies   Topamax [topiramate]   Review of Systems Review of Systems See HPI  Physical Exam Triage Vital Signs ED Triage Vitals  Enc Vitals Group     BP 01/19/21 1438 120/81     Pulse Rate 01/19/21 1438 94     Resp 01/19/21 1438 18     Temp 01/19/21 1438 99.2 F (37.3 C)     Temp Source 01/19/21 1438 Oral     SpO2 01/19/21 1438 100 %     Weight  01/19/21 1439 217 lb (98.4 kg)     Height 01/19/21 1439 5\' 8"  (1.727 m)     Head Circumference --      Peak Flow --      Pain Score 01/19/21 1439 4  Pain Loc --      Pain Edu? --      Excl. in [place]? --    No data found.  Updated Vital Signs BP 120/81 (BP Location: Right Arm)   Pulse 94   Temp 99.2 F (37.3 C) (Oral)   Resp 18   Ht 5\' 8"  (1.727 m)   Wt 98.4 kg   SpO2 100%   BMI 32.99 kg/m     Physical Exam Constitutional:      General: She is not in acute distress.    Appearance: She is well-developed. She is ill-appearing.  HENT:     Head: Normocephalic and atraumatic.     Right Ear: Tympanic membrane and ear canal normal.     Left Ear: Tympanic membrane and ear canal normal.     Mouth/Throat:     Mouth: Mucous membranes are moist.     Pharynx: Posterior oropharyngeal erythema present.  Eyes:     Conjunctiva/sclera: Conjunctivae normal.     Pupils: Pupils are equal, round, and reactive to light.  Cardiovascular:     Rate and Rhythm: Normal rate and regular rhythm.     Heart sounds: Normal heart sounds.  Pulmonary:     Effort: Pulmonary effort is normal. No respiratory distress.     Breath sounds: Normal breath sounds.  Abdominal:     General: There is no distension.     Palpations: Abdomen is soft.  Musculoskeletal:        General: Normal range of motion.     Cervical back: Normal range of motion.  Lymphadenopathy:     Cervical: No cervical adenopathy.  Skin:    General: Skin is warm and dry.  Neurological:     Mental Status: She is alert.  Psychiatric:        Behavior: Behavior normal.     UC Treatments / Results  Labs (all labs ordered are listed, but only abnormal results are displayed) Labs Reviewed  POCT INFLUENZA A/B - Abnormal; Notable for the following components:      Result Value   Influenza A, POC Positive (*)    All other components within normal limits  CULTURE, GROUP A STREP  POCT RAPID STREP A (OFFICE)    EKG   Radiology DG  Chest 2 View  Result Date: 01/19/2021 CLINICAL DATA:  Cough. EXAM: CHEST - 2 VIEW COMPARISON:  03/20/2019. FINDINGS: Trachea is midline. Heart size normal. Lungs are clear. No pleural fluid. IMPRESSION: No acute findings. Electronically Signed   By: Lorin Picket M.D.   On: 01/19/2021 15:44    Procedures Procedures (including critical care time)  Medications Ordered in UC Medications  acetaminophen (TYLENOL) tablet 650 mg (650 mg Oral Given 01/19/21 1603)    Initial Impression / Assessment and Plan / UC Course  I have reviewed the triage vital signs and the nursing notes.  Pertinent labs & imaging results that were available during my care of the patient were reviewed by me and considered in my medical decision making (see chart for details).     Patient has had an unfortunate month.  2 episodes of strep.  Influenza B and now influenza A.  I will prescribe Tamiflu.  She looks moderately ill.  Discussed home care.  Call for problems Final Clinical Impressions(s) / UC Diagnoses   Final diagnoses:  Acute pharyngitis, unspecified etiology  Influenza     Discharge Instructions      Take the tamiflu 2 x a day for 5 days  Take OTC cough and cold medications as needed Drink lots of fluids Try promethazine DM for the cough Call if this does not work Off until [employer]     ED Prescriptions     Medication Sig Dispense Auth. Provider   oseltamivir (TAMIFLU) 75 MG capsule Take 1 capsule (75 mg total) by mouth every 12 (twelve) hours. 10 capsule Raylene Everts, MD   promethazine-dextromethorphan (PROMETHAZINE-DM) 6.25-15 MG/5ML syrup Take 5 mLs by mouth 4 (four) times daily as needed for cough. 240 mL Raylene Everts, MD      PDMP not reviewed this encounter.   Raylene Everts, MD 01/19/21 [phone]

## 2021-01-20 ENCOUNTER — Inpatient Hospital Stay (HOSPITAL_COMMUNITY): Admission: RE | Admit: 2021-01-20 | Payer: 59 | Source: Ambulatory Visit

## 2021-01-20 NOTE — Progress Notes (Signed)
Left message with mary at dr [facility] office, pt in urgent care yesterday with positive influenza test and sob with no relief from albuterol inhaler, pt needs to be rescheduled for surgery in 4 to 6 weeks per anesthesia.

## 2021-01-23 ENCOUNTER — Telehealth: Payer: 59 | Admitting: Family

## 2021-01-23 DIAGNOSIS — R399 Unspecified symptoms and signs involving the genitourinary system: Secondary | ICD-10-CM | POA: Diagnosis not present

## 2021-01-23 LAB — CULTURE, GROUP A STREP: Strep A Culture: NEGATIVE

## 2021-01-23 MED ORDER — CEPHALEXIN 500 MG PO CAPS: 500.0000 mg | ORAL_CAPSULE | Freq: Two times a day (BID) | ORAL | 0 refills | Status: DC

## 2021-01-23 NOTE — Progress Notes (Signed)

## 2021-01-24 ENCOUNTER — Ambulatory Visit (HOSPITAL_BASED_OUTPATIENT_CLINIC_OR_DEPARTMENT_OTHER): Admission: RE | Admit: 2021-01-24 | Payer: 59 | Source: Home / Self Care | Admitting: Obstetrics and Gynecology

## 2021-01-24 DIAGNOSIS — N921 Excessive and frequent menstruation with irregular cycle: Secondary | ICD-10-CM

## 2021-01-24 HISTORY — DX: Streptococcal pharyngitis: J02.0

## 2021-01-24 HISTORY — DX: Influenza due to unidentified influenza virus with other respiratory manifestations: J11.1

## 2021-01-24 SURGERY — HYSTERECTOMY, VAGINAL, LAPAROSCOPY-ASSISTED, WITH SALPINGECTOMY
Anesthesia: Choice | Laterality: Right

## 2021-03-22 ENCOUNTER — Telehealth: Payer: 59 | Admitting: Family Medicine

## 2021-03-22 DIAGNOSIS — J329 Chronic sinusitis, unspecified: Secondary | ICD-10-CM

## 2021-03-22 MED ORDER — AMOXICILLIN-POT CLAVULANATE 875-125 MG PO TABS: 1.0000 | ORAL_TABLET | Freq: Two times a day (BID) | ORAL | 0 refills | Status: AC

## 2021-03-22 NOTE — Progress Notes (Signed)

## 2021-05-10 DIAGNOSIS — H53021 Refractive amblyopia, right eye: Secondary | ICD-10-CM | POA: Diagnosis not present

## 2021-05-10 DIAGNOSIS — H47093 Other disorders of optic nerve, not elsewhere classified, bilateral: Secondary | ICD-10-CM | POA: Diagnosis not present

## 2021-05-10 DIAGNOSIS — T7840XS Allergy, unspecified, sequela: Secondary | ICD-10-CM | POA: Diagnosis not present

## 2021-08-09 ENCOUNTER — Encounter (HOSPITAL_BASED_OUTPATIENT_CLINIC_OR_DEPARTMENT_OTHER): Payer: Self-pay

## 2021-08-09 ENCOUNTER — Emergency Department (HOSPITAL_BASED_OUTPATIENT_CLINIC_OR_DEPARTMENT_OTHER)
Admission: EM | Admit: 2021-08-09 | Discharge: 2021-08-09 | Disposition: A | Discharge status: Home / Self Care | Payer: 59 | Attending: Emergency Medicine | Admitting: Emergency Medicine

## 2021-08-09 ENCOUNTER — Other Ambulatory Visit: Payer: Self-pay

## 2021-08-09 DIAGNOSIS — R5383 Other fatigue: Secondary | ICD-10-CM | POA: Insufficient documentation

## 2021-08-09 DIAGNOSIS — D72829 Elevated white blood cell count, unspecified: Secondary | ICD-10-CM | POA: Insufficient documentation

## 2021-08-09 DIAGNOSIS — R8289 Other abnormal findings on cytological and histological examination of urine: Secondary | ICD-10-CM | POA: Insufficient documentation

## 2021-08-09 DIAGNOSIS — N939 Abnormal uterine and vaginal bleeding, unspecified: Secondary | ICD-10-CM | POA: Insufficient documentation

## 2021-08-09 DIAGNOSIS — R509 Fever, unspecified: Secondary | ICD-10-CM | POA: Diagnosis not present

## 2021-08-09 DIAGNOSIS — J00 Acute nasopharyngitis [common cold]: Secondary | ICD-10-CM | POA: Diagnosis not present

## 2021-08-09 LAB — URINALYSIS, MICROSCOPIC (REFLEX)

## 2021-08-09 LAB — CBC
HCT: 40 % (ref 36.0–46.0)
Hemoglobin: 13.3 g/dL (ref 12.0–15.0)
MCH: 28.9 pg (ref 26.0–34.0)
MCHC: 33.3 g/dL (ref 30.0–36.0)
MCV: 87 fL (ref 80.0–100.0)
Platelets: 339 10*3/uL (ref 150–400)
RBC: 4.6 MIL/uL (ref 3.87–5.11)
RDW: 11.9 % (ref 11.5–15.5)
WBC: 12 10*3/uL — ABNORMAL HIGH (ref 4.0–10.5)
nRBC: 0 % (ref 0.0–0.2)

## 2021-08-09 LAB — COMPREHENSIVE METABOLIC PANEL
ALT: 21 U/L (ref 0–44)
AST: 20 U/L (ref 15–41)
Albumin: 3.9 g/dL (ref 3.5–5.0)
Alkaline Phosphatase: 70 U/L (ref 38–126)
Anion gap: 5 (ref 5–15)
BUN: 10 mg/dL (ref 6–20)
CO2: 26 mmol/L (ref 22–32)
Calcium: 9.2 mg/dL (ref 8.9–10.3)
Chloride: 106 mmol/L (ref 98–111)
Creatinine, Ser: 0.76 mg/dL (ref 0.44–1.00)
GFR, Estimated: >60 mL/min (ref >60)
Glucose, Bld: 94 mg/dL (ref 70–99)
Potassium: 4 mmol/L (ref 3.5–5.1)
Sodium: 137 mmol/L (ref 135–145)
Total Bilirubin: 0.5 mg/dL (ref 0.3–1.2)
Total Protein: 7.2 g/dL (ref 6.5–8.1)

## 2021-08-09 LAB — URINALYSIS, ROUTINE W REFLEX MICROSCOPIC
Bilirubin Urine: NEGATIVE
Glucose, UA: NEGATIVE mg/dL
Ketones, ur: NEGATIVE mg/dL
Leukocytes,Ua: NEGATIVE
Nitrite: NEGATIVE
Protein, ur: NEGATIVE mg/dL
Specific Gravity, Urine: 1.015 (ref 1.005–1.030)
pH: 7 (ref 5.0–8.0)

## 2021-08-09 LAB — LIPASE, BLOOD: Lipase: 40 U/L (ref 11–51)

## 2021-08-09 LAB — PREGNANCY, URINE: Preg Test, Ur: NEGATIVE

## 2021-08-09 MED ORDER — NAPROXEN 375 MG PO TABS
375.0000 mg | ORAL_TABLET | Freq: Two times a day (BID) | ORAL | 0 refills | Status: DC
Start: 2021-08-09 — End: 2021-08-09
  Filled 2021-08-09: qty 20, 10d supply, fill #0

## 2021-08-09 MED ORDER — SODIUM CHLORIDE 0.9 % IV BOLUS (SEPSIS)
1000.0000 mL | Freq: Once | INTRAVENOUS | Status: AC
Start: 2021-08-09 — End: 2021-08-09
  Administered 2021-08-09: 1000 mL via INTRAVENOUS

## 2021-08-09 MED ORDER — KETOROLAC TROMETHAMINE 30 MG/ML IJ SOLN
30.0000 mg | Freq: Once | INTRAMUSCULAR | Status: AC
Start: 2021-08-09 — End: 2021-08-09
  Administered 2021-08-09: 30 mg via INTRAVENOUS
  Filled 2021-08-09: qty 1

## 2021-08-09 MED ORDER — NAPROXEN 375 MG PO TABS: 375.0000 mg | ORAL_TABLET | Freq: Two times a day (BID) | ORAL | 0 refills | Status: DC

## 2021-08-09 MED ORDER — SODIUM CHLORIDE 0.9 % IV SOLN: 1000.0000 mL | INTRAVENOUS | Status: DC

## 2021-08-09 NOTE — ED Triage Notes (Addendum)
Pt has h/x heavy periods and was scheduled for a hysterectomy but it got cancelled. Pt states that she started her cycle Sunday and has been going through 1 pad and 1 super plus tampon per hour. Pt also endorses back, pelvic, and abd pain. Pt also reports that she is cold and fatigued.

## 2021-08-09 NOTE — ED Notes (Signed)
Patient verbalizes understanding of discharge instructions. Opportunity for questioning and answers were provided. Armband removed by staff, pt discharged from ED. Ambulated out to lobby  

## 2021-08-09 NOTE — ED Provider Notes (Signed)
[facility] Provider Note   CSN: [id number] Arrival date & time: 08/09/21  1739     History  Chief Complaint  Patient presents with   Abdominal Pain   Vaginal Bleeding    [name] is a 46 y.o. female.   Abdominal Pain Associated symptoms: fever and vaginal bleeding   Vaginal Bleeding Associated symptoms: abdominal pain and fever     Patient presents to the ED with complaints of menstrual cramping and heavy menses.  Patient states she has a history of irregular and heavy menstrual periods she previously see an OB/GYN doctor.  She was post to have a hysterectomy last year but it was canceled initially by her doctor.  Then patient had the fluid and was canceled.  Patient has not had the procedure since that time.  Patient states she continues to have irregular periods.  Today it was very heavy and she has been going through about a pad an hour.  Patient states she has had some lower abdominal pain that goes to her back.  She is not having any fevers or chills but she felt cold and fatigued today.  She was concerned about her blood count so she came to the ED  Home Medications Prior to Admission medications   Medication Sig Start Date End Date Taking? Authorizing Provider  naproxen (NAPROSYN) 375 MG tablet Take 1 tablet (375 mg total) by mouth 2 (two) times daily. 08/09/21  Yes Dorie Rank, MD  albuterol (VENTOLIN HFA) 108 (90 Base) MCG/ACT inhaler Inhale 2 puffs by mouth into the lungs every 6 (six) hours as needed for wheezing or shortness of breath. 01/07/21   Copland, Gay Filler, MD  enoxaparin (LOVENOX) 40 MG/0.4ML injection Inject 0.4 mLs (40 mg total) into the skin daily. 11/10/20   Lincoln Brigham, PA-C  Ferrous Gluconate-C-Folic Acid (IRON-C PO) Take by mouth daily.    [provider]  ibuprofen (ADVIL,MOTRIN) 600 MG tablet Take 1 tablet (600 mg total) by mouth every 6 (six) hours as needed. 02/06/15   Molli Posey, MD  Multiple  Vitamin (MULTIVITAMIN) tablet Take 1 tablet by mouth daily.    [provider]  oseltamivir (TAMIFLU) 75 MG capsule Take 1 capsule (75 mg total) by mouth every 12 (twelve) hours. 01/19/21   Raylene Everts, MD  OVER THE COUNTER MEDICATION Omega 3 fish oil 2 caps bid    [provider]  promethazine-dextromethorphan (PROMETHAZINE-DM) 6.25-15 MG/5ML syrup Take 5 mLs by mouth 4 (four) times daily as needed for cough. 01/19/21   Raylene Everts, MD  UNABLE TO FIND Vitamin b 1000 mg daily    [provider]  VITAMIN D PO Take 5,000 Units by mouth daily.    [provider]      Allergies    Topamax [topiramate]    Review of Systems   Review of Systems  Constitutional:  Positive for fever.  Gastrointestinal:  Positive for abdominal pain.  Genitourinary:  Positive for vaginal bleeding.    Physical Exam Updated Vital Signs BP 133/78   Pulse 71   Temp 98.4 F (36.9 C) (Oral)   Resp 15   Ht 1.727 m ('5\' 8"'$ )   Wt 98.4 kg   LMP 08/07/2021 (Exact Date)   SpO2 100%   BMI 32.99 kg/m  Physical Exam Vitals and nursing note reviewed.  Constitutional:      General: She is not in acute distress.    Appearance: She is well-developed.  HENT:  Head: Normocephalic and atraumatic.     Right Ear: External ear normal.     Left Ear: External ear normal.  Eyes:     General: No scleral icterus.       Right eye: No discharge.        Left eye: No discharge.     Conjunctiva/sclera: Conjunctivae normal.  Neck:     Trachea: No tracheal deviation.  Cardiovascular:     Rate and Rhythm: Normal rate and regular rhythm.  Pulmonary:     Effort: Pulmonary effort is normal. No respiratory distress.     Breath sounds: Normal breath sounds. No stridor. No wheezing or rales.  Abdominal:     General: Bowel sounds are normal. There is no distension.     Palpations: Abdomen is soft.     Tenderness: There is no abdominal tenderness. There is no guarding or rebound.   Genitourinary:    Comments: Small amount of blood noted in the vaginal vault, no cervical motion tenderness, no adnexal or uterine tenderness Musculoskeletal:        General: No tenderness or deformity.     Cervical back: Neck supple.  Skin:    General: Skin is warm and dry.     Findings: No rash.  Neurological:     General: No focal deficit present.     Mental Status: She is alert.     Cranial Nerves: No cranial nerve deficit (no facial droop, extraocular movements intact, no slurred speech).     Sensory: No sensory deficit.     Motor: No abnormal muscle tone or seizure activity.     Coordination: Coordination normal.  Psychiatric:        Mood and Affect: Mood normal.     ED Results / Procedures / Treatments   Labs (all labs ordered are listed, but only abnormal results are displayed) Labs Reviewed  CBC - Abnormal; Notable for the following components:      Result Value   WBC 12.0 (*)    All other components within normal limits  URINALYSIS, ROUTINE W REFLEX MICROSCOPIC - Abnormal; Notable for the following components:   Hgb urine dipstick MODERATE (*)    All other components within normal limits  URINALYSIS, MICROSCOPIC (REFLEX) - Abnormal; Notable for the following components:   Bacteria, UA RARE (*)    All other components within normal limits  LIPASE, BLOOD  COMPREHENSIVE METABOLIC PANEL  PREGNANCY, URINE    EKG None  Radiology No results found.  Procedures Procedures    Medications Ordered in ED Medications  sodium chloride 0.9 % bolus 1,000 mL (0 mLs Intravenous Stopped 08/09/21 2002)    Followed by  0.9 %  sodium chloride infusion (has no administration in time range)  ketorolac (TORADOL) 30 MG/ML injection 30 mg (30 mg Intravenous Given 08/09/21 1857)    ED Course/ Medical Decision Making/ A&P Clinical Course as of 08/09/21 2024  Tue Aug 09, 2021  1938 Comprehensive metabolic panel Metabolic panel normal [JK]  1938 Urinalysis, Microscopic  (reflex)(!) Urinalysis noted for blood [JK]  1938 Pregnancy, urine Pregnancy test negative [JK]  1938 CBC(!) White blood cell count slightly elevated at 12.  Hemoglobin stable at 13.3 [JK]    Clinical Course User Index [JK] Dorie Rank, MD                           Medical Decision Making Problems Addressed: Vaginal bleeding: acute illness or injury that poses a  threat to life or bodily functions  Amount and/or Complexity of Data Reviewed Labs: ordered. Decision-making details documented in ED Course.  Risk Prescription drug management. Risk Details: Patient presented to the ED for evaluation of recurrent vaginal bleeding.  Patient has history of this in the past.  She states she is scheduled to have hysterectomy but it ended up getting canceled.  Patient fortunately does not show any signs of acute anemia at this time.  Bleeding seems to have decreased now on pelvic exam.  Patient was given a dose of Toradol for pain and discomfort and she is feeling much better now.  At this time she appears stable for discharge.  Recommend close outpatient follow-up with her OB/GYN.   Evaluation and diagnostic testing in the emergency department does not suggest an emergent condition requiring admission or immediate intervention beyond what has been performed at this time.  The patient is safe for discharge and has been instructed to return immediately for worsening symptoms, change in symptoms or any other concerns.         Final Clinical Impression(s) / ED Diagnoses Final diagnoses:  Vaginal bleeding    Rx / DC Orders ED Discharge Orders          Ordered    naproxen (NAPROSYN) 375 MG tablet  2 times daily        08/09/21 2022              Dorie Rank, MD 08/09/21 2024

## 2021-08-09 NOTE — Discharge Instructions (Signed)
Take the medications as needed for pain and discomfort.  Consider taking over-the-counter iron tablets to help prevent anemia.  Follow-up with your OB/GYN doctor as we discussed

## 2021-08-10 ENCOUNTER — Other Ambulatory Visit (HOSPITAL_BASED_OUTPATIENT_CLINIC_OR_DEPARTMENT_OTHER): Payer: Self-pay

## 2021-08-15 DIAGNOSIS — Z6834 Body mass index (BMI) 34.0-34.9, adult: Secondary | ICD-10-CM | POA: Diagnosis not present

## 2021-08-15 DIAGNOSIS — Z1231 Encounter for screening mammogram for malignant neoplasm of breast: Secondary | ICD-10-CM | POA: Diagnosis not present

## 2021-08-15 DIAGNOSIS — Z01419 Encounter for gynecological examination (general) (routine) without abnormal findings: Secondary | ICD-10-CM | POA: Diagnosis not present

## 2021-11-18 ENCOUNTER — Ambulatory Visit
Admission: RE | Admit: 2021-11-18 | Discharge: 2021-11-18 | Disposition: A | Discharge status: Home / Self Care | Payer: 59 | Source: Ambulatory Visit | Attending: Physician Assistant | Admitting: Physician Assistant

## 2021-11-18 VITALS — BP 150/87 | HR 81 | Temp 98.0°F | Resp 16

## 2021-11-18 DIAGNOSIS — R519 Headache, unspecified: Secondary | ICD-10-CM

## 2021-11-18 DIAGNOSIS — I10 Essential (primary) hypertension: Secondary | ICD-10-CM | POA: Diagnosis not present

## 2021-11-18 MED ORDER — HYDROCHLOROTHIAZIDE 12.5 MG PO TABS: 25.0000 mg | ORAL_TABLET | Freq: Every day | ORAL | 1 refills | Status: DC

## 2021-11-18 NOTE — Discharge Instructions (Addendum)
See your Physician for recheck.  Return if any problems.  °

## 2021-11-18 NOTE — ED Triage Notes (Signed)
Pt states yesterday she started having headache, dizzy and noticed her BP was elevated (170s/80s) at pharmacy. States today she is still feeling lightheaded today.

## 2021-11-18 NOTE — ED Provider Notes (Signed)
[facility]    CSN: [id number] Arrival date & time: 11/18/21  1050      History   Chief Complaint Chief Complaint  Patient presents with  . Hypertension    Past 2 days my BP has been elevated (no history of HTN), headache, dizziness, heart racing feeling - Entered by patient    HPI [name] is a 46 y.o. female.   Pt reports yesterday she developed a headache yesterday.  Pt noticed some chest discomfort.    The history is provided by the patient. No language interpreter was used.  Hypertension This is a new problem. The current episode started yesterday. The problem occurs constantly. The problem has not changed since onset.Associated symptoms include chest pain and headaches. Nothing aggravates the symptoms. Nothing relieves the symptoms. She has tried nothing for the symptoms. The treatment provided no relief.    Past Medical History:  Diagnosis Date  . Abnormal EKG    worked up  with  echo 04-02-2019,sr decreasing r wave progression  . Anemia    last iron infusion 12-22-2020  . Anxiety   . Chicken pox    as child  . Depression   . Flu   . Headache(784.0)   . History of COVID-19 02/19/2019  . IBS (irritable bowel syndrome)   . Migraines 11/01/2020   none recent  . Placenta previa 02/20/2010   with current pregnancy-type and cross 2 Units per protocol  . PONV (postoperative nausea and vomiting)    likes scopolamine patch  . Psoriasis 11/01/2020  . Strep throat   . Syringomyelia, acquired ([place])   . Wears glasses 11/01/2020    Patient Active Problem List   Diagnosis Date Noted  . Iron deficiency 11/17/2020  . At moderate risk for venous thromboembolism (VTE) 11/10/2020  . Closed nondisplaced fracture of navicular bone of right foot 11/04/2018  . IDA (iron deficiency anemia) 06/26/2017  . Iron deficiency anemia due to chronic blood loss 06/26/2017  . B12 deficiency 03/30/2017  . Asthma 12/08/2016  . Scalp lesion 01/01/2014  . Allergic  rhinitis 11/11/2013  . Daytime somnolence 09/04/2013  . Syringomyelia ([place]) 05/09/2013  . Mass 05/09/2013  . Paresthesia 07/19/2012  . Depression 07/19/2012  . Sensory disturbance 06/27/2012  . Leg weakness 06/27/2012  . Leukocytosis 06/27/2012  . Microcytic anemia 06/27/2012    Past Surgical History:  Procedure Laterality Date  . APPENDECTOMY  02/21/2003   laparoscopic  . arthroscopic knee Left 02/21/1988  . CESAREAN SECTION  12/27/2010   Procedure: CESAREAN SECTION;  Surgeon: Shon Millet II;  Location: [facility];  Service: Gynecology;  Laterality: N/A;  . CESAREAN SECTION N/A 02/03/2015   Procedure: CESAREAN SECTION;  Surgeon: Everlene Farrier, MD;  Location: [facility];  Service: Obstetrics;  Laterality: N/A;  . left breast reduction  02/20/1994  . TUBAL LIGATION Bilateral 02/03/2015   Procedure: BILATERAL TUBAL LIGATION;  Surgeon: Everlene Farrier, MD;  Location: [facility];  Service: Obstetrics;  Laterality: Bilateral;  . WISDOM TOOTH EXTRACTION  2001    OB History     Gravida  3   Para  3   Term  2   Preterm  1   AB      Living  3      SAB      IAB      Ectopic      Multiple  0   Live Births  3            Home  Medications    Prior to Admission medications   Medication Sig Start Date End Date Taking? Authorizing Provider  hydrochlorothiazide (HYDRODIURIL) 12.5 MG tablet Take 2 tablets (25 mg total) by mouth daily. 11/18/21  Yes Caryl Ada K, PA-C  albuterol (VENTOLIN HFA) 108 (90 Base) MCG/ACT inhaler Inhale 2 puffs by mouth into the lungs every 6 (six) hours as needed for wheezing or shortness of breath. 01/07/21   Copland, Gay Filler, MD  Ferrous Gluconate-C-Folic Acid (IRON-C PO) Take by mouth daily.    [provider]  ibuprofen (ADVIL,MOTRIN) 600 MG tablet Take 1 tablet (600 mg total) by mouth every 6 (six) hours as needed. 02/06/15   Molli Posey, MD  Multiple Vitamin (MULTIVITAMIN) tablet Take 1 tablet by mouth daily.    [provider]  naproxen (NAPROSYN) 375 MG tablet Take 1 tablet (375 mg total) by mouth 2 (two) times daily. 08/09/21   Dorie Rank, MD  OVER THE COUNTER MEDICATION Omega 3 fish oil 2 caps bid    [provider]  UNABLE TO FIND Vitamin b 1000 mg daily    [provider]  VITAMIN D PO Take 5,000 Units by mouth daily.    [provider]    Family History Family History  Problem Relation Age of Onset  . High blood pressure Mother   . Arthritis Mother   . Hypertension Mother   . Other Mother        "Multisystem atrophy"  . Clotting disorder Mother   . Irritable bowel syndrome Mother   . Alcohol abuse Father   . Arthritis Father   . Colon polyps Father   . High blood pressure Brother   . Prostate cancer Maternal Grandfather   . Colon cancer Maternal Uncle   . Stomach cancer Neg Hx     Social History Social History   Tobacco Use  . Smoking status: Never  . Smokeless tobacco: Never  Vaping Use  . Vaping Use: Never used  Substance Use Topics  . Alcohol use: No    Comment: rarely  . Drug use: No     Allergies   Topamax [topiramate]   Review of Systems Review of Systems  Cardiovascular:  Positive for chest pain.  Neurological:  Positive for headaches.  All other systems reviewed and are negative.    Physical Exam Triage Vital Signs ED Triage Vitals  Enc Vitals Group     BP 11/18/21 1118 (!) 150/87     Pulse Rate 11/18/21 1118 81     Resp 11/18/21 1118 16     Temp 11/18/21 1118 98 F (36.7 C)     Temp Source 11/18/21 1118 Oral     SpO2 11/18/21 1118 99 %     Weight --      Height --      Head Circumference --      Peak Flow --      Pain Score 11/18/21 1120 6     Pain Loc --      Pain Edu? --      Excl. in [place]? --    No data found.  Updated Vital Signs BP (!) 150/87 (BP Location: Right Arm)   Pulse 81   Temp 98 F (36.7 C) (Oral)   Resp 16   LMP 10/19/2021   SpO2 99%   Visual Acuity Right Eye Distance:   Left Eye  Distance:   Bilateral Distance:    Right Eye Near:   Left Eye Near:  Bilateral Near:     Physical Exam Vitals and nursing note reviewed.  Constitutional:      Appearance: She is well-developed.  HENT:     Head: Normocephalic.     Right Ear: Tympanic membrane normal.     Left Ear: Tympanic membrane normal.     Nose: Nose normal.     Mouth/Throat:     Mouth: Mucous membranes are moist.  Cardiovascular:     Rate and Rhythm: Normal rate.  Pulmonary:     Effort: Pulmonary effort is normal.  Abdominal:     General: Abdomen is flat. There is no distension.  Musculoskeletal:        General: Normal range of motion.     Cervical back: Normal range of motion.  Skin:    General: Skin is warm.  Neurological:     General: No focal deficit present.     Mental Status: She is alert and oriented to person, place, and time.     UC Treatments / Results  Labs (all labs ordered are listed, but only abnormal results are displayed) Labs Reviewed - No data to display  EKG EKG shows a normal sinus rhythm at 78 . No st changes, normal qrs   Radiology No results found.  Procedures Procedures (including critical care time)  Medications Ordered in UC Medications - No data to display  Initial Impression / Assessment and Plan / UC Course  I have reviewed the triage vital signs and the nursing notes.  Pertinent labs & imaging results that were available during my care of the patient were reviewed by me and considered in my medical decision making (see chart for details).     MDM:  Pt counseled on hypertension  Pt given rx for hctz.  Pt advised to scheule to see primary care  Final Clinical Impressions(s) / UC Diagnoses   Final diagnoses:  Hypertension, unspecified type  Nonintractable headache, unspecified chronicity pattern, unspecified headache type     Discharge Instructions      See your Physician for recheck.  Return if any problems.    ED Prescriptions      Medication Sig Dispense Auth. Provider   hydrochlorothiazide (HYDRODIURIL) 12.5 MG tablet Take 2 tablets (25 mg total) by mouth daily. 30 tablet Fransico Meadow, [place]      PDMP not reviewed this encounter.   Fransico Meadow, [place] 11/18/21 1457

## 2021-11-28 ENCOUNTER — Other Ambulatory Visit (HOSPITAL_BASED_OUTPATIENT_CLINIC_OR_DEPARTMENT_OTHER): Payer: Self-pay

## 2021-11-28 ENCOUNTER — Encounter: Payer: Self-pay | Admitting: Family Medicine

## 2021-11-28 ENCOUNTER — Ambulatory Visit: Payer: 59 | Admitting: Family Medicine

## 2021-11-28 VITALS — BP 133/76 | HR 75 | Ht 68.0 in | Wt 223.0 lb

## 2021-11-28 DIAGNOSIS — I10 Essential (primary) hypertension: Secondary | ICD-10-CM

## 2021-11-28 DIAGNOSIS — Z23 Encounter for immunization: Secondary | ICD-10-CM | POA: Diagnosis not present

## 2021-11-28 DIAGNOSIS — F411 Generalized anxiety disorder: Secondary | ICD-10-CM | POA: Diagnosis not present

## 2021-11-28 MED ORDER — ESCITALOPRAM OXALATE 10 MG PO TABS
10.0000 mg | ORAL_TABLET | Freq: Every day | ORAL | 2 refills | Status: DC
  Filled 2021-11-28: qty 30, 30d supply, fill #0
  Filled 2021-12-24: qty 30, 30d supply, fill #1
  Filled 2022-02-02: qty 30, 30d supply, fill #2

## 2021-11-28 MED ORDER — BUSPIRONE HCL 15 MG PO TABS
15.0000 mg | ORAL_TABLET | Freq: Three times a day (TID) | ORAL | 1 refills | Status: DC | PRN
  Filled 2021-11-28: qty 30, 10d supply, fill #0

## 2021-11-28 NOTE — Progress Notes (Signed)
Established patient visit   Patient: [name]   DOB: Nov 08, 1975   46 y.o. Female  MRN: [id number] Visit Date: 11/28/2021  Today's healthcare provider: Owens Loffler, DO   Chief Complaint  Patient presents with   Hypertension    SUBJECTIVE    Chief Complaint  Patient presents with   Hypertension   HPI  Pt presents to establish care. She has concerns today of HTN, stress, and anxiety. She was recently seen in urgent care on 11/18/21 for elevated BP of 150/87 and started on hctz '25mg'$ . BP elevated at beginning of visit but patient says her home BP readings have been fine.   Pt admits to being extremely stressed and feeling as though she is wrapped tightly like a rubber band. 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 He has been having multiple seizures a day and this gives her a lot of anxiety about when and if he will have a seizure again. She feels anxious and on edge to the point where she would like to try medication. She does admit to panic attacks and will usually get them when her son seizes.   Review of Systems  Constitutional:  Negative for activity change, fatigue and fever.  Respiratory:  Negative for cough and shortness of breath.   Cardiovascular:  Negative for chest pain.  Gastrointestinal:  Negative for abdominal pain.  Genitourinary:  Negative for difficulty urinating.  Psychiatric/Behavioral:  The patient is nervous/anxious.        Current Meds  Medication Sig   busPIRone (BUSPAR) 15 MG tablet Take 1 tablet (15 mg total) by mouth 3 (three) times daily as needed.   escitalopram (LEXAPRO) 10 MG tablet Take 1 tablet (10 mg total) by mouth daily.   Ferrous Gluconate-C-Folic Acid (IRON-C PO) Take by mouth daily.   hydrochlorothiazide (HYDRODIURIL) 12.5 MG tablet Take 2 tablets (25 mg total) by mouth daily.   Multiple Vitamin (MULTIVITAMIN) tablet Take 1 tablet by mouth daily.   OVER THE COUNTER MEDICATION Omega 3 fish oil 2 caps  bid   UNABLE TO FIND Vitamin b 1000 mg daily   VITAMIN D PO Take 5,000 Units by mouth daily.   [DISCONTINUED] ibuprofen (ADVIL,MOTRIN) 600 MG tablet Take 1 tablet (600 mg total) by mouth every 6 (six) hours as needed.    OBJECTIVE    BP (!) 148/68   Pulse 76   Ht '5\' 8"'$  (1.727 m)   Wt 223 lb (101.2 kg)   LMP 10/19/2021   SpO2 97%   BMI 33.91 kg/m   Physical Exam Vitals and nursing note reviewed.  Constitutional:      General: She is not in acute distress.    Appearance: Normal appearance.  HENT:     Head: Normocephalic and atraumatic.     Right Ear: External ear normal.     Left Ear: External ear normal.     Nose: Nose normal.  Eyes:     Conjunctiva/sclera: Conjunctivae normal.  Cardiovascular:     Rate and Rhythm: Normal rate and regular rhythm.  Pulmonary:     Effort: Pulmonary effort is normal.     Breath sounds: Normal breath sounds.  Neurological:     General: No focal deficit present.     Mental Status: She is alert and oriented to person, place, and time.  Psychiatric:        Mood and Affect: Mood normal.  Behavior: Behavior normal.        Thought Content: Thought content normal.        Judgment: Judgment normal.          ASSESSMENT & PLAN    Problem List Items Addressed This Visit       Cardiovascular and Mediastinum   Primary hypertension - Primary    - repeat blood pressure wnl will keep BP meds where they are now. Did provide pt with home bp log to monitor.  - since she is on hctz '25mg'$  I have ordered a cmp to monitor electrolyte and kidney function - microalbumin:cr ratio ordered to check for proteinuria since bp has been uncontrolled in the past  - lipid panel ordered      Relevant Orders   COMPLETE METABOLIC PANEL WITH GFR   Microalbumin / creatinine urine ratio   Lipid panel     Other   Generalized anxiety disorder    - pt denies suicidal/homicidal ideation  - starting lexapro '10mg'$  and did discuss side effects  - added buspar  '15mg'$  for panic attacks throughout the day to use and explained usage of both medications - follow up in 4-6weeks to check efficacy of medications - offered therapy       Relevant Medications   escitalopram (LEXAPRO) 10 MG tablet   busPIRone (BUSPAR) 15 MG tablet   Other Relevant Orders   TSH + free T4    Return in about 5 weeks (around 01/02/2022).      Meds ordered this encounter  Medications   escitalopram (LEXAPRO) 10 MG tablet    Sig: Take 1 tablet (10 mg total) by mouth daily.    Dispense:  30 tablet    Refill:  2   busPIRone (BUSPAR) 15 MG tablet    Sig: Take 1 tablet (15 mg total) by mouth 3 (three) times daily as needed.    Dispense:  30 tablet    Refill:  1    Orders Placed This Encounter  Procedures   COMPLETE METABOLIC PANEL WITH GFR   Microalbumin / creatinine urine ratio   Lipid panel    Order Specific Question:   Has the patient fasted?    Answer:   No    Order Specific Question:   Release to patient    Answer:   Immediate   TSH + free T4     Owens Loffler, [place] [phone] (phone) [phone] (fax)  [place]

## 2021-11-28 NOTE — Assessment & Plan Note (Signed)
-   repeat blood pressure wnl will keep BP meds where they are now. Did provide pt with home bp log to monitor.  - since she is on hctz '25mg'$  I have ordered a cmp to monitor electrolyte and kidney function - microalbumin:cr ratio ordered to check for proteinuria since bp has been uncontrolled in the past  - lipid panel ordered

## 2021-11-28 NOTE — Assessment & Plan Note (Signed)
-   pt denies suicidal/homicidal ideation  - starting lexapro '10mg'$  and did discuss side effects  - added buspar '15mg'$  for panic attacks throughout the day to use and explained usage of both medications - follow up in 4-6weeks to check efficacy of medications - offered therapy

## 2021-11-29 ENCOUNTER — Other Ambulatory Visit (HOSPITAL_BASED_OUTPATIENT_CLINIC_OR_DEPARTMENT_OTHER): Payer: Self-pay

## 2021-11-29 ENCOUNTER — Other Ambulatory Visit: Payer: Self-pay | Admitting: Family Medicine

## 2021-11-29 ENCOUNTER — Encounter: Payer: Self-pay | Admitting: Family Medicine

## 2021-11-29 DIAGNOSIS — I10 Essential (primary) hypertension: Secondary | ICD-10-CM

## 2021-11-29 LAB — COMPLETE METABOLIC PANEL WITH GFR
AG Ratio: 1.4 (calc) (ref 1.0–2.5)
ALT: 18 U/L (ref 6–29)
AST: 15 U/L (ref 10–30)
Albumin: 4.2 g/dL (ref 3.6–5.1)
Alkaline phosphatase (APISO): 81 U/L (ref 31–125)
BUN: 11 mg/dL (ref 7–25)
CO2: 31 mmol/L (ref 20–32)
Calcium: 9.4 mg/dL (ref 8.6–10.2)
Chloride: 100 mmol/L (ref 98–110)
Creat: 0.7 mg/dL (ref 0.50–0.99)
Globulin: 3 g/dL (calc) (ref 1.9–3.7)
Glucose, Bld: 103 mg/dL — ABNORMAL HIGH (ref 65–99)
Potassium: 3.6 mmol/L (ref 3.5–5.3)
Sodium: 140 mmol/L (ref 135–146)
Total Bilirubin: 0.2 mg/dL (ref 0.2–1.2)
Total Protein: 7.2 g/dL (ref 6.1–8.1)
eGFR: 109 mL/min/{1.73_m2} (ref >=60)

## 2021-11-29 LAB — TSH+FREE T4: TSH W/REFLEX TO FT4: 1.84 mIU/L

## 2021-11-29 LAB — LIPID PANEL
Cholesterol: 194 mg/dL (ref <200)
HDL: 55 mg/dL (ref >=50)
LDL Cholesterol (Calc): 105 mg/dL (calc) — ABNORMAL HIGH
Non-HDL Cholesterol (Calc): 139 mg/dL (calc) — ABNORMAL HIGH (ref <130)
Total CHOL/HDL Ratio: 3.5 (calc) (ref <5.0)
Triglycerides: 227 mg/dL — ABNORMAL HIGH (ref <150)

## 2021-11-29 LAB — MICROALBUMIN / CREATININE URINE RATIO
Creatinine, Urine: 127 mg/dL (ref 20–275)
Microalb Creat Ratio: 8 mcg/mg creat (ref <30)
Microalb, Ur: 1 mg/dL

## 2021-11-29 MED ORDER — HYDROCHLOROTHIAZIDE 25 MG PO TABS
25.0000 mg | ORAL_TABLET | Freq: Every day | ORAL | 3 refills | Status: AC
  Filled 2021-11-29: qty 90, 90d supply, fill #0
  Filled 2022-03-14: qty 90, 90d supply, fill #1
  Filled 2022-07-26: qty 90, 90d supply, fill #2

## 2021-12-26 ENCOUNTER — Other Ambulatory Visit (HOSPITAL_BASED_OUTPATIENT_CLINIC_OR_DEPARTMENT_OTHER): Payer: Self-pay

## 2022-01-02 ENCOUNTER — Encounter: Payer: Self-pay | Admitting: Family Medicine

## 2022-01-02 ENCOUNTER — Ambulatory Visit: Payer: 59 | Admitting: Family Medicine

## 2022-01-02 VITALS — BP 121/78 | HR 72 | Ht 68.0 in | Wt 220.0 lb

## 2022-01-02 DIAGNOSIS — F411 Generalized anxiety disorder: Secondary | ICD-10-CM | POA: Diagnosis not present

## 2022-01-02 DIAGNOSIS — L409 Psoriasis, unspecified: Secondary | ICD-10-CM | POA: Diagnosis not present

## 2022-01-02 NOTE — Patient Instructions (Signed)
Cut lexapro '10mg'$  in half to '5mg'$   Reach out via mychart in 2 weeks to let me know how you are doing

## 2022-01-02 NOTE — Progress Notes (Signed)
Established patient visit   Patient: [name]   DOB: [birth year]   46 y.o. Female  MRN: [id number] Visit Date: 01/02/2022  Today's healthcare provider: Owens Loffler, DO   Chief Complaint  Patient presents with   Follow-up    SUBJECTIVE    Chief Complaint  Patient presents with   Follow-up   HPI  Anxiety  She is on lexapro '10mg'$ . She feels more relaxed but is lacking motivation to do anything. She tells me she doesn't care about work. In terms of her buspar prescription, she has only had to take 7.'5mg'$  twice since she got the prescription. Her mom is currently sick in the hospital and is moving to comfort measures.   Psoriasis  She has a hx of psoriasis and needs a referral to derm.  Review of Systems  Constitutional:  Negative for activity change, fatigue and fever.  Respiratory:  Negative for cough and shortness of breath.   Cardiovascular:  Negative for chest pain.  Gastrointestinal:  Negative for abdominal pain.  Genitourinary:  Negative for difficulty urinating.       Current Meds  Medication Sig   busPIRone (BUSPAR) 15 MG tablet Take 1 tablet (15 mg total) by mouth 3 (three) times daily as needed.   escitalopram (LEXAPRO) 10 MG tablet Take 1 tablet (10 mg total) by mouth daily.   Ferrous Gluconate-C-Folic Acid (IRON-C PO) Take by mouth daily.   hydrochlorothiazide (HYDRODIURIL) 25 MG tablet Take 1 tablet (25 mg total) by mouth daily.   Multiple Vitamin (MULTIVITAMIN) tablet Take 1 tablet by mouth daily.   OVER THE COUNTER MEDICATION Omega 3 fish oil 2 caps bid   UNABLE TO FIND Vitamin b 1000 mg daily   VITAMIN D PO Take 5,000 Units by mouth daily.    OBJECTIVE    BP 121/78   Pulse 72   Ht '5\' 8"'$  (1.727 m)   Wt 220 lb (99.8 kg)   SpO2 100%   BMI 33.45 kg/m   Physical Exam Vitals and nursing note reviewed.  Constitutional:      General: She is not in acute distress.    Appearance: Normal appearance.  HENT:     Head: Normocephalic and  atraumatic.     Right Ear: External ear normal.     Left Ear: External ear normal.     Nose: Nose normal.  Eyes:     Conjunctiva/sclera: Conjunctivae normal.  Cardiovascular:     Rate and Rhythm: Normal rate and regular rhythm.  Pulmonary:     Effort: Pulmonary effort is normal.     Breath sounds: Normal breath sounds.  Neurological:     General: No focal deficit present.     Mental Status: She is alert and oriented to person, place, and time.  Psychiatric:        Mood and Affect: Mood normal.        Behavior: Behavior normal.        Thought Content: Thought content normal.        Judgment: Judgment normal.         ASSESSMENT & PLAN    Problem List Items Addressed This Visit       Musculoskeletal and Integument   Psoriasis    - sent a referral to derm       Relevant Orders   Ambulatory referral to Dermatology     Other   Generalized anxiety disorder - Primary    - will decrease lexapro to  $'5mg'k$  to see if this improves symptoms  - if improves her lack of motivation will keep at '5mg'$  - if this does not improve her symptoms, we will go ahead and try wellbutrin and see if this helps  - also wonder if this is the stage of life she is in and with her mom being sick if she is having different priorities       Return follow up in two weeks.      No orders of the defined types were placed in this encounter.   Orders Placed This Encounter  Procedures   Ambulatory referral to Dermatology    Referral Priority:   Routine    Referral Type:   Consultation    Referral Reason:   Specialty Services Required    Requested Specialty:   Dermatology    Number of Visits Requested:   [place], [place] [phone] (phone) [phone] (fax)  [place]

## 2022-01-02 NOTE — Assessment & Plan Note (Signed)
-   sent a referral to derm

## 2022-01-02 NOTE — Assessment & Plan Note (Addendum)
-   will decrease lexapro to '5mg'$  to see if this improves symptoms  - if improves her lack of motivation will keep at '5mg'$  - if this does not improve her symptoms, we will go ahead and try wellbutrin and see if this helps  - also wonder if this is the stage of life she is in and with her mom being sick if she is having different priorities

## 2022-02-02 ENCOUNTER — Encounter: Payer: Self-pay | Admitting: Family

## 2022-02-04 ENCOUNTER — Encounter: Payer: Self-pay | Admitting: Family

## 2022-03-14 ENCOUNTER — Other Ambulatory Visit: Payer: Self-pay

## 2022-03-14 ENCOUNTER — Ambulatory Visit
Admission: RE | Admit: 2022-03-14 | Discharge: 2022-03-14 | Disposition: A | Discharge status: Home / Self Care | Payer: Commercial Managed Care - PPO | Source: Ambulatory Visit | Attending: Urgent Care | Admitting: Urgent Care

## 2022-03-14 ENCOUNTER — Other Ambulatory Visit: Payer: Self-pay | Admitting: Family Medicine

## 2022-03-14 VITALS — BP 119/78 | HR 98 | Temp 98.0°F | Resp 17

## 2022-03-14 DIAGNOSIS — J020 Streptococcal pharyngitis: Secondary | ICD-10-CM | POA: Diagnosis not present

## 2022-03-14 LAB — POCT RAPID STREP A (OFFICE): Rapid Strep A Screen: POSITIVE — AB

## 2022-03-14 MED ORDER — PENICILLIN V POTASSIUM 500 MG PO TABS: 500.0000 mg | ORAL_TABLET | Freq: Two times a day (BID) | ORAL | 0 refills | Status: AC

## 2022-03-14 NOTE — ED Triage Notes (Addendum)
Pt c/o sore throat, fever and chills since Sunday. Also c/o RT ear pain. Tmax fever 101.6 this am. Hx of strep

## 2022-03-14 NOTE — ED Provider Notes (Signed)
[facility]    CSN: [id number] Arrival date & time: 03/14/22  1204      History   Chief Complaint Chief Complaint  Patient presents with   Sore Throat   Fever   Chills    HPI [name] is a 47 y.o. female.   Pt c/o sore throat, fever and chills since Sunday. Also c/o RT ear pain. Tmax fever 101.6 this am. Hx of strep twice last year. Pt states her teenager and her 68yo son have both had strep within the past two weeks. Did have nausea yesterday, resolved today. No additional GI sx, no rash.   Sore Throat  Fever   Past Medical History:  Diagnosis Date   Abnormal EKG    worked up  with  echo 04-02-2019,sr decreasing r wave progression   Abnormal Pap smear of cervix    Anemia    last iron infusion 12-22-2020   Anxiety    Chicken pox    as child   Depression    Flu    Headache(784.0)    History of COVID-19 02/19/2019   IBS (irritable bowel syndrome)    Migraines 11/01/2020   none recent   Placenta previa 02/20/2010   with current pregnancy-type and cross 2 Units per protocol   PONV (postoperative nausea and vomiting)    likes scopolamine patch   Psoriasis 11/01/2020   Strep throat    Syringomyelia, acquired ([place])    Wears glasses 11/01/2020    Patient Active Problem List   Diagnosis Date Noted   Psoriasis 01/02/2022   Primary hypertension 11/28/2021   Generalized anxiety disorder 11/28/2021   Iron deficiency 11/17/2020   At moderate risk for venous thromboembolism (VTE) 11/10/2020   Closed nondisplaced fracture of navicular bone of right foot 11/04/2018   IDA (iron deficiency anemia) 06/26/2017   Iron deficiency anemia due to chronic blood loss 06/26/2017   B12 deficiency 03/30/2017   Asthma 12/08/2016   Scalp lesion 01/01/2014   Allergic rhinitis 11/11/2013   Daytime somnolence 09/04/2013   Syringomyelia ([place]) 05/09/2013   Mass 05/09/2013   Paresthesia 07/19/2012   Depression 07/19/2012   Sensory disturbance 06/27/2012   Leg  weakness 06/27/2012   Leukocytosis 06/27/2012   Microcytic anemia 06/27/2012    Past Surgical History:  Procedure Laterality Date   APPENDECTOMY  02/21/2003   laparoscopic   arthroscopic knee Left 02/21/1988   CESAREAN SECTION  12/27/2010   Procedure: CESAREAN SECTION;  Surgeon: Shon Millet II;  Location: [facility];  Service: Gynecology;  Laterality: N/A;   CESAREAN SECTION N/A 02/03/2015   Procedure: CESAREAN SECTION;  Surgeon: Everlene Farrier, MD;  Location: [facility];  Service: Obstetrics;  Laterality: N/A;   left breast reduction  02/20/1994   TUBAL LIGATION Bilateral 02/03/2015   Procedure: BILATERAL TUBAL LIGATION;  Surgeon: Everlene Farrier, MD;  Location: [facility];  Service: Obstetrics;  Laterality: Bilateral;   WISDOM TOOTH EXTRACTION  2001    OB History     Gravida  3   Para  3   Term  2   Preterm  1   AB      Living  3      SAB      IAB      Ectopic      Multiple  0   Live Births  3            Home Medications    Prior to Admission medications   Medication Sig Start  Date End Date Taking? Authorizing Provider  penicillin v potassium (VEETID) 500 MG tablet Take 1 tablet (500 mg total) by mouth in the morning and at bedtime for 10 days. 03/14/22 03/24/22 Yes Nalany Steedley L, PA  busPIRone (BUSPAR) 15 MG tablet Take 1 tablet (15 mg total) by mouth 3 (three) times daily as needed. 11/28/21   Owens Loffler, DO  escitalopram (LEXAPRO) 10 MG tablet Take 1 tablet (10 mg total) by mouth daily. 11/28/21   Owens Loffler, DO  Ferrous Gluconate-C-Folic Acid (IRON-C PO) Take by mouth daily.    [provider]  hydrochlorothiazide (HYDRODIURIL) 25 MG tablet Take 1 tablet (25 mg total) by mouth daily. 11/29/21   Owens Loffler, DO  Multiple Vitamin (MULTIVITAMIN) tablet Take 1 tablet by mouth daily.    [provider]  OVER THE COUNTER MEDICATION Omega 3 fish oil 2 caps bid    [provider]  UNABLE TO FIND Vitamin b 1000 mg daily    [provider]  VITAMIN D PO Take 5,000 Units by mouth daily.    [provider]    Family History Family History  Problem Relation Age of Onset   High blood pressure Mother    Arthritis Mother    Hypertension Mother    Other Mother        "Multisystem atrophy"   Clotting disorder Mother    Irritable bowel syndrome Mother    Alcohol abuse Father    Arthritis Father    Colon polyps Father    High blood pressure Brother    Prostate cancer Maternal Grandfather    Colon cancer Maternal Uncle    Cardiomyopathy Other    Parkinson's disease Other    Skin cancer Other    Stroke Other    Stomach cancer Neg Hx     Social History Social History   Tobacco Use   Smoking status: Never   Smokeless tobacco: Never  Vaping Use   Vaping Use: Never used  Substance Use Topics   Alcohol use: No    Comment: rarely   Drug use: Never     Allergies   Topamax [topiramate]   Review of Systems Review of Systems  Constitutional:  Positive for fever.  As per HPI   Physical Exam Triage Vital Signs ED Triage Vitals  Enc Vitals Group     BP 03/14/22 1222 119/78     Pulse Rate 03/14/22 1222 98     Resp 03/14/22 1222 17     Temp 03/14/22 1222 98 F (36.7 C)     Temp Source 03/14/22 1222 Oral     SpO2 03/14/22 1222 97 %     Weight --      Height --      Head Circumference --      Peak Flow --      Pain Score 03/14/22 1223 7     Pain Loc --      Pain Edu? --      Excl. in [place]? --    No data found.  Updated Vital Signs BP 119/78 (BP Location: Right Arm)   Pulse 98   Temp 98 F (36.7 C) (Oral)   Resp 17   SpO2 97%   Visual Acuity Right Eye Distance:   Left Eye Distance:   Bilateral Distance:    Right Eye Near:   Left Eye Near:    Bilateral Near:     Physical Exam Vitals and nursing note  reviewed.  Constitutional:      General: She is not in acute distress.    Appearance: She is well-developed and normal weight. She is ill-appearing. She is not  toxic-appearing or diaphoretic.  HENT:     Head: Normocephalic and atraumatic.     Right Ear: Tympanic membrane and ear canal normal. No drainage, swelling or tenderness. No middle ear effusion. Tympanic membrane is not erythematous.     Left Ear: Tympanic membrane and ear canal normal. No drainage, swelling or tenderness.  No middle ear effusion. Tympanic membrane is not erythematous.     Nose: No congestion or rhinorrhea.     Mouth/Throat:     Mouth: Mucous membranes are moist.     Pharynx: Oropharyngeal exudate and posterior oropharyngeal erythema present. No pharyngeal swelling or uvula swelling.     Tonsils: Tonsillar exudate present. No tonsillar abscesses. 2+ on the right. 2+ on the left.  Eyes:     Conjunctiva/sclera: Conjunctivae normal.     Pupils: Pupils are equal, round, and reactive to light.  Neck:     Thyroid: No thyromegaly.  Cardiovascular:     Rate and Rhythm: Normal rate and regular rhythm.     Heart sounds: Normal heart sounds. No murmur heard.    No friction rub. No gallop.  Pulmonary:     Effort: Pulmonary effort is normal. No respiratory distress.     Breath sounds: Normal breath sounds. No stridor. No wheezing, rhonchi or rales.  Chest:     Chest wall: No tenderness.  Abdominal:     General: There is no distension.     Palpations: Abdomen is soft.     Tenderness: There is no abdominal tenderness. There is no rebound.  Musculoskeletal:     Cervical back: Normal range of motion and neck supple.  Lymphadenopathy:     Cervical: No cervical adenopathy.  Skin:    General: Skin is warm.     Capillary Refill: Capillary refill takes less than 2 seconds.     Findings: No rash.  Neurological:     General: No focal deficit present.     Mental Status: She is alert and oriented to person, place, and time.  Psychiatric:        Mood and Affect: Mood normal.      UC Treatments / Results  Labs (all labs ordered are listed, but only abnormal results are  displayed) Labs Reviewed  POCT RAPID STREP A (OFFICE) - Abnormal; Notable for the following components:      Result Value   Rapid Strep A Screen Positive (*)    All other components within normal limits    EKG   Radiology No results found.  Procedures Procedures (including critical care time)  Medications Ordered in UC Medications - No data to display  Initial Impression / Assessment and Plan / UC Course  I have reviewed the triage vital signs and the nursing notes.  Pertinent labs & imaging results that were available during my care of the patient were reviewed by me and considered in my medical decision making (see chart for details).     Strep pharyngitis -will initiate penicillin VK, twice daily x 10 days.  Additional supportive measures recommended.  Return to clinic precautions discussed.   Final Clinical Impressions(s) / UC Diagnoses   Final diagnoses:  Strep pharyngitis     Discharge Instructions      Your rapid strep was positive for strep throat. Please start taking antibiotics as prescribed. Do  not stop taking them until all has been completed. After completing the third day of antibiotics, throw away your current toothbrush and get a new one. This will prevent re-contamination. You may alternate ibuprofen and tylenol every 4 hours for fever and pain control. Do not share food, beverages, or kiss on the lips until >48 hours after starting antibiotics and >24 hours after fever resolved. This is contagious.      ED Prescriptions     Medication Sig Dispense Auth. Provider   penicillin v potassium (VEETID) 500 MG tablet Take 1 tablet (500 mg total) by mouth in the morning and at bedtime for 10 days. 20 tablet [employer], [place]      PDMP not reviewed this encounter.   Chaney Malling, [place] 03/14/22 1305

## 2022-03-14 NOTE — Discharge Instructions (Signed)
Your rapid strep was positive for strep throat. Please start taking antibiotics as prescribed. Do not stop taking them until all has been completed. After completing the third day of antibiotics, throw away your current toothbrush and get a new one. This will prevent re-contamination. You may alternate ibuprofen and tylenol every 4 hours for fever and pain control. Do not share food, beverages, or kiss on the lips until >48 hours after starting antibiotics and >24 hours after fever resolved. This is contagious.   

## 2022-03-27 ENCOUNTER — Other Ambulatory Visit: Payer: Self-pay | Admitting: Family Medicine

## 2022-03-30 ENCOUNTER — Other Ambulatory Visit (HOSPITAL_BASED_OUTPATIENT_CLINIC_OR_DEPARTMENT_OTHER): Payer: Self-pay

## 2022-03-30 MED ORDER — ESCITALOPRAM OXALATE 10 MG PO TABS
10.0000 mg | ORAL_TABLET | Freq: Every day | ORAL | 2 refills | Status: DC
  Filled 2022-03-30: qty 30, 30d supply, fill #0
  Filled 2022-05-25: qty 30, 30d supply, fill #1
  Filled 2022-07-26: qty 30, 30d supply, fill #2

## 2022-04-18 ENCOUNTER — Other Ambulatory Visit (HOSPITAL_BASED_OUTPATIENT_CLINIC_OR_DEPARTMENT_OTHER): Payer: Self-pay

## 2022-04-18 ENCOUNTER — Encounter: Payer: Self-pay | Admitting: Family

## 2022-04-18 DIAGNOSIS — L400 Psoriasis vulgaris: Secondary | ICD-10-CM | POA: Diagnosis not present

## 2022-04-18 DIAGNOSIS — L718 Other rosacea: Secondary | ICD-10-CM | POA: Diagnosis not present

## 2022-04-18 DIAGNOSIS — Z79899 Other long term (current) drug therapy: Secondary | ICD-10-CM | POA: Diagnosis not present

## 2022-04-18 MED ORDER — CLOBETASOL PROPIONATE 0.05 % EX SOLN
CUTANEOUS | 1 refills | Status: AC
  Filled 2022-04-18: qty 50, 30d supply, fill #0
  Filled 2022-07-26: qty 50, 30d supply, fill #1

## 2022-04-18 MED ORDER — FLUOCINOLONE ACETONIDE SCALP 0.01 % EX OIL
TOPICAL_OIL | CUTANEOUS | 3 refills | Status: DC
  Filled 2022-04-18: qty 118, 30d supply, fill #0

## 2022-05-23 DIAGNOSIS — H53021 Refractive amblyopia, right eye: Secondary | ICD-10-CM | POA: Diagnosis not present

## 2022-05-23 DIAGNOSIS — H524 Presbyopia: Secondary | ICD-10-CM | POA: Diagnosis not present

## 2022-05-23 DIAGNOSIS — H52221 Regular astigmatism, right eye: Secondary | ICD-10-CM | POA: Diagnosis not present

## 2022-06-05 ENCOUNTER — Encounter: Payer: Self-pay | Admitting: *Deleted

## 2022-06-12 ENCOUNTER — Other Ambulatory Visit (HOSPITAL_COMMUNITY): Payer: Self-pay

## 2022-06-12 ENCOUNTER — Other Ambulatory Visit: Payer: Self-pay

## 2022-06-12 MED ORDER — TREMFYA 100 MG/ML ~~LOC~~ SOAJ: SUBCUTANEOUS | 5 refills | Status: DC

## 2022-06-12 MED ORDER — TREMFYA 100 MG/ML ~~LOC~~ SOAJ: SUBCUTANEOUS | 1 refills | Status: DC

## 2022-06-13 ENCOUNTER — Other Ambulatory Visit (HOSPITAL_COMMUNITY): Payer: Self-pay

## 2022-06-13 ENCOUNTER — Other Ambulatory Visit: Payer: Self-pay

## 2022-06-13 ENCOUNTER — Encounter: Payer: Self-pay | Admitting: Pharmacist

## 2022-06-13 ENCOUNTER — Other Ambulatory Visit (HOSPITAL_BASED_OUTPATIENT_CLINIC_OR_DEPARTMENT_OTHER): Payer: Self-pay

## 2022-06-13 ENCOUNTER — Encounter: Payer: Self-pay | Admitting: Family

## 2022-06-13 ENCOUNTER — Ambulatory Visit: Payer: Commercial Managed Care - PPO | Attending: Family Medicine | Admitting: Pharmacist

## 2022-06-13 DIAGNOSIS — Z79899 Other long term (current) drug therapy: Secondary | ICD-10-CM

## 2022-06-13 MED ORDER — TREMFYA 100 MG/ML ~~LOC~~ SOAJ
100.0000 mg | SUBCUTANEOUS | 5 refills | Status: DC
  Filled 2022-06-13 – 2022-08-30 (×2): qty 1, 56d supply, fill #0
  Filled 2022-10-25: qty 1, 56d supply, fill #1
  Filled 2022-12-28: qty 1, 56d supply, fill #2
  Filled 2023-02-13: qty 1, 56d supply, fill #3
  Filled 2023-04-13: qty 1, 56d supply, fill #4
  Filled 2023-06-13 (×2): qty 1, 56d supply, fill #5
  Filled ????-??-??: fill #0

## 2022-06-13 MED ORDER — TREMFYA 100 MG/ML ~~LOC~~ SOAJ
100.0000 mg | SUBCUTANEOUS | 1 refills | Status: DC
  Filled 2022-06-13 (×3): qty 1, 28d supply, fill #0
  Filled 2022-07-05: qty 1, 56d supply, fill #1

## 2022-06-13 NOTE — Progress Notes (Signed)
   S: Patient presents today for review of their specialty medication.   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 Patient is managed by Dr. Katrinka Blazing for this.   Dosing: Plaque psoriasis: SubQ: 100 mg at weeks 0, 4, and then every 8 weeks thereafter.  Adherence: has not started   Efficacy: has not started   Monitoring:  S/sx of infection: has not started  S/sx of hypersensitivity: has not started   Current adverse effects: has not started    O:     Lab Results  Component Value Date   WBC 12.0 (H) 08/09/2021   HGB 13.3 08/09/2021   HCT 40.0 08/09/2021   MCV 87.0 08/09/2021   PLT 339 08/09/2021      Chemistry      Component Value Date/Time   NA 140 11/28/2021 1133   K 3.6 11/28/2021 1133   CL 100 11/28/2021 1133   CO2 31 11/28/2021 1133   BUN 11 11/28/2021 1133   CREATININE 0.70 11/28/2021 1133      Component Value Date/Time   CALCIUM 9.4 11/28/2021 1133   ALKPHOS 70 08/09/2021 1854   AST 15 11/28/2021 1133   AST 18 11/10/2020 1211   ALT 18 11/28/2021 1133   ALT 18 11/10/2020 1211   BILITOT 0.2 11/28/2021 1133   BILITOT 0.3 11/10/2020 1211       A/P: 1. Medication review: patient is about to start on Tremfya for plaque psoriasis. Reviewed the medication with the patient, including the following: Tremfya is a medication used in the treatment of plaque psoriasis. Administer SubQ into front of thighs, lower abdomen (except for 2 inches around navel), or back of upper arms; do not inject into areas where the skin is tender, bruised, red, hard, thick, scaly, or affected by psoriasis. Possible adverse reactions include increased risk of infection, headache, and hypersensitivity reactions. Live vaccinations should be avoided. No recommendations for any changes.  Butch Penny, PharmD, [place]y [place], CPP Clinical Pharmacist [facility] [phone]

## 2022-07-05 ENCOUNTER — Other Ambulatory Visit (HOSPITAL_COMMUNITY): Payer: Self-pay

## 2022-07-11 ENCOUNTER — Other Ambulatory Visit (HOSPITAL_COMMUNITY): Payer: Self-pay

## 2022-07-12 ENCOUNTER — Encounter: Payer: Self-pay | Admitting: Family

## 2022-07-12 ENCOUNTER — Other Ambulatory Visit: Payer: Self-pay

## 2022-07-12 ENCOUNTER — Other Ambulatory Visit (HOSPITAL_COMMUNITY): Payer: Self-pay

## 2022-07-27 ENCOUNTER — Other Ambulatory Visit (HOSPITAL_COMMUNITY): Payer: Self-pay

## 2022-07-27 ENCOUNTER — Encounter: Payer: Self-pay | Admitting: Pharmacist

## 2022-07-27 ENCOUNTER — Other Ambulatory Visit: Payer: Self-pay

## 2022-07-31 ENCOUNTER — Other Ambulatory Visit (HOSPITAL_COMMUNITY): Payer: Self-pay

## 2022-08-10 ENCOUNTER — Other Ambulatory Visit (HOSPITAL_BASED_OUTPATIENT_CLINIC_OR_DEPARTMENT_OTHER): Payer: Self-pay

## 2022-08-10 DIAGNOSIS — L400 Psoriasis vulgaris: Secondary | ICD-10-CM | POA: Diagnosis not present

## 2022-08-10 MED ORDER — CALCIPOTRIENE 0.005 % EX SOLN
1.0000 | Freq: Every day | CUTANEOUS | 1 refills | Status: AC
  Filled 2022-08-10: qty 60, 30d supply, fill #0

## 2022-08-11 ENCOUNTER — Other Ambulatory Visit (HOSPITAL_BASED_OUTPATIENT_CLINIC_OR_DEPARTMENT_OTHER): Payer: Self-pay

## 2022-08-30 ENCOUNTER — Other Ambulatory Visit: Payer: Self-pay

## 2022-08-30 ENCOUNTER — Other Ambulatory Visit (HOSPITAL_COMMUNITY): Payer: Self-pay

## 2022-09-01 ENCOUNTER — Other Ambulatory Visit (HOSPITAL_COMMUNITY): Payer: Self-pay

## 2022-09-04 ENCOUNTER — Other Ambulatory Visit: Payer: Self-pay

## 2022-09-04 ENCOUNTER — Encounter: Payer: Self-pay | Admitting: Family

## 2022-09-08 DIAGNOSIS — Z1321 Encounter for screening for nutritional disorder: Secondary | ICD-10-CM | POA: Diagnosis not present

## 2022-09-08 DIAGNOSIS — Z13220 Encounter for screening for lipoid disorders: Secondary | ICD-10-CM | POA: Diagnosis not present

## 2022-09-08 DIAGNOSIS — Z1231 Encounter for screening mammogram for malignant neoplasm of breast: Secondary | ICD-10-CM | POA: Diagnosis not present

## 2022-09-08 DIAGNOSIS — Z13228 Encounter for screening for other metabolic disorders: Secondary | ICD-10-CM | POA: Diagnosis not present

## 2022-09-08 DIAGNOSIS — Z1329 Encounter for screening for other suspected endocrine disorder: Secondary | ICD-10-CM | POA: Diagnosis not present

## 2022-09-08 DIAGNOSIS — Z130 Encounter for screening for diseases of the blood and blood-forming organs and certain disorders involving the immune mechanism: Secondary | ICD-10-CM | POA: Diagnosis not present

## 2022-09-08 DIAGNOSIS — Z131 Encounter for screening for diabetes mellitus: Secondary | ICD-10-CM | POA: Diagnosis not present

## 2022-09-08 DIAGNOSIS — Z6835 Body mass index (BMI) 35.0-35.9, adult: Secondary | ICD-10-CM | POA: Diagnosis not present

## 2022-09-08 DIAGNOSIS — Z01419 Encounter for gynecological examination (general) (routine) without abnormal findings: Secondary | ICD-10-CM | POA: Diagnosis not present

## 2022-10-25 ENCOUNTER — Other Ambulatory Visit (HOSPITAL_COMMUNITY): Payer: Self-pay

## 2022-11-08 ENCOUNTER — Other Ambulatory Visit (HOSPITAL_COMMUNITY): Payer: Self-pay

## 2022-11-16 ENCOUNTER — Ambulatory Visit: Payer: Self-pay | Admitting: Dermatology

## 2022-11-20 ENCOUNTER — Encounter: Payer: Self-pay | Admitting: Family

## 2022-11-29 ENCOUNTER — Encounter: Payer: Self-pay | Admitting: Family

## 2022-11-29 ENCOUNTER — Other Ambulatory Visit: Payer: Self-pay | Admitting: Podiatry

## 2022-11-29 DIAGNOSIS — M79672 Pain in left foot: Secondary | ICD-10-CM

## 2022-11-30 ENCOUNTER — Ambulatory Visit: Payer: Commercial Managed Care - PPO

## 2022-11-30 ENCOUNTER — Encounter: Payer: Self-pay | Admitting: Podiatry

## 2022-11-30 ENCOUNTER — Ambulatory Visit: Payer: Commercial Managed Care - PPO | Admitting: Podiatry

## 2022-11-30 ENCOUNTER — Other Ambulatory Visit (HOSPITAL_BASED_OUTPATIENT_CLINIC_OR_DEPARTMENT_OTHER): Payer: Self-pay

## 2022-11-30 DIAGNOSIS — M79672 Pain in left foot: Secondary | ICD-10-CM

## 2022-11-30 DIAGNOSIS — M2042 Other hammer toe(s) (acquired), left foot: Secondary | ICD-10-CM

## 2022-11-30 DIAGNOSIS — M722 Plantar fascial fibromatosis: Secondary | ICD-10-CM | POA: Diagnosis not present

## 2022-11-30 MED ORDER — MELOXICAM 15 MG PO TABS
15.0000 mg | ORAL_TABLET | Freq: Every day | ORAL | 0 refills | Status: DC
  Filled 2022-11-30: qty 30, 30d supply, fill #0

## 2022-11-30 NOTE — Progress Notes (Signed)
Subjective:  Patient ID: [name], female    DOB: [birth year],   MRN: [id number]  No chief complaint on file.   47 y.o. female presents for concern of left heel pain that has been ongoing for a while. Relates she has pain with first steps after rest. States she has tried supportive shoes and ibuprofen with some help. Also relates concern for left fourth toe hammertoe that she broke years ago occasionally gives pain.   . Denies any other pedal complaints. Denies n/v/f/c.   Past Medical History:  Diagnosis Date   Abnormal EKG    worked up  with  echo 04-02-2019,sr decreasing r wave progression   Abnormal Pap smear of cervix    Anemia    last iron infusion 12-22-2020   Anxiety    Chicken pox    as child   Depression    Flu    Headache(784.0)    History of COVID-19 02/19/2019   IBS (irritable bowel syndrome)    Migraines 11/01/2020   none recent   Placenta previa 02/20/2010   with current pregnancy-type and cross 2 Units per protocol   PONV (postoperative nausea and vomiting)    likes scopolamine patch   Psoriasis 11/01/2020   Strep throat    Syringomyelia, acquired ([facility])    Wears glasses 11/01/2020    Objective:  Physical Exam: Vascular: DP/PT pulses 2/4 bilateral. CFT <3 seconds. Normal hair growth on digits. No edema.  Skin. No lacerations or abrasions bilateral feet.  Musculoskeletal: MMT 5/5 bilateral lower extremities in DF, PF, Inversion and Eversion. Deceased ROM in DF of ankle joint. Hammered fourth digit on right. Tender to the medial calcaneal tubercle left . No pain with achilles, PT or arch. No pain with calcaneal squeeze.  Neurological: Sensation intact to light touch.   Assessment:   1. Plantar fasciitis, left   2. Hammertoe of left foot      Plan:  Patient was evaluated and treated and all questions answered. Discussed plantar fasciitis with patient.  X-rays reviewed and discussed with patient. No acute fractures or dislocations noted. Mild  spurring noted at inferior calcaneus.  Discussed treatment options including, ice, NSAIDS, supportive shoes, bracing, and stretching. Stretching exercises provided to be done on a daily basis.   Prescription for meloxicam provided and sent to pharmacy. PF brace dispensed.  -Educated on hammertoes and treatment options  -Discussed padding including toe caps and crest pads.  -Discussed need for potential surgery if pain does not improved.  Follow-up 6 weeks or sooner if any problems arise. In the meantime, encouraged to call the office with any questions, concerns, change in symptoms.      Louann Sjogren, DPM

## 2022-11-30 NOTE — Patient Instructions (Signed)

## 2022-12-21 ENCOUNTER — Other Ambulatory Visit: Payer: Self-pay | Admitting: Medical Genetics

## 2022-12-21 DIAGNOSIS — Z006 Encounter for examination for normal comparison and control in clinical research program: Secondary | ICD-10-CM

## 2022-12-28 ENCOUNTER — Other Ambulatory Visit: Payer: Self-pay

## 2022-12-28 ENCOUNTER — Other Ambulatory Visit (HOSPITAL_COMMUNITY): Payer: Self-pay

## 2022-12-28 NOTE — Progress Notes (Signed)
Specialty Pharmacy Ongoing Clinical Assessment Note  [name] is a 47 y.o. female who is being followed by the specialty pharmacy service for RxSp Psoriasis   Patient's specialty medication(s) reviewed today: Guselkumab   Missed doses in the last 4 weeks: 0   Patient/Caregiver did not have any additional questions or concerns.   Therapeutic benefit summary: Patient is achieving benefit   Adverse events/side effects summary: Experienced adverse events/side effects (Pt reports itching at the injection site which is tolerable and diarrhea which might be from her IBS, tolerable at this time.)   Patient's therapy is appropriate to: Continue    Goals Addressed             This Visit's Progress    Minimize recurrence of flares       Patient is on track. Patient will maintain adherence.  Patient reports that she is well-controlled at this time with no recent flares.          Follow up:  6 months  Servando Snare Specialty Pharmacist

## 2022-12-28 NOTE — Progress Notes (Signed)
Specialty Pharmacy Refill Coordination Note  [name] is a 47 y.o. female contacted today regarding refills of specialty medication(s) Guselkumab   Patient requested [name] at [facility] at [place] date: 01/01/23   Medication will be filled on 12/29/22 or 01/01/23.   This medication requires a new prior authorization and is currently being processed by the PA team. Specialty pharmacy team will contact patient if any delays arise.

## 2022-12-28 NOTE — Progress Notes (Signed)
Pharmacy Patient Advocate Encounter   Received notification from Patient Pharmacy that prior authorization for Tremfya is required/requested.   Insurance verification completed.   The patient is insured through [facility] .   Per test claim: PA required; PA submitted to above mentioned insurance via CoverMyMeds Key/confirmation #/EOC [id number] Status is pending

## 2022-12-29 ENCOUNTER — Other Ambulatory Visit: Payer: Self-pay

## 2022-12-29 NOTE — Progress Notes (Signed)
Pharmacy Patient Advocate Encounter  Received notification from [facility] that Prior Authorization for Tremfya has been APPROVED from 12/29/22 to 12/28/23   PA #/Case ID/Reference #: [id number]

## 2023-01-11 ENCOUNTER — Ambulatory Visit: Payer: Commercial Managed Care - PPO | Admitting: Podiatry

## 2023-01-22 ENCOUNTER — Other Ambulatory Visit (HOSPITAL_COMMUNITY)
Admission: RE | Admit: 2023-01-22 | Discharge: 2023-01-22 | Disposition: A | Discharge status: Home / Self Care | Payer: Commercial Managed Care - PPO | Source: Ambulatory Visit | Attending: Obstetrics and Gynecology | Admitting: Obstetrics and Gynecology

## 2023-01-22 DIAGNOSIS — Z006 Encounter for examination for normal comparison and control in clinical research program: Secondary | ICD-10-CM | POA: Insufficient documentation

## 2023-01-25 ENCOUNTER — Ambulatory Visit: Payer: Commercial Managed Care - PPO | Admitting: Podiatry

## 2023-01-25 ENCOUNTER — Other Ambulatory Visit (HOSPITAL_BASED_OUTPATIENT_CLINIC_OR_DEPARTMENT_OTHER): Payer: Self-pay

## 2023-01-25 DIAGNOSIS — M722 Plantar fascial fibromatosis: Secondary | ICD-10-CM | POA: Diagnosis not present

## 2023-01-25 MED ORDER — TRIAMCINOLONE ACETONIDE 10 MG/ML IJ SUSP
2.5000 mg | Freq: Once | INTRAMUSCULAR | Status: AC
  Administered 2023-01-25: 2.5 mg via INTRA_ARTICULAR

## 2023-01-25 MED ORDER — MELOXICAM 15 MG PO TABS
15.0000 mg | ORAL_TABLET | Freq: Every day | ORAL | 0 refills | Status: DC
  Filled 2023-01-25: qty 30, 30d supply, fill #0

## 2023-01-25 MED ORDER — DEXAMETHASONE SODIUM PHOSPHATE 120 MG/30ML IJ SOLN
4.0000 mg | Freq: Once | INTRAMUSCULAR | Status: AC
  Administered 2023-01-25: 4 mg via INTRA_ARTICULAR

## 2023-01-25 NOTE — Progress Notes (Signed)
  Subjective:  Patient ID: [name], female    DOB: [birth year],   MRN: [id number]  Chief Complaint  Patient presents with   Plantar Fasciitis    Pt presents for pf follow up states she is doing better but being on her foot for a long period of time cause her to have pain.    47 y.o. female presents for follow-up of left plantar fasciitis. Relates doing better but still has pain on and off. Relates about 30-40% better. Relates after a lot of activity still really bothers her. Has been using another support and stretching with little relief.    . Denies any other pedal complaints. Denies n/v/f/c.   Past Medical History:  Diagnosis Date   Abnormal EKG    worked up  with  echo 04-02-2019,sr decreasing r wave progression   Abnormal Pap smear of cervix    Anemia    last iron infusion 12-22-2020   Anxiety    Chicken pox    as child   Depression    Flu    Headache(784.0)    History of COVID-19 02/19/2019   IBS (irritable bowel syndrome)    Migraines 11/01/2020   none recent   Placenta previa 02/20/2010   with current pregnancy-type and cross 2 Units per protocol   PONV (postoperative nausea and vomiting)    likes scopolamine patch   Psoriasis 11/01/2020   Strep throat    Syringomyelia, acquired ([facility])    Wears glasses 11/01/2020    Objective:  Physical Exam: Vascular: DP/PT pulses 2/4 bilateral. CFT <3 seconds. Normal hair growth on digits. No edema.  Skin. No lacerations or abrasions bilateral feet.  Musculoskeletal: MMT 5/5 bilateral lower extremities in DF, PF, Inversion and Eversion. Deceased ROM in DF of ankle joint. Hammered fourth digit on right. Tender to the medial calcaneal tubercle left . No pain with achilles, PT or arch. No pain with calcaneal squeeze.  Neurological: Sensation intact to light touch.   Assessment:   1. Plantar fasciitis, left       Plan:  Patient was evaluated and treated and all questions answered. Discussed plantar fasciitis with  patient.  X-rays reviewed and discussed with patient. No acute fractures or dislocations noted. Mild spurring noted at inferior calcaneus.  Discussed treatment options including, ice, NSAIDS, supportive shoes, bracing, and stretching.  Continue stretching and support.  Meloxicam refilled  Injection offered today. Procedure below.  Deferred PT and will consider in future  Return in 6 weeks for recheck   Procedure: Injection Tendon/Ligament Discussed alternatives, risks, complications and verbal consent was obtained.  Location: Left plantar fascia . Skin Prep: Alcohol. Injectate: 1cc 0.5% marcaine plain, 1 cc dexamethasone 0.5 cc kenalog  Disposition: Patient tolerated procedure well. Injection site dressed with a band-aid.  Post-injection care was discussed and return precautions discussed.     Louann Sjogren, DPM

## 2023-01-30 LAB — GENECONNECT MOLECULAR SCREEN: Genetic Analysis Overall Interpretation: NEGATIVE

## 2023-02-13 ENCOUNTER — Other Ambulatory Visit: Payer: Self-pay

## 2023-02-13 ENCOUNTER — Other Ambulatory Visit (HOSPITAL_COMMUNITY): Payer: Self-pay

## 2023-02-13 NOTE — Progress Notes (Signed)
Specialty Pharmacy Refill Coordination Note  [name] is a 47 y.o. female contacted today regarding refills of specialty medication(s) Guselkumab [name]   Patient requested Delivery   Delivery date: 02/23/23   Verified address: [address] [place], [address]   Medication will be filled on 02/22/23.

## 2023-02-22 ENCOUNTER — Other Ambulatory Visit: Payer: Self-pay

## 2023-03-08 ENCOUNTER — Ambulatory Visit: Payer: Commercial Managed Care - PPO | Admitting: Podiatry

## 2023-03-16 ENCOUNTER — Encounter: Payer: Self-pay | Admitting: Podiatry

## 2023-03-16 ENCOUNTER — Ambulatory Visit: Payer: Commercial Managed Care - PPO | Admitting: Podiatry

## 2023-03-16 DIAGNOSIS — M722 Plantar fascial fibromatosis: Secondary | ICD-10-CM | POA: Diagnosis not present

## 2023-03-16 NOTE — Progress Notes (Signed)
  Subjective:  Patient ID: [name], female    DOB: 11-14-1975,   MRN: [id number]  No chief complaint on file.   48 y.o. female presents for follow-up of left plantar fasciitis. Relates doing better and almost 100% .Relates injections really helped. Has continued to stretch and wear support. .    . Denies any other pedal complaints. Denies n/v/f/c.   Past Medical History:  Diagnosis Date   Abnormal EKG    worked up  with  echo 04-02-2019,sr decreasing r wave progression   Abnormal Pap smear of cervix    Anemia    last iron infusion 12-22-2020   Anxiety    Chicken pox    as child   Depression    Flu    Headache(784.0)    History of COVID-19 02/19/2019   IBS (irritable bowel syndrome)    Migraines 11/01/2020   none recent   Placenta previa 02/20/2010   with current pregnancy-type and cross 2 Units per protocol   PONV (postoperative nausea and vomiting)    likes scopolamine patch   Psoriasis 11/01/2020   Strep throat    Syringomyelia, acquired ([facility])    Wears glasses 11/01/2020    Objective:  Physical Exam: Vascular: DP/PT pulses 2/4 bilateral. CFT <3 seconds. Normal hair growth on digits. No edema.  Skin. No lacerations or abrasions bilateral feet.  Musculoskeletal: MMT 5/5 bilateral lower extremities in DF, PF, Inversion and Eversion. Deceased ROM in DF of ankle joint. Hammered fourth digit on right. Non tender to the medial calcaneal tubercle left . No pain with achilles, PT or arch. No pain with calcaneal squeeze.  Neurological: Sensation intact to light touch.   Assessment:   1. Plantar fasciitis, left        Plan:  Patient was evaluated and treated and all questions answered. Discussed plantar fasciitis with patient.  X-rays reviewed and discussed with patient. No acute fractures or dislocations noted. Mild spurring noted at inferior calcaneus.  Discussed treatment options including, ice, NSAIDS, supportive shoes, bracing, and stretching.  Continue  stretching and support.  Continue meloxicam as needed.  Return as needed   Louann Sjogren, DPM

## 2023-03-27 ENCOUNTER — Other Ambulatory Visit: Payer: Self-pay

## 2023-03-27 DIAGNOSIS — L400 Psoriasis vulgaris: Secondary | ICD-10-CM | POA: Diagnosis not present

## 2023-03-27 DIAGNOSIS — D2372 Other benign neoplasm of skin of left lower limb, including hip: Secondary | ICD-10-CM | POA: Diagnosis not present

## 2023-03-27 MED ORDER — TREMFYA 100 MG/ML ~~LOC~~ SOAJ: SUBCUTANEOUS | 5 refills | Status: DC

## 2023-03-28 ENCOUNTER — Other Ambulatory Visit (HOSPITAL_BASED_OUTPATIENT_CLINIC_OR_DEPARTMENT_OTHER): Payer: Self-pay

## 2023-03-28 MED ORDER — HYDROCHLOROTHIAZIDE 25 MG PO TABS
25.0000 mg | ORAL_TABLET | Freq: Every day | ORAL | 1 refills | Status: DC
  Filled 2023-09-08: qty 90, 90d supply, fill #1

## 2023-03-28 MED ORDER — ONDANSETRON 4 MG PO TBDP: 4.0000 mg | ORAL_TABLET | Freq: Four times a day (QID) | ORAL | 0 refills | Status: DC

## 2023-03-30 ENCOUNTER — Other Ambulatory Visit (HOSPITAL_BASED_OUTPATIENT_CLINIC_OR_DEPARTMENT_OTHER): Payer: Self-pay

## 2023-03-30 ENCOUNTER — Encounter: Payer: Self-pay | Admitting: Family

## 2023-03-30 DIAGNOSIS — E782 Mixed hyperlipidemia: Secondary | ICD-10-CM | POA: Diagnosis not present

## 2023-03-30 DIAGNOSIS — I10 Essential (primary) hypertension: Secondary | ICD-10-CM | POA: Diagnosis not present

## 2023-03-30 DIAGNOSIS — R5383 Other fatigue: Secondary | ICD-10-CM | POA: Diagnosis not present

## 2023-03-30 DIAGNOSIS — Z13220 Encounter for screening for lipoid disorders: Secondary | ICD-10-CM | POA: Diagnosis not present

## 2023-03-30 DIAGNOSIS — E669 Obesity, unspecified: Secondary | ICD-10-CM | POA: Diagnosis not present

## 2023-03-30 MED ORDER — D3-50 1.25 MG (50000 UT) PO CAPS: 50000.0000 [IU] | ORAL_CAPSULE | ORAL | 0 refills | Status: AC

## 2023-04-13 ENCOUNTER — Other Ambulatory Visit: Payer: Self-pay

## 2023-04-13 NOTE — Progress Notes (Signed)
Specialty Pharmacy Refill Coordination Note  [name] is a 48 y.o. female contacted today regarding refills of specialty medication(s) Guselkumab [name]   Patient requested Delivery   Delivery date: 04/26/23   Verified address: [address] [place], [address]   Medication will be filled on 03.05.25.

## 2023-04-20 DIAGNOSIS — L400 Psoriasis vulgaris: Secondary | ICD-10-CM | POA: Diagnosis not present

## 2023-04-20 DIAGNOSIS — Z79899 Other long term (current) drug therapy: Secondary | ICD-10-CM | POA: Diagnosis not present

## 2023-04-24 ENCOUNTER — Other Ambulatory Visit: Payer: Self-pay

## 2023-04-24 NOTE — Progress Notes (Signed)
 Patient called the specialty pharmacy requesting an earlier delivery. Medication will be filled 04/24/23 and will be delivered to verified address on 04/25/23

## 2023-04-30 DIAGNOSIS — J984 Other disorders of lung: Secondary | ICD-10-CM | POA: Diagnosis not present

## 2023-04-30 DIAGNOSIS — Z111 Encounter for screening for respiratory tuberculosis: Secondary | ICD-10-CM | POA: Diagnosis not present

## 2023-05-04 ENCOUNTER — Encounter: Payer: Self-pay | Admitting: Family

## 2023-05-11 ENCOUNTER — Ambulatory Visit: Admitting: Infectious Disease

## 2023-05-24 ENCOUNTER — Ambulatory Visit: Admitting: Internal Medicine

## 2023-05-30 ENCOUNTER — Other Ambulatory Visit: Payer: Self-pay

## 2023-06-13 ENCOUNTER — Other Ambulatory Visit: Payer: Self-pay

## 2023-06-13 ENCOUNTER — Other Ambulatory Visit (HOSPITAL_COMMUNITY): Payer: Self-pay

## 2023-06-13 NOTE — Progress Notes (Signed)
 Specialty Pharmacy Refill Coordination Note  [name] is a 48 y.o. female contacted today regarding refills of specialty medication(s) Tremfya .  Patient requested (Patient-Rptd) Delivery   Delivery date: (Patient-Rptd) 06/18/23   Verified address: (Patient-Rptd) [address] [place], [place] [address]   Medication will be filled on 06/18/23. New delivery date is 06/19/23. Patient has been notified.

## 2023-06-18 ENCOUNTER — Encounter: Payer: Self-pay | Admitting: Pharmacist

## 2023-06-18 ENCOUNTER — Ambulatory Visit: Attending: Family Medicine | Admitting: Pharmacist

## 2023-06-18 ENCOUNTER — Other Ambulatory Visit: Payer: Self-pay

## 2023-06-18 DIAGNOSIS — Z79899 Other long term (current) drug therapy: Secondary | ICD-10-CM

## 2023-06-18 MED ORDER — TREMFYA 100 MG/ML ~~LOC~~ SOAJ
SUBCUTANEOUS | 5 refills | Status: DC
  Filled 2023-06-18: qty 1, 56d supply, fill #0

## 2023-06-18 NOTE — Progress Notes (Signed)
   S: Patient presents today for review of their specialty medication.   Patient is taking Tremfya  for plaque psoriasis. Patient is managed by Dr. Felipe Horton for this.   Dosing: Plaque psoriasis: SubQ: 100 mg at weeks 0, 4, and then every 8 weeks thereafter.  Adherence: confirms  Efficacy: reports that everything is going well with the medication.   Monitoring:  S/sx of infection: none reported  S/sx of hypersensitivity: none reported  Current adverse effects: none reported    O:     Lab Results  Component Value Date   WBC 12.0 (H) 08/09/2021   HGB 13.3 08/09/2021   HCT 40.0 08/09/2021   MCV 87.0 08/09/2021   PLT 339 08/09/2021      Chemistry      Component Value Date/Time   NA 140 11/28/2021 1133   K 3.6 11/28/2021 1133   CL 100 11/28/2021 1133   CO2 31 11/28/2021 1133   BUN 11 11/28/2021 1133   CREATININE 0.70 11/28/2021 1133      Component Value Date/Time   CALCIUM 9.4 11/28/2021 1133   ALKPHOS 70 08/09/2021 1854   AST 15 11/28/2021 1133   AST 18 11/10/2020 1211   ALT 18 11/28/2021 1133   ALT 18 11/10/2020 1211   BILITOT 0.2 11/28/2021 1133   BILITOT 0.3 11/10/2020 1211       A/P: 1. Medication review: patient is taking Tremfya  for plaque psoriasis. Reviewed the medication with the patient, including the following: Tremfya  is a medication used in the treatment of plaque psoriasis. Administer SubQ into front of thighs, lower abdomen (except for 2 inches around navel), or back of upper arms; do not inject into areas where the skin is tender, bruised, red, hard, thick, scaly, or affected by psoriasis. Possible adverse reactions include increased risk of infection, headache, and hypersensitivity reactions. Live vaccinations should be avoided. No recommendations for any changes.  Marene Shape, PharmD, Becky Bowels, CPP Clinical Pharmacist [facility] [phone]

## 2023-06-20 ENCOUNTER — Encounter: Payer: Self-pay | Admitting: Internal Medicine

## 2023-06-20 ENCOUNTER — Ambulatory Visit: Admitting: Internal Medicine

## 2023-06-20 ENCOUNTER — Other Ambulatory Visit (HOSPITAL_BASED_OUTPATIENT_CLINIC_OR_DEPARTMENT_OTHER): Payer: Self-pay

## 2023-06-20 ENCOUNTER — Other Ambulatory Visit: Payer: Self-pay

## 2023-06-20 ENCOUNTER — Other Ambulatory Visit (HOSPITAL_COMMUNITY): Payer: Self-pay

## 2023-06-20 VITALS — BP 105/71 | HR 74 | Temp 97.4°F | Ht 70.0 in | Wt 210.0 lb

## 2023-06-20 DIAGNOSIS — A150 Tuberculosis of lung: Secondary | ICD-10-CM

## 2023-06-20 DIAGNOSIS — R7612 Nonspecific reaction to cell mediated immunity measurement of gamma interferon antigen response without active tuberculosis: Secondary | ICD-10-CM | POA: Diagnosis not present

## 2023-06-20 DIAGNOSIS — L409 Psoriasis, unspecified: Secondary | ICD-10-CM | POA: Diagnosis not present

## 2023-06-20 DIAGNOSIS — I10 Essential (primary) hypertension: Secondary | ICD-10-CM

## 2023-06-20 DIAGNOSIS — Z227 Latent tuberculosis: Secondary | ICD-10-CM

## 2023-06-20 MED ORDER — ISONIAZID 300 MG PO TABS
900.0000 mg | ORAL_TABLET | ORAL | 0 refills | Status: DC
  Filled 2023-06-20: qty 40, 90d supply, fill #0

## 2023-06-20 MED ORDER — RIFAPENTINE 150 MG PO TABS
900.0000 mg | ORAL_TABLET | ORAL | 0 refills | Status: DC
  Filled 2023-06-20: qty 78, 90d supply, fill #0

## 2023-06-20 MED ORDER — ISONIAZID 300 MG PO TABS
900.0000 mg | ORAL_TABLET | ORAL | 2 refills | Status: DC
  Filled 2023-06-20: qty 12, 28d supply, fill #0

## 2023-06-20 MED ORDER — RIFAPENTINE 150 MG PO TABS
900.0000 mg | ORAL_TABLET | ORAL | 2 refills | Status: DC
  Filled 2023-06-20: qty 24, 28d supply, fill #0

## 2023-06-20 NOTE — Progress Notes (Signed)
 Patient Active Problem List   Diagnosis Date Noted   Psoriasis 01/02/2022   Primary hypertension 11/28/2021   Generalized anxiety disorder 11/28/2021   Iron  deficiency 11/17/2020   At moderate risk for venous thromboembolism (VTE) 11/10/2020   Closed nondisplaced fracture of navicular bone of right foot 11/04/2018   IDA (iron  deficiency anemia) 06/26/2017   Iron  deficiency anemia due to chronic blood loss 06/26/2017   B12 deficiency 03/30/2017   Asthma 12/08/2016   Scalp lesion 01/01/2014   Allergic rhinitis 11/11/2013   Daytime somnolence 09/04/2013   Syringomyelia (HCC) 05/09/2013   Mass 05/09/2013   Paresthesia 07/19/2012   Depression 07/19/2012   Sensory disturbance 06/27/2012   Leg weakness 06/27/2012   Leukocytosis 06/27/2012   Microcytic anemia 06/27/2012    Patient's Medications  New Prescriptions   No medications on file  Previous Medications   BUSPIRONE  (BUSPAR ) 15 MG TABLET    Take 1 tablet (15 mg total) by mouth 3 (three) times daily as needed.   CALCIPOTRIENE  0.005 % SOLUTION    Apply 1 Application topically daily to scalp.   CHOLECALCIFEROL (D3-50) 1.25 MG (50000 UT) CAPSULE    Take 1 capsule (50,000 Units total) by mouth once a week.   CLOBETASOL  (TEMOVATE ) 0.05 % EXTERNAL SOLUTION    Apply 1 (one) application(s) topicallly to affected area 2 (two) times a day.   ESCITALOPRAM  (LEXAPRO ) 10 MG TABLET    Take 1 tablet (10 mg total) by mouth daily.   FERROUS GLUCONATE-C-FOLIC ACID  (IRON -C PO)    Take by mouth daily.   FLUOCINOLONE  ACETONIDE SCALP 0.01 % OIL    Apply to scalp and leave in overnight or more than 4 hours before washing.   GUSELKUMAB  (TREMFYA ) 100 MG/ML PEN    MAINTENANCE DOSE: inject 1 pen SQ every 8 weeks.   HYDROCHLOROTHIAZIDE  (HYDRODIURIL ) 25 MG TABLET    Take 1 tablet (25 mg total) by mouth daily.   HYDROCHLOROTHIAZIDE  (HYDRODIURIL ) 25 MG TABLET    Take 1 tablet (25 mg total) by mouth daily.   MELOXICAM  (MOBIC ) 15 MG TABLET    Take 1  tablet (15 mg total) by mouth daily.   MULTIPLE VITAMIN (MULTIVITAMIN) TABLET    Take 1 tablet by mouth daily.   ONDANSETRON  (ZOFRAN -ODT) 4 MG DISINTEGRATING TABLET    Dissolve 1 tablet (4 mg total) in mouth every 6 (six)- 8(eight) hours as needed for nausea   OVER THE COUNTER MEDICATION    Omega 3 fish oil 2 caps bid   UNABLE TO FIND    Vitamin b 1000 mg daily   VITAMIN D  PO    Take 5,000 Units by mouth daily.  Modified Medications   No medications on file  Discontinued Medications   No medications on file    Subjective: 48 year old female with past medical history of anxiety/depression, iron  deficiency anemia, IBS, migraines presents for positive QuantiFERON testing by  [facility] [place] [facility].  Patient had positive QuantiFERON on 04/20/2023.  QuantiFERON was negative on 04/18/2022.  Chest x-ray on 05/03/2023 did not show evidence of tuberculosis.  Minor left lung base scarring was noted. Today 06/20/23 : Discussed the use of AI scribe software for clinical note transcription with the patient, who gave verbal consent to proceed. [name] is a 48 year old female who presents for evaluation of a positive quantiferon test. She was referred by [facility] for evaluation of a positive quantiferon test.  She had a negative quantiferon test  about a year ago, which turned positive in 04-24-23. She has no symptoms of active tuberculosis such as cough, fever, night sweats, or weight loss. Her chest x-ray is benign. She has not had any known exposures to tuberculosis, such as being in a skilled nursing facility, jail, or having contact with individuals known to have tuberculosis. Her mother, who lived in a skilled facility, passed away last 24-Apr-2023, but it is unclear if this was before or after her last negative test. She has not traveled or been exposed to high-risk environments.  She is currently on Tremfya  injections every eight weeks for scalp psoriasis, with her next dose due today. She  also takes hydrochlorothiazide  for blood pressure and various vitamins and supplements. No issues with her current medications.  Her son, who has special needs, was hospitalized with mycoplasma pneumonia last April, but there was no testing for tuberculosis, and she does not believe this is related. Her husband works as an Acupuncturist in a hospital, but she is unaware of any tuberculosis exposure through him. Review of Systems: Review of Systems  All other systems reviewed and are negative.   Past Medical History:  Diagnosis Date   Abnormal EKG    worked up  with  echo 04-02-2019,sr decreasing r wave progression   Abnormal Pap smear of cervix    Anemia    last iron  infusion 12-22-2020   Anxiety    Chicken pox    as child   Depression    Flu    Headache(784.0)    History of COVID-19 02/19/2019   IBS (irritable bowel syndrome)    Migraines 11/01/2020   none recent   Placenta previa 02/20/2010   with current pregnancy-type and cross 2 Units per protocol   PONV (postoperative nausea and vomiting)    likes scopolamine  patch   Psoriasis 11/01/2020   Strep throat    Syringomyelia, acquired (HCC)    Wears glasses 11/01/2020    Social History   Tobacco Use   Smoking status: Never   Smokeless tobacco: Never  Vaping Use   Vaping status: Never Used  Substance Use Topics   Alcohol use: No    Comment: rarely   Drug use: Never    Family History  Problem Relation Age of Onset   High blood pressure Mother    Arthritis Mother    Hypertension Mother    Other Mother        "Multisystem atrophy"   Clotting disorder Mother    Irritable bowel syndrome Mother    Alcohol abuse Father    Arthritis Father    Colon polyps Father    High blood pressure Brother    Prostate cancer Maternal Grandfather    Colon cancer Maternal Uncle    Cardiomyopathy Other    Parkinson's disease Other    Skin cancer Other    Stroke Other    Stomach cancer Neg Hx     Allergies  Allergen  Reactions   Topamax [Topiramate] Other (See Comments)    Pt states that she has trouble recalling words when she takes this medication.    Health Maintenance  Topic Date Due   COVID-19 Vaccine (1) Never done   Hepatitis C Screening  Never done   Pneumococcal Vaccine 102-89 Years old (1 of 2 - PCV) Never done   Cervical Cancer Screening (HPV/Pap Cotest)  01/21/2019   INFLUENZA VACCINE  09/21/2023   DTaP/Tdap/Td (2 - Td or Tdap) 02/21/2024   Colonoscopy  10/11/2027  HIV Screening  Completed   HPV VACCINES  Aged Out   Meningococcal B Vaccine  Aged Out    Objective:  Vitals:   06/20/23 0958  BP: 105/71  Pulse: 74  Temp: (!) 97.4 F (36.3 C)  TempSrc: Temporal  SpO2: 98%  Weight: 210 lb (95.3 kg)  Height: 5\' 10"  (1.778 m)   Body mass index is 30.13 kg/m.  Physical Exam Constitutional:      Appearance: Normal appearance.  HENT:     Head: Normocephalic and atraumatic.     Right Ear: Tympanic membrane normal.     Left Ear: Tympanic membrane normal.     Nose: Nose normal.     Mouth/Throat:     Mouth: Mucous membranes are moist.  Eyes:     Extraocular Movements: Extraocular movements intact.     Conjunctiva/sclera: Conjunctivae normal.     Pupils: Pupils are equal, round, and reactive to light.  Cardiovascular:     Rate and Rhythm: Normal rate and regular rhythm.     Heart sounds: No murmur heard.    No friction rub. No gallop.  Pulmonary:     Effort: Pulmonary effort is normal.     Breath sounds: Normal breath sounds.  Abdominal:     General: Abdomen is flat.     Palpations: Abdomen is soft.  Musculoskeletal:        General: Normal range of motion.  Skin:    General: Skin is warm and dry.  Neurological:     General: No focal deficit present.     Mental Status: She is alert and oriented to person, place, and time.  Psychiatric:        Mood and Affect: Mood normal.    Physical Exam   Lab Results Lab Results  Component Value Date   WBC 12.0 (H)  08/09/2021   HGB 13.3 08/09/2021   HCT 40.0 08/09/2021   MCV 87.0 08/09/2021   PLT 339 08/09/2021    Lab Results  Component Value Date   CREATININE 0.70 11/28/2021   BUN 11 11/28/2021   NA 140 11/28/2021   K 3.6 11/28/2021   CL 100 11/28/2021   CO2 31 11/28/2021    Lab Results  Component Value Date   ALT 18 11/28/2021   AST 15 11/28/2021   ALKPHOS 70 08/09/2021   BILITOT 0.2 11/28/2021    Lab Results  Component Value Date   CHOL 194 11/28/2021   HDL 55 11/28/2021   LDLCALC 105 (H) 11/28/2021   TRIG 227 (H) 11/28/2021   CHOLHDL 3.5 11/28/2021   Lab Results  Component Value Date   LABRPR Non Reactive 02/01/2015   No results found for: "HIV1RNAQUANT", "HIV1RNAVL", "CD4TABS"   Problem List Items Addressed This Visit   None  Results   Assessment/Plan #Positive IGRA #scalp psoriasis on trimphya(q8 weeks, due today) Positive quantiferon test in February. No active TB symptoms. Treatment recommended due to potential immunosuppression from psoriasis treatment. Discussed side effects of rifampin and isoniazid. - Repeat quantiferon test. - Initiate isoniazid 900mg  and rifapentine 900mg  weekly for three months. If IGRA negative then can stop treatment. Exposure unclear but used to visit mother at SNF last year.  - Order CBC and CMP for baseline labs. - Order HIV and hepatitis serologies. - Pharmacy consultation for medication counseling.  Psoriasis Managed with Tremfya  injections every eight weeks. Requires annual quantiferon testing due to immunosuppression.  Hypertension Managed with hydrochlorothiazide  and Baza.  Follow-up - Schedule follow-up appointment in one month.  Orlie Bjornstad, MD [facility] 06/20/2023, 10:09 AM   I have personally spent 84 minutes involved in face-to-face and non-face-to-face activities for this patient on the day of the visit. Professional time spent includes the following  activities: Preparing to see the patient (review of tests), Obtaining and/or reviewing separately obtained history (admission/discharge record), Performing a medically appropriate examination and/or evaluation , Ordering medications/tests/procedures, referring and communicating with other health care professionals, Documenting clinical information in the EMR, Independently interpreting results (not separately reported), Communicating results to the patient/family/caregiver, Counseling and educating the patient/family/caregiver and Care coordination (not separately reported).

## 2023-06-21 ENCOUNTER — Other Ambulatory Visit (HOSPITAL_BASED_OUTPATIENT_CLINIC_OR_DEPARTMENT_OTHER): Payer: Self-pay

## 2023-06-22 LAB — CBC WITH DIFFERENTIAL/PLATELET
Absolute Lymphocytes: 2157 {cells}/uL (ref 850–3900)
Absolute Monocytes: 542 {cells}/uL (ref 200–950)
Basophils Absolute: 67 {cells}/uL (ref 0–200)
Basophils Relative: 0.7 %
Eosinophils Absolute: 162 {cells}/uL (ref 15–500)
Eosinophils Relative: 1.7 %
HCT: 40.1 % (ref 35.0–45.0)
Hemoglobin: 13 g/dL (ref 11.7–15.5)
MCH: 26.7 pg — ABNORMAL LOW (ref 27.0–33.0)
MCHC: 32.4 g/dL (ref 32.0–36.0)
MCV: 82.3 fL (ref 80.0–100.0)
MPV: 9.4 fL (ref 7.5–12.5)
Monocytes Relative: 5.7 %
Neutro Abs: 6574 {cells}/uL (ref 1500–7800)
Neutrophils Relative %: 69.2 %
Platelets: 383 10*3/uL (ref 140–400)
RBC: 4.87 10*6/uL (ref 3.80–5.10)
RDW: 12.9 % (ref 11.0–15.0)
Total Lymphocyte: 22.7 %
WBC: 9.5 10*3/uL (ref 3.8–10.8)

## 2023-06-22 LAB — QUANTIFERON-TB GOLD PLUS
Mitogen-NIL: 8.23 [IU]/mL
NIL: 0.01 [IU]/mL
QuantiFERON-TB Gold Plus: NEGATIVE
TB1-NIL: 0.22 [IU]/mL
TB2-NIL: 0.22 [IU]/mL

## 2023-06-22 LAB — COMPLETE METABOLIC PANEL WITHOUT GFR
AG Ratio: 1.4 (calc) (ref 1.0–2.5)
ALT: 13 U/L (ref 6–29)
AST: 15 U/L (ref 10–35)
Albumin: 4.3 g/dL (ref 3.6–5.1)
Alkaline phosphatase (APISO): 85 U/L (ref 31–125)
BUN: 19 mg/dL (ref 7–25)
CO2: 32 mmol/L (ref 20–32)
Calcium: 9.8 mg/dL (ref 8.6–10.2)
Chloride: 99 mmol/L (ref 98–110)
Creat: 0.66 mg/dL (ref 0.50–0.99)
Globulin: 3 g/dL (ref 1.9–3.7)
Glucose, Bld: 93 mg/dL (ref 65–99)
Potassium: 4.1 mmol/L (ref 3.5–5.3)
Sodium: 139 mmol/L (ref 135–146)
Total Bilirubin: 0.4 mg/dL (ref 0.2–1.2)
Total Protein: 7.3 g/dL (ref 6.1–8.1)

## 2023-06-22 LAB — HEPATITIS C ANTIBODY: Hepatitis C Ab: NONREACTIVE

## 2023-06-22 LAB — HEPATITIS B SURFACE ANTIBODY,QUALITATIVE: Hep B S Ab: REACTIVE — AB

## 2023-06-22 LAB — HEPATITIS B CORE ANTIBODY, TOTAL: Hep B Core Total Ab: NONREACTIVE

## 2023-06-22 LAB — HIV ANTIBODY (ROUTINE TESTING W REFLEX): HIV 1&2 Ab, 4th Generation: NONREACTIVE

## 2023-06-22 LAB — HEPATITIS B SURFACE ANTIGEN: Hepatitis B Surface Ag: NONREACTIVE

## 2023-06-22 LAB — HEPATITIS A ANTIBODY, TOTAL: Hepatitis A AB,Total: NONREACTIVE

## 2023-06-25 ENCOUNTER — Other Ambulatory Visit (HOSPITAL_BASED_OUTPATIENT_CLINIC_OR_DEPARTMENT_OTHER): Payer: Self-pay

## 2023-06-26 DIAGNOSIS — H524 Presbyopia: Secondary | ICD-10-CM | POA: Diagnosis not present

## 2023-06-26 DIAGNOSIS — H52221 Regular astigmatism, right eye: Secondary | ICD-10-CM | POA: Diagnosis not present

## 2023-07-09 ENCOUNTER — Ambulatory Visit: Payer: Self-pay | Admitting: Internal Medicine

## 2023-07-10 ENCOUNTER — Other Ambulatory Visit: Payer: Self-pay

## 2023-07-11 ENCOUNTER — Other Ambulatory Visit: Payer: Self-pay

## 2023-07-11 NOTE — Progress Notes (Signed)
 Tremfya  device now switched to Tremfya  OnePress. Patient must be counseled prior to next fill.

## 2023-07-12 ENCOUNTER — Other Ambulatory Visit (HOSPITAL_COMMUNITY): Payer: Self-pay

## 2023-07-13 ENCOUNTER — Other Ambulatory Visit: Payer: Self-pay

## 2023-07-24 ENCOUNTER — Ambulatory Visit: Admitting: Internal Medicine

## 2023-08-07 ENCOUNTER — Other Ambulatory Visit: Payer: Self-pay

## 2023-08-08 ENCOUNTER — Other Ambulatory Visit: Payer: Self-pay

## 2023-08-08 ENCOUNTER — Encounter (INDEPENDENT_AMBULATORY_CARE_PROVIDER_SITE_OTHER): Payer: Self-pay

## 2023-08-09 ENCOUNTER — Other Ambulatory Visit: Payer: Self-pay

## 2023-08-09 ENCOUNTER — Other Ambulatory Visit (HOSPITAL_COMMUNITY): Payer: Self-pay

## 2023-08-09 NOTE — Progress Notes (Signed)
 Specialty Pharmacy Refill Coordination Note  MyChart Questionnaire Submission  [name] is a 48 y.o. female contacted today regarding refills of specialty medication(s) Tremfya .  Injection date: 08/15/23.   Patient requested: (Patient-Rptd) Delivery   Delivery date: 08/14/23   Verified address: (Patient-Rptd) [address] [place], [place] [address]  Medication will be filled on 08/13/23.  This fill date is pending response to refill request from provider. Patient is aware and if they have not received fill by intended date, they must follow up with pharmacy.

## 2023-08-10 ENCOUNTER — Other Ambulatory Visit: Payer: Self-pay

## 2023-08-10 ENCOUNTER — Other Ambulatory Visit (HOSPITAL_COMMUNITY): Payer: Self-pay

## 2023-08-10 MED ORDER — TREMFYA ONE-PRESS 100 MG/ML ~~LOC~~ SOAJ
SUBCUTANEOUS | 4 refills | Status: DC
  Filled 2023-08-10: qty 1, 56d supply, fill #0

## 2023-08-13 ENCOUNTER — Other Ambulatory Visit (HOSPITAL_COMMUNITY): Payer: Self-pay

## 2023-08-28 ENCOUNTER — Other Ambulatory Visit (HOSPITAL_COMMUNITY): Payer: Self-pay

## 2023-09-03 ENCOUNTER — Other Ambulatory Visit: Payer: Self-pay | Admitting: Pharmacist

## 2023-09-03 ENCOUNTER — Other Ambulatory Visit: Payer: Self-pay

## 2023-09-03 MED ORDER — GUSELKUMAB 100 MG/ML ~~LOC~~ SOAJ
SUBCUTANEOUS | 4 refills | Status: DC
  Filled 2023-09-03: qty 1, fill #0
  Filled 2023-09-27: qty 1, 56d supply, fill #0
  Filled 2023-11-21: qty 1, 56d supply, fill #1

## 2023-09-18 ENCOUNTER — Other Ambulatory Visit: Payer: Self-pay

## 2023-09-27 ENCOUNTER — Other Ambulatory Visit: Payer: Self-pay

## 2023-09-27 NOTE — Progress Notes (Signed)
 Specialty Pharmacy Ongoing Clinical Assessment Note  [name] is a 48 y.o. female who is being followed by the specialty pharmacy service for RxSp Psoriasis   Patient's specialty medication(s) reviewed today: Guselkumab  (Tremfya  One-Press)   Missed doses in the last 4 weeks: 0   Patient/Caregiver did not have any additional questions or concerns.   Therapeutic benefit summary: Patient is achieving benefit   Adverse events/side effects summary: No adverse events/side effects   Patient's therapy is appropriate to: Continue    Goals Addressed             This Visit's Progress    Minimize recurrence of flares   On track    Patient is on track. Patient will maintain adherence.  Patient reports that she is well-controlled at this time. She stated she had a small flare up before her last injection, but attributes it to stress/weather. It was very manageable and she has had no issues since.         Follow up: 12 months  [facility]

## 2023-09-27 NOTE — Progress Notes (Signed)
 Specialty Pharmacy Refill Coordination Note  [name] is a 48 y.o. female contacted today regarding refills of specialty medication(s) Guselkumab  (Tremfya  One-Press)   Patient requested Delivery   Delivery date: 10/02/23   Verified address: [address], [place] [address]   Medication will be filled on 10/01/23.

## 2023-10-01 ENCOUNTER — Other Ambulatory Visit: Payer: Self-pay

## 2023-11-07 ENCOUNTER — Other Ambulatory Visit (HOSPITAL_BASED_OUTPATIENT_CLINIC_OR_DEPARTMENT_OTHER): Payer: Self-pay

## 2023-11-07 ENCOUNTER — Encounter: Payer: Self-pay | Admitting: Sports Medicine

## 2023-11-07 ENCOUNTER — Ambulatory Visit (INDEPENDENT_AMBULATORY_CARE_PROVIDER_SITE_OTHER): Admitting: Sports Medicine

## 2023-11-07 VITALS — BP 102/78 | Ht 70.0 in | Wt 210.0 lb

## 2023-11-07 DIAGNOSIS — M7061 Trochanteric bursitis, right hip: Secondary | ICD-10-CM

## 2023-11-07 MED ORDER — METHYLPREDNISOLONE ACETATE 40 MG/ML IJ SUSP
40.0000 mg | Freq: Once | INTRAMUSCULAR | Status: AC
  Administered 2023-11-07: 40 mg via INTRA_ARTICULAR

## 2023-11-07 MED ORDER — NAPROXEN 500 MG PO TABS
500.0000 mg | ORAL_TABLET | Freq: Two times a day (BID) | ORAL | 0 refills | Status: AC
  Filled 2023-11-07: qty 60, 30d supply, fill #0

## 2023-11-07 NOTE — Progress Notes (Signed)
   Subjective:    Patient ID: [name], female    DOB: January 22, 1976, 48 y.o.   MRN: [id number]  HPI chief complaint: Right hip pain  Patient is a very pleasant 48 year old female that presents today with approximately 1 week of lateral right hip pain.  She does not recall any trauma but does remember her pain starting after she was on her feet quite a bit for couple of days leading up to her onset of pain.  Pain will radiate down the right leg to the knee.  Nothing past the knee.  Worse with activity.  Difficulty sleeping at night as well.  She has tried meloxicam  without any relief.  She denies groin pain.  No prior hip problems in the past.  Past medical history reviewed Medications reviewed Allergies reviewed  Review of Systems As above    Objective:   Physical Exam  Well-developed, well-nourished.  No acute distress  Right hip: Smooth painless hip range of motion with negative logroll.  She is tender to palpation directly over the greater trochanter.  No significant tenderness along the gluteus medius tendon insertion.  Neurovascularly intact distally.      Assessment & Plan:   Right knee pain secondary to greater trochanteric bursitis  Patient's greater trochanter bursa is injected today with cortisone.  This is accomplished atraumatically under sterile technique.  She tolerates this without difficulty.  We will trial Naprosyn  500 mg twice daily and [facility]  We will schedule a tentative follow-up visit with Dr. Charles in 2 weeks.  He may want to consider an ultrasound-guided injection of this area if she does not notice relief with today's landmark guided injection.  Consent obtained and verified. Time-out conducted. Noted no overlying erythema, induration, or other signs of local infection. Skin prepped in a sterile fashion. Topical analgesic spray: Ethyl chloride. Joint: Left lateral hip Needle: 25-gauge 1.5 inch Completed without  difficulty. Meds: 3 cc 1% Xylocaine , 1 cc (40 mg) Depo-Medrol   This note was dictated using Dragon naturally speaking software and may contain errors in syntax, spelling, or content which have not been identified prior to signing this note.

## 2023-11-09 DIAGNOSIS — M25559 Pain in unspecified hip: Secondary | ICD-10-CM | POA: Diagnosis not present

## 2023-11-13 DIAGNOSIS — M25559 Pain in unspecified hip: Secondary | ICD-10-CM | POA: Diagnosis not present

## 2023-11-19 ENCOUNTER — Other Ambulatory Visit: Payer: Self-pay

## 2023-11-20 ENCOUNTER — Encounter (INDEPENDENT_AMBULATORY_CARE_PROVIDER_SITE_OTHER): Payer: Self-pay

## 2023-11-20 DIAGNOSIS — M25559 Pain in unspecified hip: Secondary | ICD-10-CM | POA: Diagnosis not present

## 2023-11-20 DIAGNOSIS — F4312 Post-traumatic stress disorder, chronic: Secondary | ICD-10-CM | POA: Diagnosis not present

## 2023-11-21 ENCOUNTER — Ambulatory Visit

## 2023-11-21 ENCOUNTER — Other Ambulatory Visit: Payer: Self-pay | Admitting: Pharmacy Technician

## 2023-11-21 ENCOUNTER — Other Ambulatory Visit: Payer: Self-pay

## 2023-11-21 NOTE — Progress Notes (Signed)
 Specialty Pharmacy Refill Coordination Note  [name] is a 48 y.o. female contacted today regarding refills of specialty medication(s) Guselkumab  (Tremfya  One-Press)   Patient requested (Patient-Rptd) Delivery   Delivery date: 12/04/23  Verified address: (Patient-Rptd) [address], [place], [place] [address]   Medication will be filled on 12/03/23.

## 2023-11-23 ENCOUNTER — Other Ambulatory Visit (HOSPITAL_BASED_OUTPATIENT_CLINIC_OR_DEPARTMENT_OTHER): Payer: Self-pay

## 2023-11-23 DIAGNOSIS — M25559 Pain in unspecified hip: Secondary | ICD-10-CM | POA: Diagnosis not present

## 2023-11-23 MED ORDER — FLUZONE 0.5 ML IM SUSY
0.5000 mL | PREFILLED_SYRINGE | Freq: Once | INTRAMUSCULAR | 0 refills | Status: AC
Start: 2023-11-23 — End: 2023-11-24
  Filled 2023-11-23: qty 0.5, 1d supply, fill #0

## 2023-11-26 DIAGNOSIS — M25559 Pain in unspecified hip: Secondary | ICD-10-CM | POA: Diagnosis not present

## 2023-11-27 DIAGNOSIS — F4312 Post-traumatic stress disorder, chronic: Secondary | ICD-10-CM | POA: Diagnosis not present

## 2023-11-28 DIAGNOSIS — M25559 Pain in unspecified hip: Secondary | ICD-10-CM | POA: Diagnosis not present

## 2023-12-03 ENCOUNTER — Other Ambulatory Visit: Payer: Self-pay

## 2023-12-03 MED ORDER — TREMFYA ONE-PRESS 100 MG/ML ~~LOC~~ SOPN
PEN_INJECTOR | SUBCUTANEOUS | 3 refills | Status: AC
  Filled 2023-12-03: qty 1, 56d supply, fill #0
  Filled 2024-01-21: qty 1, 56d supply, fill #1
  Filled 2024-03-18: qty 1, 56d supply, fill #2

## 2023-12-04 DIAGNOSIS — F4312 Post-traumatic stress disorder, chronic: Secondary | ICD-10-CM | POA: Diagnosis not present

## 2023-12-06 ENCOUNTER — Other Ambulatory Visit: Payer: Self-pay

## 2023-12-12 ENCOUNTER — Other Ambulatory Visit: Payer: Self-pay

## 2023-12-13 DIAGNOSIS — M25559 Pain in unspecified hip: Secondary | ICD-10-CM | POA: Diagnosis not present

## 2023-12-15 DIAGNOSIS — M25559 Pain in unspecified hip: Secondary | ICD-10-CM | POA: Diagnosis not present

## 2023-12-18 DIAGNOSIS — F4312 Post-traumatic stress disorder, chronic: Secondary | ICD-10-CM | POA: Diagnosis not present

## 2023-12-20 DIAGNOSIS — M25559 Pain in unspecified hip: Secondary | ICD-10-CM | POA: Diagnosis not present

## 2023-12-21 ENCOUNTER — Other Ambulatory Visit: Payer: Self-pay

## 2023-12-21 ENCOUNTER — Telehealth: Payer: Self-pay

## 2023-12-21 NOTE — Telephone Encounter (Signed)
 Pharmacy Patient Advocate Encounter   Received notification from Patient Pharmacy that prior authorization for Tremfya  is required/requested.   Insurance verification completed.   The patient is insured through [facility].   Per test claim: PA required; PA submitted to above mentioned insurance via Latent Key/confirmation #/EOC [id number] Status is pending

## 2023-12-24 NOTE — Telephone Encounter (Signed)
 Pharmacy Patient Advocate Encounter  Received notification from [facility] that Prior Authorization for Tremfya  has been APPROVED from 12/29/23 to 12/27/24   PA #/Case ID/Reference #: [id number]

## 2023-12-25 DIAGNOSIS — F4312 Post-traumatic stress disorder, chronic: Secondary | ICD-10-CM | POA: Diagnosis not present

## 2023-12-31 DIAGNOSIS — E559 Vitamin D deficiency, unspecified: Secondary | ICD-10-CM | POA: Diagnosis not present

## 2023-12-31 DIAGNOSIS — D510 Vitamin B12 deficiency anemia due to intrinsic factor deficiency: Secondary | ICD-10-CM | POA: Diagnosis not present

## 2023-12-31 DIAGNOSIS — E349 Endocrine disorder, unspecified: Secondary | ICD-10-CM | POA: Diagnosis not present

## 2023-12-31 DIAGNOSIS — Z7985 Long-term (current) use of injectable non-insulin antidiabetic drugs: Secondary | ICD-10-CM | POA: Diagnosis not present

## 2023-12-31 DIAGNOSIS — N959 Unspecified menopausal and perimenopausal disorder: Secondary | ICD-10-CM | POA: Diagnosis not present

## 2023-12-31 DIAGNOSIS — R871 Abnormal level of hormones in specimens from female genital organs: Secondary | ICD-10-CM | POA: Diagnosis not present

## 2024-01-02 DIAGNOSIS — F4312 Post-traumatic stress disorder, chronic: Secondary | ICD-10-CM | POA: Diagnosis not present

## 2024-01-04 DIAGNOSIS — M25559 Pain in unspecified hip: Secondary | ICD-10-CM | POA: Diagnosis not present

## 2024-01-08 ENCOUNTER — Other Ambulatory Visit (HOSPITAL_BASED_OUTPATIENT_CLINIC_OR_DEPARTMENT_OTHER): Payer: Self-pay

## 2024-01-08 DIAGNOSIS — M25559 Pain in unspecified hip: Secondary | ICD-10-CM | POA: Diagnosis not present

## 2024-01-08 DIAGNOSIS — F4312 Post-traumatic stress disorder, chronic: Secondary | ICD-10-CM | POA: Diagnosis not present

## 2024-01-14 ENCOUNTER — Other Ambulatory Visit (HOSPITAL_BASED_OUTPATIENT_CLINIC_OR_DEPARTMENT_OTHER): Payer: Self-pay

## 2024-01-14 MED ORDER — HYDROCHLOROTHIAZIDE 25 MG PO TABS
25.0000 mg | ORAL_TABLET | Freq: Every day | ORAL | 0 refills | Status: DC
  Filled 2024-01-14: qty 90, 90d supply, fill #0

## 2024-01-15 DIAGNOSIS — F4312 Post-traumatic stress disorder, chronic: Secondary | ICD-10-CM | POA: Diagnosis not present

## 2024-01-21 ENCOUNTER — Other Ambulatory Visit (HOSPITAL_COMMUNITY): Payer: Self-pay

## 2024-01-21 ENCOUNTER — Other Ambulatory Visit: Payer: Self-pay

## 2024-01-21 ENCOUNTER — Other Ambulatory Visit: Payer: Self-pay | Admitting: Pharmacy Technician

## 2024-01-21 NOTE — Progress Notes (Signed)
 Specialty Pharmacy Refill Coordination Note  [name] is a 48 y.o. female contacted today regarding refills of specialty medication(s) Guselkumab  (Tremfya  One-Press)   Patient requested (Patient-Rptd) Delivery   Delivery date: 01/30/2024 Verified address: (Patient-Rptd) [address], [place], [place] [address]   Medication will be filled on: 01/29/2024

## 2024-01-29 ENCOUNTER — Other Ambulatory Visit: Payer: Self-pay

## 2024-01-29 DIAGNOSIS — F4312 Post-traumatic stress disorder, chronic: Secondary | ICD-10-CM | POA: Diagnosis not present

## 2024-02-04 DIAGNOSIS — M25559 Pain in unspecified hip: Secondary | ICD-10-CM | POA: Diagnosis not present

## 2024-02-05 ENCOUNTER — Ambulatory Visit: Admitting: Cardiology

## 2024-02-05 DIAGNOSIS — F4312 Post-traumatic stress disorder, chronic: Secondary | ICD-10-CM | POA: Diagnosis not present

## 2024-02-06 DIAGNOSIS — Z01419 Encounter for gynecological examination (general) (routine) without abnormal findings: Secondary | ICD-10-CM | POA: Diagnosis not present

## 2024-02-06 DIAGNOSIS — Z6829 Body mass index (BMI) 29.0-29.9, adult: Secondary | ICD-10-CM | POA: Diagnosis not present

## 2024-02-06 DIAGNOSIS — N939 Abnormal uterine and vaginal bleeding, unspecified: Secondary | ICD-10-CM | POA: Diagnosis not present

## 2024-02-06 DIAGNOSIS — Z1231 Encounter for screening mammogram for malignant neoplasm of breast: Secondary | ICD-10-CM | POA: Diagnosis not present

## 2024-02-27 ENCOUNTER — Other Ambulatory Visit (HOSPITAL_COMMUNITY)

## 2024-02-27 ENCOUNTER — Ambulatory Visit: Admitting: Cardiology

## 2024-02-27 ENCOUNTER — Encounter: Payer: Self-pay | Admitting: Cardiology

## 2024-02-27 VITALS — BP 111/76 | HR 62 | Resp 16 | Ht 70.0 in | Wt 194.2 lb

## 2024-02-27 DIAGNOSIS — I10 Essential (primary) hypertension: Secondary | ICD-10-CM

## 2024-02-27 DIAGNOSIS — R0602 Shortness of breath: Secondary | ICD-10-CM

## 2024-02-27 DIAGNOSIS — R072 Precordial pain: Secondary | ICD-10-CM | POA: Diagnosis not present

## 2024-02-27 NOTE — Patient Instructions (Addendum)
 Medication Instructions:  Your physician recommends that you continue on your current medications as directed. Please refer to the Current Medication list given to you today.  *If you need a refill on your cardiac medications before your next appointment, please call your pharmacy*  Lab Work: None ordered  If you have labs (blood work) drawn today and your tests are completely normal, you will receive your results only by: MyChart Message (if you have MyChart) OR A paper copy in the mail If you have any lab test that is abnormal or we need to change your treatment, we will call you to review the results.  Testing/Procedures: Echocardiogram  Calcium Score CT  GXT  Follow-Up: At [facility], you and your health needs are our priority.  As part of our continuing mission to provide you with exceptional heart care, our providers are all part of one team.  This team includes your primary Cardiologist (physician) and Advanced Practice Providers or APPs (Physician Assistants and Nurse Practitioners) who all work together to provide you with the care you need, when you need it.  Your next appointment:   1 year(s)  Provider:   Madonna Large, DO    We recommend signing up for the patient portal called MyChart.  Sign up information is provided on this After Visit Summary.  MyChart is used to connect with patients for Virtual Visits (Telemedicine).  Patients are able to view lab/test results, encounter notes, upcoming appointments, etc.  Non-urgent messages can be sent to your provider as well.   To learn more about what you can do with MyChart, go to [email or web address].   Other Instructions Your physician has requested that you have an echocardiogram. Echocardiography is a painless test that uses sound waves to create images of your heart. It provides your doctor with information about the size and shape of your heart and how well your hearts chambers and valves are working. This  procedure takes approximately one hour. There are no restrictions for this procedure. Please do NOT wear cologne, perfume, aftershave, or lotions (deodorant is allowed). Please arrive 15 minutes prior to your appointment time.  Please note: We ask at that you not bring children with you during ultrasound (echo/ vascular) testing. Due to room size and safety concerns, children are not allowed in the ultrasound rooms during exams. Our front office staff cannot provide observation of children in our lobby area while testing is being conducted. An adult accompanying a patient to their appointment will only be allowed in the ultrasound room at the discretion of the ultrasound technician under special circumstances. We apologize for any inconvenience.   Your physician has requested that you have a coronary calcium score performed. This is not covered by insurance and will be an out-of-pocket cost of approximately $99.  - DO NOT EAT, DRINK, OR USE TOBACCO PRODUCTS WITHIN 4 HOURS OF THE TEST - DRESS PREPARED TO EXERCISE IN A COMFORTABLE, 2 PIECE CLOTHING OUTFIT AND WALKING SHOES - BRING ANY PRESCRIPTION MEDICATIONS WITH YOU  - NOTIFY THE OFFICE 24 HOURS IN ADVANCE IF YOU NEED TO CANCEL - CALL THE OFFICE [phone] IF YOU HAVE ANY QUESTIONS

## 2024-02-27 NOTE — Progress Notes (Signed)
 "   Cardiology Office Note:    Date:  02/27/2024  NAME:  [name]    MRN: [id number] DOB:  [birth year]   PCP:  Watt Harlene BROCKS, MD  Former Cardiology Providers: N/A Primary Cardiologist:  Madonna Large, DO, [facility] (established care 02/27/2024) Electrophysiologist:  None   Referring MD: Watt Harlene BROCKS, MD  Reason of Consult: Self-referral for hypertension and elevated CRP  Chief Complaint  Patient presents with   Elevated C-reactive protein   Chest Pain    History of Present Illness:    [name] is a 49 y.o. Caucasian female whose past medical history and cardiovascular risk factors includes: Hypertension, family history of heart disease.   She was referred by Bari Search, an integrative nurse practitioner, for cardiac evaluation due to elevated C-reactive protein levels and family history of heart disease.  Elevated C-reactive protein was found on routine labs with her integrative nurse practitioner, with CRP 7.68 mg/L in November 2025. She has a strong family history of heart disease and cardiomyopathy, including her father who died suddenly at age 17 from cardiomyopathy.  She has chest tightness about once a month, lasting for hours in the mid-chest without radiation. It resolves spontaneously and is not triggered by exertion or relieved by rest. She also has intermittent shortness of breath, noted by her husband as frequent deep sighing.  In retrospect patient states that she is more concerned with regards to her dyspnea and precordial pain.  She has hypertension, previously treated with hydrochlorothiazide . She has lost about 40 pounds and believes this has improved her blood pressure.  Family history is notable for her father and paternal grandfather both dying at age 11.  Patient's father did not have premature coronary disease but did have cardiomyopathy of unspecified origin.   She does not smoke, use recreational drugs, and rarely drinks alcohol. She lives  with high ongoing stress related to caregiving responsibilities for her three children, including a son with profound special needs and epilepsy.  Current Medications: Active Medications[1]   Allergies:    Topamax [topiramate]   Past Medical History: Past Medical History:  Diagnosis Date   Abnormal EKG    worked up  with  echo 04-02-2019,sr decreasing r wave progression   Abnormal Pap smear of cervix    Anemia    last iron  infusion 12-22-2020   Anxiety    Chicken pox    as child   Depression    Flu    Headache(784.0)    History of COVID-19 02/19/2019   IBS (irritable bowel syndrome)    Migraines 11/01/2020   none recent   Placenta previa 02/20/2010   with current pregnancy-type and cross 2 Units per protocol   PONV (postoperative nausea and vomiting)    likes scopolamine  patch   Psoriasis 11/01/2020   Strep throat    Syringomyelia, acquired (HCC)    Wears glasses 11/01/2020    Past Surgical History: Past Surgical History:  Procedure Laterality Date   APPENDECTOMY  02/21/2003   laparoscopic   arthroscopic knee Left 02/21/1988   CESAREAN SECTION  12/27/2010   Procedure: CESAREAN SECTION;  Surgeon: Lynwood FORBES Clubs II;  Location: [facility];  Service: Gynecology;  Laterality: N/A;   CESAREAN SECTION N/A 02/03/2015   Procedure: CESAREAN SECTION;  Surgeon: Lynwood Clubs, MD;  Location: [facility];  Service: Obstetrics;  Laterality: N/A;   left breast reduction  02/20/1994   TUBAL LIGATION Bilateral 02/03/2015   Procedure: BILATERAL TUBAL LIGATION;  Surgeon:  Lynwood Clubs, MD;  Location: [facility];  Service: Obstetrics;  Laterality: Bilateral;   WISDOM TOOTH EXTRACTION  2001    Social History: Social History[2]  Family History: Family History  Problem Relation Age of Onset   High blood pressure Mother    Arthritis Mother    Hypertension Mother    Other Mother        Multisystem atrophy   Clotting disorder Mother    Irritable bowel syndrome Mother    Alcohol abuse Father     Arthritis Father    Colon polyps Father    High blood pressure Brother    Prostate cancer Maternal Grandfather    Colon cancer Maternal Uncle    Cardiomyopathy Other    Parkinson's disease Other    Skin cancer Other    Stroke Other    Stomach cancer Neg Hx     ROS:   Review of Systems  Cardiovascular:  Positive for chest pain (see HPI). Negative for claudication, irregular heartbeat, leg swelling, near-syncope, orthopnea, palpitations, paroxysmal nocturnal dyspnea and syncope.  Respiratory:  Positive for shortness of breath.   Hematologic/Lymphatic: Negative for bleeding problem.    Studies Reviewed:   EKG: EKG Interpretation Date/Time:  Wednesday February 27 2024 10:42:29 EST Ventricular Rate:  67 PR Interval:  178 QRS Duration:  80 QT Interval:  440 QTC Calculation: 464 R Axis:   -1  Text Interpretation: Normal sinus rhythm with sinus arrhythmia Low voltage QRS Nonspecific T wave abnormality When compared with ECG of 10-Feb-2017 15:34, No significant change since last tracing Confirmed by Michele Richardson [phone]) on 02/27/2024 11:03:40 AM  Echocardiogram: 03/2019: LVEF 60 to 65%, normal diastolic function, right ventricular size and function normal, no significant valvular heart disease, estimated RAP 3 mmHg, no pericardial effusion.   Labs:    Latest Ref Rng & Units 06/20/2023   11:02 AM 08/09/2021    6:54 PM 11/10/2020   12:11 PM  CBC  WBC 3.8 - 10.8 Thousand/uL 9.5  12.0  10.9   Hemoglobin 11.7 - 15.5 g/dL 86.9  86.6  87.2   Hematocrit 35.0 - 45.0 % 40.1  40.0  40.4   Platelets 140 - 400 Thousand/uL 383  339  390        Latest Ref Rng & Units 06/20/2023   11:02 AM 11/28/2021   11:33 AM 08/09/2021    6:54 PM  BMP  Glucose 65 - 99 mg/dL 93  896  94   BUN 7 - 25 mg/dL 19  11  10    Creatinine 0.50 - 0.99 mg/dL 9.33  9.29  9.23   BUN/Creat Ratio 6 - 22 (calc) SEE NOTE:  SEE NOTE:    Sodium 135 - 146 mmol/L 139  140  137   Potassium 3.5 - 5.3 mmol/L 4.1  3.6  4.0    Chloride 98 - 110 mmol/L 99  100  106   CO2 20 - 32 mmol/L 32  31  26   Calcium 8.6 - 10.2 mg/dL 9.8  9.4  9.2       Latest Ref Rng & Units 06/20/2023   11:02 AM 11/28/2021   11:33 AM 08/09/2021    6:54 PM  CMP  Glucose 65 - 99 mg/dL 93  896  94   BUN 7 - 25 mg/dL 19  11  10    Creatinine 0.50 - 0.99 mg/dL 9.33  9.29  9.23   Sodium 135 - 146 mmol/L 139  140  137   Potassium  3.5 - 5.3 mmol/L 4.1  3.6  4.0   Chloride 98 - 110 mmol/L 99  100  106   CO2 20 - 32 mmol/L 32  31  26   Calcium 8.6 - 10.2 mg/dL 9.8  9.4  9.2   Total Protein 6.1 - 8.1 g/dL 7.3  7.2  7.2   Total Bilirubin 0.2 - 1.2 mg/dL 0.4  0.2  0.5   Alkaline Phos 38 - 126 U/L   70   AST 10 - 35 U/L 15  15  20    ALT 6 - 29 U/L 13  18  21      Lab Results  Component Value Date   CHOL 194 11/28/2021   HDL 55 11/28/2021   LDLCALC 105 (H) 11/28/2021   TRIG 227 (H) 11/28/2021   CHOLHDL 3.5 11/28/2021   No results for input(s): LIPOA in the last 8760 hours. No components found for: NTPROBNP No results for input(s): PROBNP in the last 8760 hours. No results for input(s): TSH in the last 8760 hours.          Physical Exam:    Today's Vitals   02/27/24 1042  BP: 111/76  Pulse: 62  Resp: 16  SpO2: 97%  Weight: 194 lb 3.2 oz (88.1 kg)  Height: 5' 10 (1.778 m)   Body mass index is 27.86 kg/m. Wt Readings from Last 3 Encounters:  02/27/24 194 lb 3.2 oz (88.1 kg)  11/07/23 210 lb (95.3 kg)  06/20/23 210 lb (95.3 kg)    Physical Exam  Constitutional: No distress.  hemodynamically stable  Neck: No JVD present.  Cardiovascular: Normal rate, regular rhythm, S1 normal and S2 normal. Exam reveals no gallop, no S3 and no S4.  No murmur heard. Pulmonary/Chest: Effort normal and breath sounds normal. No stridor. She has no wheezes. She has no rales.  Musculoskeletal:        General: No edema.     Cervical back: Neck supple.  Skin: Skin is warm.     Impression & Recommendation(s):  Impression:    ICD-10-CM   1. Precordial pain  R07.2 Exercise Tolerance Test    ECHOCARDIOGRAM COMPLETE    CT CARDIAC SCORING (SELF PAY ONLY)    Cardiac Stress Test: Informed Consent Details: Physician/Practitioner Attestation; Transcribe to consent form and obtain patient signature    2. Shortness of breath  R06.02 Exercise Tolerance Test    ECHOCARDIOGRAM COMPLETE    CT CARDIAC SCORING (SELF PAY ONLY)    Cardiac Stress Test: Informed Consent Details: Physician/Practitioner Attestation; Transcribe to consent form and obtain patient signature    3. Benign hypertension  I10 EKG 12-Lead       Recommendation(s):  Precordial pain Shortness of breath Symptoms of precordial pain are noncardiac However given the duration of symptoms, family history, and risk factors patient is concerned and therefore referred to cardiology for further evaluation and management. EKG is nonischemic Echo will be ordered to evaluate for structural heart disease and left ventricular systolic function. Exercise treadmill stress test to evaluate for functional capacity and exercise-induced arrhythmia/ischemia Coronary artery calcium score for further risk stratification  Benign hypertension Office blood pressures are well-controlled Currently on hydrochlorothiazide .  Lipid profile from [facility] reviewed as part of today's office visit.  LDL is 139 mg/dL, not at goal.  Recommended working on dietary changes and having it reevaluated with PCP.  Will await the results of the coronary calcium score further recommendations.  Currently her 10-year risk of ASCVD is approximately 1%.  Reemphasize importance of stress management to improve her overall cardiovascular care as well.  Orders Placed:  Orders Placed This Encounter  Procedures   CT CARDIAC SCORING (SELF PAY ONLY)    Standing Status:   Future    Expected Date:   03/05/2024    Expiration Date:   02/26/2025    Is patient pregnant?:   No    [facility]   Cardiac Stress Test: Informed Consent Details: Physician/Practitioner Attestation; Transcribe to consent form and obtain patient signature    Physician/Practitioner attestation of informed consent for procedure/surgical case:   I, the physician/practitioner, attest that I have discussed with the patient the benefits, risks, side effects, alternatives, likelihood of achieving goals and potential problems during recovery for the procedure that I have provided informed consent.    Procedure:   Exercise Stress Test    Indication/Reason:   Chest Pain, SOB   Exercise Tolerance Test    Standing Status:   Future    Expected Date:   03/05/2024    Expiration Date:   02/26/2025    Where [facility] performed[facility]    Stress with pharmacologic or treadmill ?:   Treadmill w/ exercise    Is patient able to ambulate on a treadmill?:   Yes   EKG 12-Lead   ECHOCARDIOGRAM COMPLETE    Standing Status:   Future    Expected Date:   03/29/2024    Expiration Date:   02/26/2025    Where should this test be performed:   [facility]    Does the patient weigh less than or greater than 250 lbs?:   Patient weighs less than 250 lbs    Perflutren DEFINITY (image enhancing agent) should be administered unless hypersensitivity or allergy exist:   Administer Perflutren    Reason for exam-Echo:   Chest Pain  R07.9     Final Medication List:   No orders of the defined types were placed in this encounter.   Medications Discontinued During This Encounter  Medication Reason   VITAMIN D  PO Patient Preference   UNABLE TO FIND Patient Preference   OVER THE COUNTER MEDICATION Patient Preference   Fluocinolone  Acetonide Scalp 0.01 % OIL Patient Preference   Ferrous Gluconate-C-Folic Acid  (IRON -C PO) Patient Preference   hydrochlorothiazide  (HYDRODIURIL ) 25 MG tablet Duplicate   Multiple Vitamin (MULTIVITAMIN) tablet Patient Preference   ondansetron  (ZOFRAN -ODT) 4 MG  disintegrating tablet Patient Preference   Rifapentine  150 MG TABS Patient Preference   isoniazid  (NYDRAZID ) 300 MG tablet Patient Preference   meloxicam  (MOBIC ) 15 MG tablet Patient Preference    Current Medications[3]  Consent:   Informed Consent   Shared Decision Making/Informed Consent The risks [chest pain, shortness of breath, cardiac arrhythmias, dizziness, blood pressure fluctuations, myocardial infarction, stroke/transient ischemic attack, and life-threatening complications (estimated to be 1 in 10,000)], benefits (risk stratification, diagnosing coronary artery disease, treatment guidance) and alternatives of an exercise tolerance test were discussed in detail with Ms. Hornbaker and she agrees to proceed.     Disposition:   1 year follow-up sooner if needed Patient may be asked to follow-up sooner based on the results of the above-mentioned testing.  Signed, Madonna Large, DO, [facility]  A Division of [facility] [address], [place], [place] [address]      [1]  Current Meds  Medication Sig   Calcipotriene  0.005 % solution  Apply 1 Application topically daily to scalp.   Cholecalciferol (D3-50) 1.25 MG (50000 UT) capsule Take 1 capsule (50,000 Units total) by mouth once a week.   clobetasol  (TEMOVATE ) 0.05 % external solution Apply 1 (one) application(s) topicallly to affected area 2 (two) times a day.   Guselkumab  (TREMFYA  ONE-PRESS) 100 MG/ML SOPN MAINTENANCE DOSE: inject 1 pen SQ every 8 weeks   hydrochlorothiazide  (HYDRODIURIL ) 25 MG tablet Take 1 tablet (25 mg total) by mouth daily.   naproxen  (NAPROSYN ) 500 MG tablet Take 1 tablet (500 mg total) by mouth 2 (two) times daily with a meal.  [2]  Social History Tobacco Use   Smoking status: Never   Smokeless tobacco: Never  Vaping Use   Vaping status: Never Used  Substance Use Topics   Alcohol use: No    Comment: rarely   Drug use: Never  [3]  Current Outpatient Medications:     Calcipotriene  0.005 % solution, Apply 1 Application topically daily to scalp., Disp: 60 mL, Rfl: 1   Cholecalciferol (D3-50) 1.25 MG (50000 UT) capsule, Take 1 capsule (50,000 Units total) by mouth once a week., Disp: 8 capsule, Rfl: 0   clobetasol  (TEMOVATE ) 0.05 % external solution, Apply 1 (one) application(s) topicallly to affected area 2 (two) times a day., Disp: 50 mL, Rfl: 1   Guselkumab  (TREMFYA  ONE-PRESS) 100 MG/ML SOPN, MAINTENANCE DOSE: inject 1 pen SQ every 8 weeks, Disp: 1 mL, Rfl: 3   hydrochlorothiazide  (HYDRODIURIL ) 25 MG tablet, Take 1 tablet (25 mg total) by mouth daily., Disp: 90 tablet, Rfl: 3   naproxen  (NAPROSYN ) 500 MG tablet, Take 1 tablet (500 mg total) by mouth 2 (two) times daily with a meal., Disp: 60 tablet, Rfl: 0  "

## 2024-03-18 ENCOUNTER — Other Ambulatory Visit: Payer: Self-pay

## 2024-03-18 ENCOUNTER — Encounter (INDEPENDENT_AMBULATORY_CARE_PROVIDER_SITE_OTHER): Payer: Self-pay

## 2024-03-19 ENCOUNTER — Other Ambulatory Visit (HOSPITAL_COMMUNITY): Payer: Self-pay

## 2024-03-19 NOTE — Progress Notes (Signed)
 Specialty Pharmacy Refill Coordination Note  [name] is a 49 y.o. female contacted today regarding refills of specialty medication(s) Guselkumab  (Tremfya  One-Press)   Patient requested Delivery   Delivery date: 03/27/24   Verified address: [address], [place], [place] [address]   Medication will be filled on: 03/26/24

## 2024-03-26 ENCOUNTER — Other Ambulatory Visit: Payer: Self-pay

## 2024-04-10 ENCOUNTER — Ambulatory Visit (HOSPITAL_COMMUNITY): Admit: 2024-04-10 | Admitting: Obstetrics and Gynecology

## 2024-04-10 SURGERY — DILATATION & CURETTAGE/HYSTEROSCOPY WITH RESECTOCOPE
Anesthesia: Choice

## 2024-04-11 ENCOUNTER — Ambulatory Visit (HOSPITAL_COMMUNITY)

## 2024-04-11 ENCOUNTER — Other Ambulatory Visit (HOSPITAL_COMMUNITY)

## 2024-04-11 ENCOUNTER — Encounter (HOSPITAL_COMMUNITY)

## 2024-05-07 ENCOUNTER — Encounter (HOSPITAL_COMMUNITY)

## 2024-05-07 ENCOUNTER — Other Ambulatory Visit (HOSPITAL_COMMUNITY)

## 2024-05-07 ENCOUNTER — Ambulatory Visit (HOSPITAL_COMMUNITY)
